# Patient Record
Sex: Female | Born: 1985 | Race: Black or African American | Hispanic: No | Marital: Single | Smoking: Never smoker
Health system: Southern US, Community
[De-identification: ages and names within clinical notes are randomized; demographics above are authoritative.]

## PROBLEM LIST (undated history)

## (undated) DIAGNOSIS — R7989 Other specified abnormal findings of blood chemistry: Secondary | ICD-10-CM

## (undated) DIAGNOSIS — R6250 Unspecified lack of expected normal physiological development in childhood: Secondary | ICD-10-CM

## (undated) DIAGNOSIS — R269 Unspecified abnormalities of gait and mobility: Secondary | ICD-10-CM

## (undated) DIAGNOSIS — Z8673 Personal history of transient ischemic attack (TIA), and cerebral infarction without residual deficits: Secondary | ICD-10-CM

## (undated) DIAGNOSIS — R569 Unspecified convulsions: Secondary | ICD-10-CM

## (undated) HISTORY — DX: Other specified abnormal findings of blood chemistry: R79.89

## (undated) HISTORY — PX: BRAIN SURGERY: SHX531

## (undated) HISTORY — PX: OTHER SURGICAL HISTORY: SHX169

## (undated) HISTORY — DX: Unspecified abnormalities of gait and mobility: R26.9

---

## 2007-03-22 ENCOUNTER — Encounter: Admission: RE | Admit: 2007-03-22 | Discharge: 2007-03-22 | Payer: Self-pay | Admitting: Neurology

## 2010-12-30 ENCOUNTER — Other Ambulatory Visit (HOSPITAL_COMMUNITY)
Admission: RE | Admit: 2010-12-30 | Discharge: 2010-12-30 | Disposition: A | Discharge status: Home / Self Care | Payer: Medicaid Other | Source: Ambulatory Visit | Attending: Obstetrics and Gynecology | Admitting: Obstetrics and Gynecology

## 2010-12-30 DIAGNOSIS — Z01419 Encounter for gynecological examination (general) (routine) without abnormal findings: Secondary | ICD-10-CM | POA: Insufficient documentation

## 2011-04-21 ENCOUNTER — Encounter (HOSPITAL_COMMUNITY): Payer: Self-pay | Admitting: *Deleted

## 2011-04-21 DIAGNOSIS — W57XXXA Bitten or stung by nonvenomous insect and other nonvenomous arthropods, initial encounter: Secondary | ICD-10-CM | POA: Insufficient documentation

## 2011-04-21 DIAGNOSIS — IMO0001 Reserved for inherently not codable concepts without codable children: Secondary | ICD-10-CM | POA: Insufficient documentation

## 2011-04-21 DIAGNOSIS — S40269A Insect bite (nonvenomous) of unspecified shoulder, initial encounter: Secondary | ICD-10-CM | POA: Insufficient documentation

## 2011-04-21 NOTE — ED Notes (Signed)
Removed a tick,

## 2011-04-22 ENCOUNTER — Emergency Department (HOSPITAL_COMMUNITY)
Admission: EM | Admit: 2011-04-22 | Discharge: 2011-04-22 | Disposition: A | Discharge status: Home / Self Care | Payer: Medicaid Other | Attending: Emergency Medicine | Admitting: Emergency Medicine

## 2011-04-22 DIAGNOSIS — W57XXXA Bitten or stung by nonvenomous insect and other nonvenomous arthropods, initial encounter: Secondary | ICD-10-CM

## 2011-04-22 HISTORY — DX: Unspecified convulsions: R56.9

## 2011-04-22 NOTE — Discharge Instructions (Signed)
Put a warm compress on the sore areas 3 times a day; and clean the area well with soap and water at least twice a day. Watch for fever within 2 weeks or rash that develops around the sore area. See a doctor if you have concerns or problems   Wood Tick Bite Ticks are insects that attach themselves to the skin. Most tick bites are harmless, but sometimes ticks carry diseases that can make a person quite ill. The chance of getting ill depends on:  The kind of tick that bites you.   Time of year.   How long the tick is attached.   Geographic location.  Wood ticks are also called dog ticks. They are generally black. They can have white markings. They live in shrubs and grassy areas. They are larger than deer ticks. Wood ticks are about the size of a watermelon seed. They have a hard body. The most common places for ticks to attach themselves are the scalp, neck, armpits, waist, and groin. Wood ticks may stay attached for up to 2 weeks. TICKS MUST BE REMOVED AS SOON AS POSSIBLE TO HELP PREVENT DISEASES CAUSED BY TICK BITES.  TO REMOVE A TICK: 1. If available, put on latex gloves before trying to remove a tick.  2. Grasp the tick as close to the skin as possible, with curved forceps, fine tweezers or a special tick removal tool.  3. Pull gently with steady pressure until the tick lets go. Do not twist the tick or jerk it suddenly. This may break off the tick's head or mouth parts.  4. Do not crush the tick's body. This could force disease-carrying fluids from the tick into your body.  5. After the tick is removed, wash the bite area and your hands with soap and water or other disinfectant.  6. Apply a small amount of antiseptic cream or ointment to the bite site.  7. Wash and disinfect any instruments that were used.  8. Save the tick in a jar or plastic bag for later identification. Preserve the tick with a bit of alcohol or put it in the freezer.  9. Do not apply a hot match, petroleum jelly, or  fingernail polish to the tick. This does not work and may increase the chances of disease from the tick bite.  YOU MAY NEED TO SEE YOUR CAREGIVER FOR A TETANUS SHOT NOW IF:  You have no idea when you had the last one.   You have never had a tetanus shot before.  If you need a tetanus shot, and you decide not to get one, there is a rare chance of getting tetanus. Sickness from tetanus can be serious. If you get a tetanus shot, your arm may swell, get red and warm to the touch at the shot site. This is common and not a problem. PREVENTION  Wear protective clothing. Long sleeves and pants are best.   Wear white clothes to see ticks more easily   Tuck your pant legs into your socks.   If walking on trail, stay in the middle of the trail to avoid brushing against bushes.   Put insect repellent on all exposed skin and along boot tops, pant legs and sleeve cuffs   Check clothing, hair and skin repeatedly and before coming inside.   Brush off any ticks that are not attached.  SEEK MEDICAL CARE IF:   You cannot remove a tick or part of the tick that is left in the skin.  Unexplained fever.   Redness and swelling in the area of the tick bite.   Tender, swollen lymph glands.   Diarrhea.   Weight loss.   Cough.   Fatigue.   Muscle, joint or bone pain.   Belly pain.   Headache.   Rash.  SEEK IMMEDIATE MEDICAL CARE IF:   You develop an oral temperature above 102 F (38.9 C).   You are having trouble walking or moving your legs.   Numbness in the legs.   Shortness of breath.   Confusion.   Repeated vomiting.  Document Released: 12/28/1999 Document Revised: 12/19/2010 Document Reviewed: 12/06/2007 [facility] Patient Information 2012 [place], [place].

## 2011-04-22 NOTE — ED Provider Notes (Signed)
History     CSN: [id number]  Arrival date & time 04/21/11  2210   None     Chief Complaint  Patient presents with  . Tick Removal    (Consider location/radiation/quality/duration/timing/severity/associated sxs/prior treatment) HPI Comments: Vanessa Zuniga is a 26 y.o. Female who presents with her mother, who pulled a tick from her left axilla with tweezers. They're concerned that there might be a retained. Part of the tick in her axilla. The tick bite was within the last 24 hours. There is no associated fever, rash, nausea, vomiting, weakness, dizziness, or back pain.  The history is provided by the patient and a parent.    Past Medical History  Diagnosis Date  . Seizures     Past Surgical History  Procedure Date  . Brain surgery     History reviewed. No pertinent family history.  History  Substance Use Topics  . Smoking status: Never Smoker   . Smokeless tobacco: Not on file  . Alcohol Use: No    OB History    Grav Para Term Preterm Abortions TAB SAB Ect Mult Living                  Review of Systems  Allergies  Sulfa antibiotics  Home Medications  No current outpatient prescriptions on file.  BP 120/59  Pulse 86  Temp(Src) 98 F (36.7 C) (Oral)  Resp 24  Ht 5\' 3"  (1.6 m)  Wt 149 lb (67.586 kg)  BMI 26.39 kg/m2  SpO2 100%  LMP 04/08/2011  Physical Exam  Constitutional: She is oriented to person, place, and time. She appears well-developed.  HENT:  Head: Normocephalic.  Eyes: Conjunctivae are normal. Pupils are equal, round, and reactive to light.  Neck: Normal range of motion. Neck supple.  Pulmonary/Chest: Effort normal.  Musculoskeletal: Normal range of motion.  Neurological: She is alert and oriented to person, place, and time. No cranial nerve deficit. Coordination normal.  Skin:       Left axilla has a 3 mm excoriated area without associated swelling or rash. Centrally in the excoriated area is a miniscule black material of  nonspecific nature.    ED Course  Procedures (including critical care time)  The patient presented with a wood tick in a baggy that appeared to be fully intact. When viewed through 2 times magnification   Labs Reviewed - No data to display No results found.   1. Tick bite       MDM  Take bite, with embedded insect, removed by family member. No evidence for significant retained foreign body. No indication for treatment with antibiotics at this time.   Plan: Home Medications- none; Home Treatments- warm soaks; Recommended follow up- PCP or here prn        Flint Melter, MD 04/22/11 [phone]

## 2012-11-08 ENCOUNTER — Ambulatory Visit: Payer: Self-pay | Admitting: Nurse Practitioner

## 2012-11-12 ENCOUNTER — Encounter (INDEPENDENT_AMBULATORY_CARE_PROVIDER_SITE_OTHER): Payer: Self-pay

## 2012-11-12 ENCOUNTER — Encounter: Payer: Self-pay | Admitting: Nurse Practitioner

## 2012-11-12 ENCOUNTER — Ambulatory Visit (INDEPENDENT_AMBULATORY_CARE_PROVIDER_SITE_OTHER): Payer: Medicaid Other | Admitting: Nurse Practitioner

## 2012-11-12 VITALS — BP 113/77 | HR 74 | Ht 65.0 in | Wt 153.0 lb

## 2012-11-12 DIAGNOSIS — G40209 Localization-related (focal) (partial) symptomatic epilepsy and epileptic syndromes with complex partial seizures, not intractable, without status epilepticus: Secondary | ICD-10-CM

## 2012-11-12 DIAGNOSIS — Z79899 Other long term (current) drug therapy: Secondary | ICD-10-CM

## 2012-11-12 DIAGNOSIS — Z5181 Encounter for therapeutic drug level monitoring: Secondary | ICD-10-CM | POA: Insufficient documentation

## 2012-11-12 MED ORDER — CARBAMAZEPINE ER 200 MG PO CP12: 200.0000 mg | ORAL_CAPSULE | Freq: Two times a day (BID) | ORAL | Status: DC

## 2012-11-12 MED ORDER — LAMOTRIGINE 100 MG PO TABS: 300.0000 mg | ORAL_TABLET | Freq: Two times a day (BID) | ORAL | Status: DC

## 2012-11-12 NOTE — Progress Notes (Signed)
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 She was last seen by Vanessa Zuniga 11/04/11.  The patient is on Lamictal and Equetro  and she has done quite well with her seizures. No new medical issues have come up since last seen. 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 The patient has not had any seizures in 3 to 4 years per mother.   REVIEW OF SYSTEMS: Full 14 system review of systems performed and notable only for:  Constitutional: N/A  Cardiovascular: N/A  Ear/Nose/Throat: N/A  Skin: N/A  Eyes: N/A  Respiratory: N/A  Gastroitestinal: N/A  Hematology/Lymphatic: N/A  Endocrine: N/A Musculoskeletal:N/A  Allergy/Immunology: N/A  Neurological: N/A Psychiatric: N/A   ALLERGIES: Allergies  Allergen Reactions  . Sulfa Antibiotics     HOME MEDICATIONS: No outpatient prescriptions prior to visit.   No facility-administered medications prior to visit.    PAST MEDICAL HISTORY: Past Medical History  Diagnosis Date  . Seizures     PAST SURGICAL HISTORY: Past Surgical History  Procedure Laterality Date  . Brain surgery      FAMILY HISTORY: History reviewed. No pertinent family history.  SOCIAL HISTORY: History   Social History  . Marital Status: Single    Spouse Name: N/A    Number of Children: 0  . Years of Education: 12   Occupational History  . Not on file.   Social History Main Topics  . Smoking status: Never Smoker   . Smokeless tobacco: Never Used  . Alcohol Use: No  . Drug Use: No  . Sexual Activity: No   Other Topics Concern  . Not on file   Social History Narrative   Patient lives with mother Vanessa Zuniga.   Patient has no children.    Patient as a high school education.   Patient is working.    Patient is single     PHYSICAL EXAM  Filed Vitals:   11/12/12 0939  BP: 113/77  Pulse: 74  Height: 5\' 5"  (1.651 m)  Weight: 153 lb (69.4 kg)   Body mass index is 25.46 kg/(m^2).  Generalized: Well developed, in no acute distress   Neurological examination   Mentation: Alert oriented to time, place, history taking. Follows all commands speech and language fluent  Cranial nerve II-XII: .Pupils were equal round reactive to light extraocular movements were full, visual field were full on confrontational test. Facial sensation and strength were normal. hearing was intact to finger rubbing bilaterally. Uvula tongue midline. head turning and shoulder shrug and were normal and symmetric.Tongue protrusion into cheek strength was normal. Motor: normal bulk and tone, full strength in the BUE, BLE, mild residual right hemiparesis  Coordination: finger-nose-finger, heel-to-shin bilaterally, no dysmetria Reflexes: Brisker on the right as compared to the left Gait and Station: Mild circumduction gait, decreased arm swing bil smooth turning, able to perform tiptoe, and heel walking without difficulty. Gait mildly unsteady  DIAGNOSTIC DATA (LABS, IMAGING, TESTING) -None to review   ASSESSMENT AND PLAN  27 y.o. year old female  has a past medical history of Seizures. here to followup. No seizure activity at 3-4 years  Will check labs today, CBC, CMP, CBZ level Continue Equetro and Lamictal. Will renew F/U yearly and prn Vanessa Zuniga, Vanessa Zuniga, [facility], Vanessa Zuniga  [facility] [address] [place], [place] [address] [phone]

## 2012-11-12 NOTE — Patient Instructions (Signed)
Will check labs today Continue Equetro and Lamictal. Will renew F/U yearly and prn

## 2012-11-12 NOTE — Progress Notes (Signed)
I have read the note, and I agree with the clinical assessment and plan.  WILLIS,CHARLES KEITH   

## 2012-11-13 LAB — COMPREHENSIVE METABOLIC PANEL
AST: 26 IU/L (ref 0–40)
Albumin/Globulin Ratio: 1.6 (ref 1.1–2.5)
Albumin: 4.5 g/dL (ref 3.5–5.5)
Alkaline Phosphatase: 151 IU/L — ABNORMAL HIGH (ref 39–117)
BUN/Creatinine Ratio: 11 (ref 8–20)
BUN: 9 mg/dL (ref 6–20)
CO2: 24 mmol/L (ref 18–29)
Creatinine, Ser: 0.8 mg/dL (ref 0.57–1.00)
GFR calc Af Amer: 116 mL/min/{1.73_m2} (ref >59)
GFR calc non Af Amer: 101 mL/min/{1.73_m2} (ref >59)
Globulin, Total: 2.8 g/dL (ref 1.5–4.5)
Sodium: 140 mmol/L (ref 134–144)
Total Bilirubin: <0.2 mg/dL (ref 0.0–1.2)

## 2012-11-15 ENCOUNTER — Telehealth: Payer: Self-pay | Admitting: Nurse Practitioner

## 2012-11-15 ENCOUNTER — Other Ambulatory Visit: Payer: Self-pay

## 2012-11-15 MED ORDER — CARBAMAZEPINE ER 200 MG PO CP12: 200.0000 mg | ORAL_CAPSULE | Freq: Two times a day (BID) | ORAL | Status: DC

## 2012-11-15 NOTE — Telephone Encounter (Signed)
I returned pharmacy call. They stated that patient is to take one in a.m. and two in p.m. But only got 60 capsules. I will have it resubmitted. Also, I did speak with Vella Redhead, NP who confirmed that patient should be taking one capsule in a.m. and two capsules in p.m.

## 2012-11-15 NOTE — Progress Notes (Signed)
Quick Note:  Shared stable labs with patient thru VM message ______

## 2012-11-19 MED ORDER — CARBAMAZEPINE ER 200 MG PO CP12: ORAL_CAPSULE | ORAL | Status: DC

## 2012-11-19 NOTE — Telephone Encounter (Signed)
I will change how the prescription is written in the computer.

## 2012-11-19 NOTE — Addendum Note (Signed)
Addended by: Stephanie Acre on: 11/19/2012 06:02 PM   Modules accepted: Orders

## 2012-12-15 ENCOUNTER — Telehealth: Payer: Self-pay | Admitting: Nurse Practitioner

## 2012-12-15 NOTE — Telephone Encounter (Signed)
Please advise 

## 2012-12-15 NOTE — Telephone Encounter (Signed)
Pharmacy called want to know if Vanessa Zuniga is signed up for the [facility] program, if not she needs to be signed up so patient will be able to get her medications Equetro 200 mg and Lamictal 100 mg.

## 2012-12-16 ENCOUNTER — Telehealth: Payer: Self-pay | Admitting: Nurse Practitioner

## 2012-12-16 MED ORDER — LAMOTRIGINE 100 MG PO TABS: 300.0000 mg | ORAL_TABLET | Freq: Two times a day (BID) | ORAL | Status: DC

## 2012-12-16 MED ORDER — CARBAMAZEPINE ER 200 MG PO CP12: ORAL_CAPSULE | ORAL | Status: DC

## 2012-12-16 NOTE — Telephone Encounter (Signed)
[facility] calling because script for Conrad Glenarden needs to be rewritten. Please call.

## 2012-12-16 NOTE — Telephone Encounter (Signed)
Carolyn's application is still pending.  Rx's resent under Provider.

## 2012-12-17 MED ORDER — CARBAMAZEPINE ER 200 MG PO CP12: ORAL_CAPSULE | ORAL | Status: DC

## 2013-02-01 ENCOUNTER — Ambulatory Visit (INDEPENDENT_AMBULATORY_CARE_PROVIDER_SITE_OTHER): Payer: Medicaid Other | Admitting: Adult Health

## 2013-02-01 ENCOUNTER — Encounter (INDEPENDENT_AMBULATORY_CARE_PROVIDER_SITE_OTHER): Payer: Self-pay

## 2013-02-01 ENCOUNTER — Encounter: Payer: Self-pay | Admitting: Adult Health

## 2013-02-01 ENCOUNTER — Other Ambulatory Visit (HOSPITAL_COMMUNITY)
Admission: RE | Admit: 2013-02-01 | Discharge: 2013-02-01 | Disposition: A | Discharge status: Home / Self Care | Payer: Medicaid Other | Source: Ambulatory Visit | Attending: Obstetrics and Gynecology | Admitting: Obstetrics and Gynecology

## 2013-02-01 VITALS — BP 120/80 | HR 80 | Ht 64.0 in | Wt 153.0 lb

## 2013-02-01 DIAGNOSIS — Z124 Encounter for screening for malignant neoplasm of cervix: Secondary | ICD-10-CM

## 2013-02-01 DIAGNOSIS — Z01419 Encounter for gynecological examination (general) (routine) without abnormal findings: Secondary | ICD-10-CM | POA: Insufficient documentation

## 2013-02-01 DIAGNOSIS — Z0000 Encounter for general adult medical examination without abnormal findings: Secondary | ICD-10-CM

## 2013-02-01 NOTE — Patient Instructions (Signed)
Physical in 2 years Call prn

## 2013-02-01 NOTE — Progress Notes (Signed)
Patient ID: Vanessa Zuniga, female   DOB: Sep 12, 1985, 28 y.o.   MRN: [id number] History of Present Illness: Vanessa Zuniga is a 28 year old black female in for a pap and physical. Has history of seizures, right side affected, sp brain surgery, doing well.  Current Medications, Allergies, Past Medical History, Past Surgical History, Family History and Social History were reviewed in [employer] record.     Review of Systems: Patient denies any headaches, blurred vision, shortness of breath, chest pain, abdominal pain, problems with bowel movements, urination, or periods, she does not have sex, she uses pads..No joint pain or mood swings.She is still on meds for seizures.    Physical Exam:BP 120/80  Pulse 80  Ht 5\' 4"  (1.626 m)  Wt 153 lb (69.4 kg)  BMI 26.25 kg/m2  LMP 01/04/2013 General:  Well developed, well nourished, no acute distress Skin:  Warm and dry Neck:  Midline trachea, normal thyroid Lungs; Clear to auscultation bilaterally Breast:  No dominant palpable mass, retraction, or nipple discharge Cardiovascular: Regular rate and rhythm Abdomen:  Soft, non tender, no hepatosplenomegaly Pelvic:  External genitalia is normal in appearance.  The vagina is normal in appearance. The cervix is nulliparous, pap performed.  Uterus is felt to be normal size, shape, and contour.  No adnexal masses or tenderness noted. Extremities:  No swelling or varicosities noted,  Psych:  No mood changes, alert and cooperative, seems happy, Mom with her   Impression: Yearly gyn exam History of seizures   Plan: Physical in 2 year Call prn problems

## 2013-11-14 ENCOUNTER — Ambulatory Visit: Payer: Medicaid Other | Admitting: Nurse Practitioner

## 2013-12-19 ENCOUNTER — Other Ambulatory Visit: Payer: Self-pay | Admitting: Neurology

## 2014-01-03 ENCOUNTER — Ambulatory Visit (INDEPENDENT_AMBULATORY_CARE_PROVIDER_SITE_OTHER): Payer: Medicaid Other | Admitting: Nurse Practitioner

## 2014-01-03 ENCOUNTER — Encounter: Payer: Self-pay | Admitting: Nurse Practitioner

## 2014-01-03 VITALS — BP 122/83 | HR 89 | Ht 61.0 in | Wt 155.0 lb

## 2014-01-03 DIAGNOSIS — Z5181 Encounter for therapeutic drug level monitoring: Secondary | ICD-10-CM

## 2014-01-03 DIAGNOSIS — G40209 Localization-related (focal) (partial) symptomatic epilepsy and epileptic syndromes with complex partial seizures, not intractable, without status epilepticus: Secondary | ICD-10-CM

## 2014-01-03 MED ORDER — LAMOTRIGINE 100 MG PO TABS: ORAL_TABLET | ORAL | Status: DC

## 2014-01-03 MED ORDER — CARBAMAZEPINE ER 200 MG PO CP12: ORAL_CAPSULE | ORAL | Status: DC

## 2014-01-03 NOTE — Patient Instructions (Signed)
Check labs today Will refill Lamictal and Equetro for the next year Follow-up yearly and when necessary

## 2014-01-03 NOTE — Progress Notes (Addendum)
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 She was last seen 11/12/12.  The patient is on Lamictal and Equetro and she has done quite well with her seizures. No new medical issues have come up since last seen. 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 The patient had a mild seizure according to the mom back in the summer. Otherwise no seizures in 3 to 4 years per mother.  REVIEW OF SYSTEMS: Full 14 system review of systems performed and notable only for those listed, all others are neg:  Constitutional: N/A  Cardiovascular: N/A  Ear/Nose/Throat: N/A  Skin: N/A  Eyes: N/A  Respiratory: N/A  Gastroitestinal: N/A  Hematology/Lymphatic: N/A  Endocrine: N/A Musculoskeletal:N/A  Allergy/Immunology: N/A  Neurological: seizure Psychiatric: N/A Sleep : NA   ALLERGIES: Allergies  Allergen Reactions  . Sulfa Antibiotics     HOME MEDICATIONS: Outpatient Prescriptions Prior to Visit  Medication Sig Dispense Refill  . EQUETRO 200 MG CP12 12 hr capsule TAKE ONE CAPSULE IN THE MORNING AND TWO CAPSULES IN THE EVENING 90 each 0  . lamoTRIgine (LAMICTAL) 100 MG tablet TAKE 3 TABLETS IN THE MORNING AND 3 TABLETS IN THE EVENING 180 tablet 0  . Multiple Vitamin (MULTIVITAMIN) tablet Take 1 tablet by mouth daily.     No facility-administered medications prior to visit.    PAST MEDICAL HISTORY: Past Medical History  Diagnosis Date  . Seizures     PAST SURGICAL HISTORY: Past Surgical History  Procedure Laterality Date  . Brain surgery      FAMILY HISTORY: Family History  Problem Relation Age of Onset  . Cancer Maternal Aunt     colon   . Asthma Maternal Grandmother   . Diabetes Maternal Grandmother   .  Heart disease Maternal Grandfather   . Cancer Paternal Grandmother     lung    SOCIAL HISTORY: History   Social History  . Marital Status: Single    Spouse Name: N/A    Number of Children: 0  . Years of Education: 12   Occupational History  . Not on file.   Social History Main Topics  . Smoking status: Never Smoker   . Smokeless tobacco: Never Used  . Alcohol Use: No  . Drug Use: No  . Sexual Activity: No   Other Topics Concern  . Not on file   Social History Narrative   Patient lives with mother [name].   Patient has no children.    Patient as a high school education.   Patient is working.    Patient is single   Patient is left handed.   Patient does not drink caffeine.     PHYSICAL EXAM  Filed Vitals:   01/03/14 1526  BP: 122/83  Pulse: 89  Height: 5\' 1"  (1.549 m)  Weight: 155 lb (70.308 kg)   Body mass index is 29.3 kg/(m^2). Generalized: Well developed, in no acute distress   Neurological examination   Mentation: Alert oriented to time, place, history taking. Follows all commands speech and language fluent  Cranial nerve II-XII: .Pupils were equal round reactive to light extraocular movements were full, visual field were full on confrontational test. Facial sensation and strength were normal. hearing was intact to finger rubbing bilaterally. Uvula  tongue midline. head turning and shoulder shrug and were normal and symmetric.Tongue protrusion into cheek strength was normal. Motor: normal bulk and tone, full strength in the BUE, BLE, mild residual right hemiparesis  Coordination: finger-nose-finger, heel-to-shin bilaterally, no dysmetria Reflexes: Brisker on the right as compared to the left Gait and Station: Mild circumduction gait, decreased arm swing bil smooth turning, able to perform tiptoe, and heel walking without difficulty. Gait mildly unsteady DIAGNOSTIC DATA (LABS, IMAGING, TESTING) - I reviewed patient records, labs, notes, testing and  imaging myself where available.      Component Value Date/Time   NA 140 11/12/2012 1025   K 4.1 11/12/2012 1025   CL 96* 11/12/2012 1025   CO2 24 11/12/2012 1025   GLUCOSE 77 11/12/2012 1025   BUN 9 11/12/2012 1025   CREATININE 0.80 11/12/2012 1025   CALCIUM 9.7 11/12/2012 1025   PROT 7.3 11/12/2012 1025   AST 26 11/12/2012 1025   ALT 23 11/12/2012 1025   ALKPHOS 151* 11/12/2012 1025   BILITOT <0.2 11/12/2012 1025   GFRNONAA 101 11/12/2012 1025   GFRAA 116 11/12/2012 1025    ASSESSMENT AND PLAN  28 y.o. year old female  has a past medical history of Seizures. here to follow-up.The patient is a current patient of Dr. Jannifer Franklin  who is out of the office today . This note is sent to the work in doctor.     Check labs today, CBC, CMP, Lamictal and carbamazepine levels Will refill Lamictal and Equetro for the next year Follow-up yearly and when necessary Dennie Bible, [facility], [facility], APRN  [facility] [address] [place], [place] [address] [phone]  Personally examined patient and images, agree with history, physical, neuro exam as stated above. Agree with assessment and plan.   Sarina Ill, MD [facility]

## 2014-01-04 LAB — CBC WITH DIFFERENTIAL/PLATELET
Basophils Absolute: 0 10*3/uL (ref 0.0–0.2)
Basos: 1 %
EOS: 2 %
Eosinophils Absolute: 0.1 10*3/uL (ref 0.0–0.4)
HEMATOCRIT: 36.9 % (ref 34.0–46.6)
HEMOGLOBIN: 11.5 g/dL (ref 11.1–15.9)
IMMATURE GRANULOCYTES: 0 %
Immature Grans (Abs): 0 10*3/uL (ref 0.0–0.1)
LYMPHS ABS: 2.2 10*3/uL (ref 0.7–3.1)
Lymphs: 34 %
MCH: 26.4 pg — ABNORMAL LOW (ref 26.6–33.0)
MCHC: 31.2 g/dL — ABNORMAL LOW (ref 31.5–35.7)
MCV: 85 fL (ref 79–97)
MONOCYTES: 9 %
Monocytes Absolute: 0.6 10*3/uL (ref 0.1–0.9)
NEUTROS ABS: 3.7 10*3/uL (ref 1.4–7.0)
Neutrophils Relative %: 54 %
RBC: 4.35 x10E6/uL (ref 3.77–5.28)
RDW: 14.6 % (ref 12.3–15.4)
WBC: 6.6 10*3/uL (ref 3.4–10.8)

## 2014-01-05 LAB — COMPREHENSIVE METABOLIC PANEL
A/G RATIO: 1.5 (ref 1.1–2.5)
ALT: 22 IU/L (ref 0–32)
AST: 21 IU/L (ref 0–40)
Albumin: 4.4 g/dL (ref 3.5–5.5)
Alkaline Phosphatase: 149 IU/L — ABNORMAL HIGH (ref 39–117)
BUN / CREAT RATIO: 10 (ref 8–20)
BUN: 8 mg/dL (ref 6–20)
CALCIUM: 9.5 mg/dL (ref 8.7–10.2)
CHLORIDE: 97 mmol/L (ref 97–108)
CO2: 27 mmol/L (ref 18–29)
CREATININE: 0.84 mg/dL (ref 0.57–1.00)
GFR calc Af Amer: 109 mL/min/{1.73_m2} (ref >59)
GFR calc non Af Amer: 94 mL/min/{1.73_m2} (ref >59)
GLUCOSE: 90 mg/dL (ref 65–99)
Globulin, Total: 2.9 g/dL (ref 1.5–4.5)
POTASSIUM: 4.2 mmol/L (ref 3.5–5.2)
SODIUM: 137 mmol/L (ref 134–144)
Total Protein: 7.3 g/dL (ref 6.0–8.5)

## 2014-01-05 LAB — LAMOTRIGINE LEVEL: Lamotrigine Lvl: 12.8 ug/mL (ref 2.0–20.0)

## 2014-01-05 LAB — CARBAMAZEPINE LEVEL, TOTAL: CARBAMAZEPINE LVL: 7.6 ug/mL (ref 4.0–12.0)

## 2014-01-09 ENCOUNTER — Encounter: Payer: Self-pay | Admitting: *Deleted

## 2014-03-02 ENCOUNTER — Ambulatory Visit (INDEPENDENT_AMBULATORY_CARE_PROVIDER_SITE_OTHER): Payer: Medicaid Other | Admitting: Otolaryngology

## 2014-03-02 DIAGNOSIS — D3709 Neoplasm of uncertain behavior of other specified sites of the oral cavity: Secondary | ICD-10-CM

## 2014-04-13 ENCOUNTER — Ambulatory Visit (INDEPENDENT_AMBULATORY_CARE_PROVIDER_SITE_OTHER): Payer: Medicaid Other | Admitting: Otolaryngology

## 2014-04-13 DIAGNOSIS — D3701 Neoplasm of uncertain behavior of lip: Secondary | ICD-10-CM | POA: Diagnosis not present

## 2014-04-14 ENCOUNTER — Other Ambulatory Visit: Payer: Self-pay | Admitting: Otolaryngology

## 2014-04-24 ENCOUNTER — Encounter (HOSPITAL_BASED_OUTPATIENT_CLINIC_OR_DEPARTMENT_OTHER): Payer: Self-pay | Admitting: *Deleted

## 2014-05-01 ENCOUNTER — Encounter (HOSPITAL_BASED_OUTPATIENT_CLINIC_OR_DEPARTMENT_OTHER)
Admission: RE | Disposition: A | Discharge status: Home / Self Care | Payer: Self-pay | Source: Ambulatory Visit | Attending: Otolaryngology

## 2014-05-01 ENCOUNTER — Ambulatory Visit (HOSPITAL_BASED_OUTPATIENT_CLINIC_OR_DEPARTMENT_OTHER)
Admission: RE | Admit: 2014-05-01 | Discharge: 2014-05-01 | Disposition: A | Discharge status: Home / Self Care | Payer: Medicaid Other | Source: Ambulatory Visit | Attending: Otolaryngology | Admitting: Otolaryngology

## 2014-05-01 ENCOUNTER — Encounter (HOSPITAL_BASED_OUTPATIENT_CLINIC_OR_DEPARTMENT_OTHER): Payer: Self-pay

## 2014-05-01 DIAGNOSIS — K130 Diseases of lips: Secondary | ICD-10-CM | POA: Diagnosis present

## 2014-05-01 DIAGNOSIS — D3701 Neoplasm of uncertain behavior of lip: Secondary | ICD-10-CM | POA: Diagnosis not present

## 2014-05-01 HISTORY — PX: MINOR EXCISION OF ORAL LESION: SHX6466

## 2014-05-01 HISTORY — DX: Unspecified lack of expected normal physiological development in childhood: R62.50

## 2014-05-01 HISTORY — DX: Personal history of transient ischemic attack (TIA), and cerebral infarction without residual deficits: Z86.73

## 2014-05-01 SURGERY — MINOR EXCISION OF ORAL LESION
Anesthesia: LOCAL | Site: Mouth

## 2014-05-01 MED ORDER — LIDOCAINE-EPINEPHRINE 1 %-1:100000 IJ SOLN
INTRAMUSCULAR | Status: AC
  Filled 2014-05-01: qty 1

## 2014-05-01 MED ORDER — AMOXICILLIN 875 MG PO TABS: 875.0000 mg | ORAL_TABLET | Freq: Two times a day (BID) | ORAL | Status: DC

## 2014-05-01 MED ORDER — LIDOCAINE-EPINEPHRINE 1 %-1:100000 IJ SOLN
INTRAMUSCULAR | Status: DC | PRN
  Administered 2014-05-01: .5 mL

## 2014-05-01 MED ORDER — BACITRACIN 500 UNIT/GM EX OINT
TOPICAL_OINTMENT | CUTANEOUS | Status: DC | PRN
  Administered 2014-05-01: 1 via TOPICAL

## 2014-05-01 MED ORDER — OXYCODONE-ACETAMINOPHEN 5-325 MG PO TABS: 1.0000 | ORAL_TABLET | ORAL | Status: DC | PRN

## 2014-05-01 SURGICAL SUPPLY — 34 items
BLADE SURG 15 STRL LF DISP TIS (BLADE) ×1 IMPLANT
BLADE SURG 15 STRL SS (BLADE) ×2
CANISTER SUCT 1200ML W/VALVE (MISCELLANEOUS) ×2 IMPLANT
COVER MAYO STAND STRL (DRAPES) ×2 IMPLANT
DECANTER SPIKE VIAL GLASS SM (MISCELLANEOUS) IMPLANT
DEPRESSOR TONGUE BLADE STERILE (MISCELLANEOUS) IMPLANT
ELECT COATED BLADE 2.86 ST (ELECTRODE) IMPLANT
ELECT NDL BLADE 2-5/6 (NEEDLE) IMPLANT
ELECT NEEDLE BLADE 2-5/6 (NEEDLE) ×2 IMPLANT
ELECT REM PT RETURN 9FT ADLT (ELECTROSURGICAL) ×2
ELECTRODE REM PT RTRN 9FT ADLT (ELECTROSURGICAL) IMPLANT
GLOVE BIO SURGEON STRL SZ7.5 (GLOVE) ×2 IMPLANT
GLOVE SURG SS PI 7.0 STRL IVOR (GLOVE) ×1 IMPLANT
GOWN STRL REUS W/ TWL LRG LVL3 (GOWN DISPOSABLE) ×1 IMPLANT
GOWN STRL REUS W/TWL LRG LVL3 (GOWN DISPOSABLE) ×2
NDL PRECISIONGLIDE 27X1.5 (NEEDLE) ×1 IMPLANT
NEEDLE PRECISIONGLIDE 27X1.5 (NEEDLE) ×2 IMPLANT
PACK BASIN DAY SURGERY FS (CUSTOM PROCEDURE TRAY) ×2 IMPLANT
PENCIL BUTTON HOLSTER BLD 10FT (ELECTRODE) ×2 IMPLANT
PUNCH BIOPSY DERMAL 2MM (MISCELLANEOUS) IMPLANT
PUNCH BIOPSY DERMAL 3MM (MISCELLANEOUS) IMPLANT
PUNCH BIOPSY DERMAL 4MM (MISCELLANEOUS) IMPLANT
PUNCH BIOPSY DERMAL 5MM STRL (MISCELLANEOUS) IMPLANT
SHEET MEDIUM DRAPE 40X70 STRL (DRAPES) ×2 IMPLANT
SUCTION FRAZIER TIP 10 FR DISP (SUCTIONS) ×1 IMPLANT
SUT CHROMIC 5 0 RB 1 27 (SUTURE) IMPLANT
SUT SILK 3 0 TIES 17X18 (SUTURE)
SUT SILK 3-0 18XBRD TIE BLK (SUTURE) IMPLANT
SUT VIC AB 4-0 RB1 27 (SUTURE) ×2
SUT VIC AB 4-0 RB1 27X BRD (SUTURE) IMPLANT
SYR CONTROL 10ML LL (SYRINGE) ×2 IMPLANT
TOWEL OR 17X24 6PK STRL BLUE (TOWEL DISPOSABLE) ×2 IMPLANT
TUBE CONNECTING 20X1/4 (TUBING) ×2 IMPLANT
YANKAUER SUCT BULB TIP NO VENT (SUCTIONS) ×1 IMPLANT

## 2014-05-01 NOTE — H&P (Signed)
Cc: Lip mass  HPI: The patient is a 29 year old female who returns today with her mother.  The patient was previously noted to have benign appearing lesion on her lower lip.  The decision at that time was to proceed with conservative observation. The patient returns complaining of slight increase in the size of her lip lesion.  She denies any tenderness or drainage from the lesion. She is tolerating oral intake well. No other ENT, GI, or respiratory issue noted since the last visit.   Exam: General: Communicates without difficulty, well nourished, no acute distress. Head:  Normocephalic, no lesions or asymmetry. Eyes: PERRL, EOMI. No scleral icterus, conjunctivae clear.  Neuro: CN II exam reveals vision grossly intact.  No nystagmus at any point of gaze. Ears:  EAC normal without erythema AU.  TM intact without fluid and mobile AU. Nose: Moist, pink mucosa without lesions or mass. Oral:  Oral cavity and oropharynx are intact, symmetric, without erythema or edema.  Mucosa is moist. A 1cm soft tissue lesion is noted on the inside of the lower lip.  No ulceration is noted. Neck: Full range of motion without pain.  There is no significant lymphadenopathy.  No masses palpable.  Thyroid bed within normal limits to palpation.  Parotid glands and submandibular glands equal bilaterally without mass.  Trachea is midline. Neuro:  CN 2-12 grossly intact. Gait normal. Vestibular: No nystagmus at any point of gaze. The cerebellar examination is unremarkable.   Assessment The patient continues to have benign appearing lesion on the inside of her right lower lip.  The size of the lesion has slightly increased.  Currently it is approximately 1 cm in size.    Plan  1.  The physical exam findings are reviewed with the patient and her mother.  2.  The option of surgical excision versus conservative observation is again discussed.  The pros and cons of the treatment options are reviewed.  3.  The patient would like to  procced with the surgical excision.  We will schedule the procedure in accordance with the patient's schedule.

## 2014-05-01 NOTE — Op Note (Signed)
DATE OF PROCEDURE:  05/01/2014                              OPERATIVE REPORT  SURGEON:  Leta Baptist, MD  PREOPERATIVE DIAGNOSES: 1. Right lower lip mass  POSTOPERATIVE DIAGNOSES: 1. Right lower lip mass  PROCEDURE PERFORMED: 1) V-wedge excision of right lower lip mass       ANESTHESIA:  Local anesthesia with 1% lidocaine with 1-100,000 epinephrine  COMPLICATIONS:  None.  ESTIMATED BLOOD LOSS:  Minimal.  INDICATION FOR PROCEDURE:   Vanessa Zuniga is a 29 y.o. female with a history of a gradually enlarging right lower lip mass. The patient would like to have the mass removed. The risks, benefits, alternatives, and details of the procedure were discussed with the patient and her mother. Questions were invited and answered.  Informed consent was obtained.  DESCRIPTION:  The patient was taken to the operating room and placed supine on the operating table. Local anesthesia with 1% lidocaine with 1-100,000 epinephrine was infiltrated around the right lower lip mass. The mass was approximately 1 cm in size. A V-wedge excision was made around the right lower lip mass. The entire mass was sent to the pathology department for permanent histologic identification. The surgical site was copiously irrigated. Hemostasis was achieved with Bovie electrocautery. The incision was closed with interrupted 4-0 Vicryl sutures. The patient tolerated the procedure well.  OPERATIVE FINDINGS:  A 1 cm right lower lip mass was noted.  SPECIMEN:  Right lower lip mass.  FOLLOWUP CARE:  The patient will be discharged home once she is awake and alert. She will be placed on amoxicillin by mouth twice a day for 5 days, and Percocet when necessary for pain.  [name] 05/01/2014

## 2014-05-02 ENCOUNTER — Encounter (HOSPITAL_BASED_OUTPATIENT_CLINIC_OR_DEPARTMENT_OTHER): Payer: Self-pay | Admitting: Otolaryngology

## 2014-05-11 ENCOUNTER — Ambulatory Visit (INDEPENDENT_AMBULATORY_CARE_PROVIDER_SITE_OTHER): Payer: Medicaid Other | Admitting: Otolaryngology

## 2014-08-22 ENCOUNTER — Telehealth: Payer: Self-pay | Admitting: Nurse Practitioner

## 2014-08-22 ENCOUNTER — Telehealth: Payer: Self-pay | Admitting: Neurology

## 2014-08-22 MED ORDER — CARBAMAZEPINE ER 200 MG PO TB12: 200.0000 mg | ORAL_TABLET | Freq: Every morning | ORAL | Status: DC

## 2014-08-22 NOTE — Telephone Encounter (Signed)
yes

## 2014-08-22 NOTE — Telephone Encounter (Signed)
Rx updated and sent.  Pharmacy has confirmed receipt.   I attempted to reach patient on both home and cell, but got no answer on either line.  Unable to leave message.  The pharmacy indicates they will discuss change with the patient.

## 2014-08-22 NOTE — Telephone Encounter (Signed)
Vanessa Zuniga with [facility] called regarding refill for carbamazepine (EQUETRO) 200 MG CP12 12 hr capsule . It is not available by manufacturer and is inquiring if it  can be substituted with Tegretol SR or ER. He can be reached at [phone].

## 2014-08-22 NOTE — Telephone Encounter (Signed)
Pharmacy is not able to get Equetro 12 hour capsules in stock.  It is on long term back order with no release date.  They are questioning if patient can be changed to Tegretol XR tablets instead.  Please advise.  Thank you.

## 2014-08-31 NOTE — Telephone Encounter (Signed)
error 

## 2014-12-12 ENCOUNTER — Telehealth: Payer: Self-pay | Admitting: Nurse Practitioner

## 2014-12-12 MED ORDER — CARBAMAZEPINE ER 200 MG PO TB12: 200.0000 mg | ORAL_TABLET | Freq: Every morning | ORAL | Status: DC

## 2014-12-12 NOTE — Telephone Encounter (Signed)
[facility] [phone] called to request prescription for Brand Name Tegretol XR 200

## 2014-12-12 NOTE — Telephone Encounter (Signed)
It appears 6 refills were sent in August.  I called back to clarify.  Spoke with pharmacist.  She said Medicaid is requesting a new Rx because their formulary changed again.  Rx has been sent.  Receipt confirmed by pharmacy.

## 2015-01-04 ENCOUNTER — Ambulatory Visit: Payer: Medicaid Other | Admitting: Nurse Practitioner

## 2015-01-09 ENCOUNTER — Encounter: Payer: Self-pay | Admitting: Nurse Practitioner

## 2015-01-09 ENCOUNTER — Other Ambulatory Visit: Payer: Self-pay | Admitting: Nurse Practitioner

## 2015-01-09 ENCOUNTER — Ambulatory Visit (INDEPENDENT_AMBULATORY_CARE_PROVIDER_SITE_OTHER): Payer: Medicaid Other | Admitting: Nurse Practitioner

## 2015-01-09 VITALS — BP 115/74 | HR 88 | Ht 61.0 in | Wt 160.4 lb

## 2015-01-09 DIAGNOSIS — G40209 Localization-related (focal) (partial) symptomatic epilepsy and epileptic syndromes with complex partial seizures, not intractable, without status epilepticus: Secondary | ICD-10-CM

## 2015-01-09 DIAGNOSIS — Z5181 Encounter for therapeutic drug level monitoring: Secondary | ICD-10-CM

## 2015-01-09 MED ORDER — LAMOTRIGINE 100 MG PO TABS: ORAL_TABLET | ORAL | Status: DC

## 2015-01-09 MED ORDER — CARBAMAZEPINE ER 200 MG PO TB12: 200.0000 mg | ORAL_TABLET | Freq: Every morning | ORAL | Status: DC

## 2015-01-09 NOTE — Patient Instructions (Signed)
Check labs today, CBC, CMP, Lamictal and carbamazepine levels Will refill Lamictal and Equetro for the next year Follow-up yearly and when necessary

## 2015-01-09 NOTE — Progress Notes (Signed)
[facility]  PATIENT: Vanessa Zuniga DOB: Nov 20, 1985   REASON FOR VISIT: follow up for epilepsy HISTORY FROM:patient    HISTORY OF PRESENT ILLNESS:Vanessa Zuniga 29 year-old left-handed black female with a history of a left brain stroke in the distant past, with resultant seizures returns for followup. She was last seen 01/03/14.  The patient is on Lamictal and Equetro and she has done quite well with her seizures. No new medical issues have come up since last seen. 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 No seizure since last seen, she returns for reevaluation, blood work and refills   REVIEW OF SYSTEMS: Full 14 system review of systems performed and notable only for those listed, all others are neg:  Constitutional: neg  Cardiovascular: neg Ear/Nose/Throat: neg  Skin: neg Eyes: neg Respiratory: neg Gastroitestinal: neg  Hematology/Lymphatic: neg  Endocrine: neg Musculoskeletal:neg Allergy/Immunology: neg Neurological: neg Psychiatric: neg Sleep : neg   ALLERGIES: Allergies  Allergen Reactions  . Sulfa Antibiotics Hives    HOME MEDICATIONS: Outpatient Prescriptions Prior to Visit  Medication Sig Dispense Refill  . carbamazepine (TEGRETOL XR) 200 MG 12 hr tablet Take 1 tablet (200 mg total) by mouth every morning. And 2 tablets (400mg ) every evening 90 tablet 3  . lamoTRIgine (LAMICTAL) 100 MG tablet TAKE 3 TABLETS IN THE MORNING AND 3 TABLETS IN THE EVENING 180 tablet 11  . Multiple Vitamin (MULTIVITAMIN) tablet Take 1 tablet by mouth daily.    Marland Kitchen oxyCODONE-acetaminophen (ROXICET) 5-325 MG per tablet Take 1 tablet by mouth every 4 (four) hours as needed for moderate pain or severe pain. 15 tablet 0  . amoxicillin (AMOXIL) 875 MG tablet Take 1 tablet (875 mg total) by mouth 2 (two) times daily. (Patient not taking: Reported on 01/09/2015) 10 tablet 0   No facility-administered medications prior to visit.    PAST MEDICAL  HISTORY: Past Medical History  Diagnosis Date  . Seizures ([place])     last seizure 06/2014  . History of stroke     age 29  . Developmental delay     mental age of 29, per mother    PAST SURGICAL HISTORY: Past Surgical History  Procedure Laterality Date  . Brain surgery    . Minor excision of oral lesion N/A 05/01/2014    Procedure: EXCISION LIP MASS;  Surgeon: Leta Baptist, MD;  Location: [facility];  Service: ENT;  Laterality: N/A;    FAMILY HISTORY: Family History  Problem Relation Age of Onset  . Cancer Maternal Aunt     colon   . Asthma Maternal Grandmother   . Diabetes Maternal Grandmother   . Heart disease Maternal Grandfather   . Cancer Paternal Grandmother     lung    SOCIAL HISTORY: Social History   Social History  . Marital Status: Single    Spouse Name: N/A  . Number of Children: 0  . Years of Education: 12   Occupational History  . Not on file.   Social History Main Topics  . Smoking status: Never Smoker   . Smokeless tobacco: Never Used  . Alcohol Use: No  . Drug Use: No  . Sexual Activity: No   Other Topics Concern  . Not on file   Social History Narrative   Patient lives with mother [name].   Patient has no children.    Patient as a high school education.   Patient is working.    Patient is single   Patient  is left handed.   Patient does not drink caffeine.     PHYSICAL EXAM  Filed Vitals:   01/09/15 1502  BP: 115/74  Pulse: 88  Height: 5\' 1"  (1.549 m)  Weight: 160 lb 6.4 oz (72.757 kg)   Body mass index is 30.32 kg/(m^2). Generalized: Well developed, in no acute distress   Neurological examination   Mentation: Alert oriented to time, place, history taking. Follows all commands speech and language fluent  Cranial nerve II-XII: .Pupils were equal round reactive to light extraocular movements were full, visual field were full on confrontational test. Facial sensation and strength were normal. hearing was intact to  finger rubbing bilaterally. Uvula tongue midline. head turning and shoulder shrug and were normal and symmetric.Tongue protrusion into cheek strength was normal. Motor: normal bulk and tone, full strength in the BUE, BLE, except mild residual right hemiparesis  Coordination: finger-nose-finger, heel-to-shin bilaterally, no dysmetria Reflexes: Brisker on the right as compared to the left Gait and Station: Mild circumduction gait, decreased arm swing bil smooth turning, able to perform tiptoe, and heel walking without difficulty. Gait mildly unsteady DIAGNOSTIC DATA (LABS, IMAGING, TESTING) -  ASSESSMENT AND PLAN  29 y.o. year old female  has a past medical history of Seizures ([place]); History of stroke; and Developmental delay. here to follow-up for seizure disorder.The patient is a current patient of Dr. Jannifer Franklin who is out of the office today . This note is sent to the work in doctor.     Check labs today, CBC, CMP, Lamictal and carbamazepine levels Will refill Lamictal and Equetro for the next year Follow-up yearly and when necessary Dennie Bible, [facility], [facility], [facility] [address], [place], [place] [address] [phone]

## 2015-01-10 LAB — COMPREHENSIVE METABOLIC PANEL
A/G RATIO: 1.6 (ref 1.1–2.5)
ALT: 36 IU/L — AB (ref 0–32)
AST: 31 IU/L (ref 0–40)
Albumin: 4.4 g/dL (ref 3.5–5.5)
Alkaline Phosphatase: 150 IU/L — ABNORMAL HIGH (ref 39–117)
BUN/Creatinine Ratio: 14 (ref 8–20)
BUN: 11 mg/dL (ref 6–20)
Bilirubin Total: <0.2 mg/dL (ref 0.0–1.2)
CALCIUM: 9.7 mg/dL (ref 8.7–10.2)
CHLORIDE: 100 mmol/L (ref 96–106)
CO2: 24 mmol/L (ref 18–29)
Creatinine, Ser: 0.78 mg/dL (ref 0.57–1.00)
GFR, EST AFRICAN AMERICAN: 118 mL/min/{1.73_m2} (ref >59)
GFR, EST NON AFRICAN AMERICAN: 102 mL/min/{1.73_m2} (ref >59)
GLUCOSE: 86 mg/dL (ref 65–99)
Globulin, Total: 2.7 g/dL (ref 1.5–4.5)
POTASSIUM: 4.1 mmol/L (ref 3.5–5.2)
Sodium: 142 mmol/L (ref 134–144)
TOTAL PROTEIN: 7.1 g/dL (ref 6.0–8.5)

## 2015-01-10 LAB — CBC WITH DIFFERENTIAL/PLATELET
BASOS ABS: 0 10*3/uL (ref 0.0–0.2)
BASOS: 0 %
EOS (ABSOLUTE): 0.1 10*3/uL (ref 0.0–0.4)
Eos: 2 %
HEMOGLOBIN: 11.5 g/dL (ref 11.1–15.9)
Hematocrit: 34.7 % (ref 34.0–46.6)
IMMATURE GRANS (ABS): 0 10*3/uL (ref 0.0–0.1)
IMMATURE GRANULOCYTES: 0 %
LYMPHS: 34 %
Lymphocytes Absolute: 2.1 10*3/uL (ref 0.7–3.1)
MCH: 27.6 pg (ref 26.6–33.0)
MCHC: 33.1 g/dL (ref 31.5–35.7)
MCV: 83 fL (ref 79–97)
MONOCYTES: 11 %
Monocytes Absolute: 0.7 10*3/uL (ref 0.1–0.9)
NEUTROS PCT: 53 %
Neutrophils Absolute: 3.4 10*3/uL (ref 1.4–7.0)
PLATELETS: 326 10*3/uL (ref 150–379)
RBC: 4.17 x10E6/uL (ref 3.77–5.28)
RDW: 12.9 % (ref 12.3–15.4)
WBC: 6.3 10*3/uL (ref 3.4–10.8)

## 2015-01-10 LAB — LAMOTRIGINE LEVEL: LAMOTRIGINE LVL: 11 ug/mL (ref 2.0–20.0)

## 2015-01-10 LAB — CARBAMAZEPINE LEVEL, TOTAL: Carbamazepine (Tegretol), S: 7.5 ug/mL (ref 4.0–12.0)

## 2015-01-10 NOTE — Progress Notes (Signed)
I agree with the assessment and plan as directed by NP .The patient is known to Gillermina Hu, MD

## 2015-01-11 ENCOUNTER — Telehealth: Payer: Self-pay | Admitting: *Deleted

## 2015-01-11 NOTE — Telephone Encounter (Signed)
LMVM [employer] (mother) that Vanessa Zuniga's labs normal.  She is to call back if questions.

## 2015-01-11 NOTE — Telephone Encounter (Signed)
-----   Message from Dennie Bible, NP sent at 01/10/2015  4:59 PM EST ----- Please call normal labs

## 2015-02-05 ENCOUNTER — Other Ambulatory Visit (HOSPITAL_COMMUNITY)
Admission: RE | Admit: 2015-02-05 | Discharge: 2015-02-05 | Disposition: A | Discharge status: Home / Self Care | Payer: Medicaid Other | Source: Ambulatory Visit | Attending: Adult Health | Admitting: Adult Health

## 2015-02-05 ENCOUNTER — Ambulatory Visit (INDEPENDENT_AMBULATORY_CARE_PROVIDER_SITE_OTHER): Payer: Medicaid Other | Admitting: Adult Health

## 2015-02-05 ENCOUNTER — Encounter: Payer: Self-pay | Admitting: Adult Health

## 2015-02-05 VITALS — BP 120/80 | HR 88 | Ht 64.0 in | Wt 157.5 lb

## 2015-02-05 DIAGNOSIS — Z124 Encounter for screening for malignant neoplasm of cervix: Secondary | ICD-10-CM

## 2015-02-05 DIAGNOSIS — Z01419 Encounter for gynecological examination (general) (routine) without abnormal findings: Secondary | ICD-10-CM | POA: Diagnosis not present

## 2015-02-05 DIAGNOSIS — Z1151 Encounter for screening for human papillomavirus (HPV): Secondary | ICD-10-CM | POA: Diagnosis not present

## 2015-02-05 DIAGNOSIS — Z0000 Encounter for general adult medical examination without abnormal findings: Secondary | ICD-10-CM | POA: Diagnosis not present

## 2015-02-05 NOTE — Patient Instructions (Signed)
Physical in 1 year Pap in 3 if normal 

## 2015-02-05 NOTE — Progress Notes (Signed)
Patient ID: Vanessa Zuniga, female   DOB: [birth year], 30 y.o.   MRN: [id number] History of Present Illness: Vanessa Zuniga is a 30 year old black female with history of seizures, in for well woman gyn exam and pap.No complaints.She sees [facility].  PCP is Dr Karie Kirks.  Current Medications, Allergies, Past Medical History, Past Surgical History, Family History and Social History were reviewed in [employer] record.     Review of Systems: Patient denies any headaches, hearing loss, fatigue, blurred vision, shortness of breath, chest pain, abdominal pain, problems with bowel movements, urination, or intercourse(never had sex). No joint pain or mood swings.    Physical Exam:BP 120/80 mmHg  Pulse 88  Ht 5\' 4"  (1.626 m)  Wt 157 lb 8 oz (71.442 kg)  BMI 27.02 kg/m2  LMP 01/16/2015 General:  Well developed, well nourished, no acute distress Skin:  Warm and dry Neck:  Midline trachea, normal thyroid, good ROM, no lymphadenopathy Lungs; Clear to auscultation bilaterally Breast:  No dominant palpable mass, retraction, or nipple discharge Cardiovascular: Regular rate and rhythm Abdomen:  Soft, non tender, no hepatosplenomegaly Pelvic:  External genitalia is normal in appearance, no lesions.  The vagina is normal in appearance. Urethra has no lesions or masses. The cervix is tiny, pap with HPV performed.  Uterus is felt to be normal size, shape, and contour.  No adnexal masses or tenderness noted.Bladder is non tender, no masses felt. Extremities/musculoskeletal:  No swelling or varicosities noted, no clubbing or cyanosis Psych:  No mood changes, alert and cooperative,seems happy   Impression: Well woman gyn exam and pap    Plan: Physical in 1 year, pap in 3 if normal

## 2015-02-08 LAB — CYTOLOGY - PAP

## 2015-07-02 ENCOUNTER — Encounter: Payer: Self-pay | Admitting: Neurology

## 2015-07-02 ENCOUNTER — Ambulatory Visit (INDEPENDENT_AMBULATORY_CARE_PROVIDER_SITE_OTHER): Payer: Medicaid Other | Admitting: Neurology

## 2015-07-02 ENCOUNTER — Telehealth: Payer: Self-pay | Admitting: Neurology

## 2015-07-02 VITALS — BP 118/76 | HR 80 | Ht 64.0 in | Wt 156.5 lb

## 2015-07-02 DIAGNOSIS — M79601 Pain in right arm: Secondary | ICD-10-CM

## 2015-07-02 DIAGNOSIS — G40209 Localization-related (focal) (partial) symptomatic epilepsy and epileptic syndromes with complex partial seizures, not intractable, without status epilepticus: Secondary | ICD-10-CM

## 2015-07-02 NOTE — Progress Notes (Signed)
Reason for visit: Right arm pain  Vanessa Zuniga is an 29 y.o. 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 The patient has seizures associated with this that have been well controlled, she was last seen in December 2016, she has not had any recurring seizures since that time. The patient has developed a new problem with right arm pain that began in early May 2017, and this pain has been ongoing since that time. She may have a day or so without pain, but the pain will always return, the patient indicates that the pain is from the mid upper arm down to the hand. The patient may have some numbness and tingling sensations in the ulnar aspect of the right hand primarily. She denies any shoulder pain or neck discomfort. The pain is not associated with turning the head or looking up or looking down. She denies any particular activity that brings on the pain. The pain is described as a sharp sensation. She is not sure that there is any new weakness, but she does not use the arm as much because of the pain. She denies a change in balance. She is sent to this office for an evaluation. She is taking naproxen if needed for the pain was some benefit.  Past Medical History  Diagnosis Date  . Seizures ([place])     last seizure 06/2014  . History of stroke     age 52  . Developmental delay     mental age of 43, per mother    Past Surgical History  Procedure Laterality Date  . Brain surgery    . Minor excision of oral lesion N/A 05/01/2014    Procedure: EXCISION LIP MASS;  Surgeon: Leta Baptist, MD;  Location: [facility];  Service: ENT;  Laterality: N/A;    Family History  Problem Relation Age of Onset  . Cancer Maternal Aunt     colon   . Asthma Maternal Grandmother   . Diabetes Maternal Grandmother   . Heart disease Maternal Grandfather   .  Cancer Paternal Grandmother     lung    Social history:  reports that she has never smoked. She has never used smokeless tobacco. She reports that she does not drink alcohol or use illicit drugs.    Allergies  Allergen Reactions  . Sulfa Antibiotics Hives    Medications:  Prior to Admission medications   Medication Sig Start Date End Date Taking? Authorizing Provider  carbamazepine (TEGRETOL XR) 200 MG 12 hr tablet Take 1 tablet (200 mg total) by mouth every morning. And 2 tablets (400mg ) every evening 01/09/15  Yes Dennie Bible, NP  lamoTRIgine (LAMICTAL) 100 MG tablet TAKE 3 TABLETS IN THE MORNING AND 3 TABLETS IN THE EVENING 01/09/15  Yes Dennie Bible, NP  Multiple Vitamin (MULTIVITAMIN) tablet Take 1 tablet by mouth daily.   Yes Historical Provider, MD    ROS:  Out of a complete 14 system review of symptoms, the patient complains only of the following symptoms, and all other reviewed systems are negative.  Right arm pain History of seizures  Blood pressure 118/76, pulse 80, height 5\' 4"  (1.626 m), weight 156 lb 8 oz (70.988 kg).  Physical Exam  General: The patient is alert and cooperative at the time of the examination.  Neuromuscular: Range  of movement of the cervical spine is full.  Skin: No significant peripheral edema is noted.   Neurologic Exam  Mental status: The patient is alert and oriented x 3 at the time of the examination. The patient has apparent normal recent and remote memory, with an apparently normal attention span and concentration ability.   Cranial nerves: Facial symmetry is present. Speech is normal, no aphasia or dysarthria is noted. Extraocular movements are full. Visual fields are full.  Motor: The patient has good strength in the left extremities. On the right arm, the patient has 4/5 strength with the triceps, with finger flexion and extension. Wrist flexion and extension is full strength. The patient has full strength with  pronation, 4/5 strength with supination of the forearm. The patient has some weakness with external and internal rotation of the arms. Good deltoid muscle strength is seen bilaterally.  Sensory examination: Soft touch sensation is symmetric on the face, arms, and legs, with exception that there is some decrease soft touch sensation on the ulnar aspect of the right hand.  Coordination: The patient has good finger-nose-finger and heel-to-shin bilaterally.  Gait and station: The patient has a mild circumduction type gait with the right leg. Romberg is negative  Reflexes: Deep tendon reflexes are symmetric, with exception of some increased reflex in the right ankle jerk as compared to the left.   Assessment/Plan:  1. History of seizures, well controlled  2. Right arm discomfort, new onset  The patient has weakness of the right arm and hand at baseline, it is difficult to tell whether or not there is any new weakness. The patient reports sensory alteration primarily in the ulnar aspect of the right hand. She denies any neck or shoulder discomfort. The patient will need to be evaluated for possible ulnar neuropathy, and a cervical radiculopathy will need to be excluded. The patient will be set up for nerve conduction studies of both arms, EMG of the right arm. She will return for this study.  Jill Alexanders MD 07/02/2015 6:49 PM  [facility] [address] [place], [place] [address]  Phone [phone] Fax [phone]

## 2015-07-02 NOTE — Telephone Encounter (Signed)
FYI-Father Miquel Dunn called 8:09:40am to advise Grandmother is bringing daughter to 8:30am appointment with Dr. Jannifer Franklin today. Grandmother had car trouble on the way to appointment, problem resolved and they are on their way, "will be here in 15 minutes at the most".

## 2015-07-25 ENCOUNTER — Ambulatory Visit (INDEPENDENT_AMBULATORY_CARE_PROVIDER_SITE_OTHER): Payer: Medicaid Other | Admitting: Neurology

## 2015-07-25 ENCOUNTER — Ambulatory Visit (INDEPENDENT_AMBULATORY_CARE_PROVIDER_SITE_OTHER): Payer: Self-pay | Admitting: Neurology

## 2015-07-25 ENCOUNTER — Encounter: Payer: Self-pay | Admitting: Neurology

## 2015-07-25 DIAGNOSIS — G40209 Localization-related (focal) (partial) symptomatic epilepsy and epileptic syndromes with complex partial seizures, not intractable, without status epilepticus: Secondary | ICD-10-CM

## 2015-07-25 DIAGNOSIS — M79601 Pain in right arm: Secondary | ICD-10-CM | POA: Diagnosis not present

## 2015-07-25 NOTE — Procedures (Signed)
     HISTORY:  Vanessa Zuniga is a 30 year old patient with a history of seizures who also has complained of some right upper extremity discomfort. The patient has had intermittent episodes of pain down the right arm that have become less frequent over the last several weeks. The patient has had some weakness in the right hand. She is being evaluated for a possible neuropathy or cervical radiculopathy.  NERVE CONDUCTION STUDIES:  Nerve conduction studies were performed on both upper extremities. The distal motor latencies and motor amplitudes for the median and ulnar nerves were within normal limits. The F wave latencies and nerve conduction velocities for these nerves were also normal. The sensory latencies for the median and ulnar nerves were normal.   EMG STUDIES:  EMG study was performed on the right upper extremity:  The first dorsal interosseous muscle reveals 2 to 4 K units with full recruitment. No fibrillations or positive waves were noted. The abductor pollicis brevis muscle reveals 2 to 4 K units with full recruitment. No fibrillations or positive waves were noted. The extensor indicis proprius muscle reveals 1 to 3 K units with decreased recruitment. 2+ fibrillations and positive waves were noted. The extensor digitorum communis muscle reveals 1 to 3 K units with full recruitment. No fibrillations or positive waves were noted. The brachioradialis muscle reveals 1 to 3 K units with full recruitment. No fibrillations or positive waves were seen. The pronator teres muscle reveals 2 to 3 K units with full recruitment. No fibrillations or positive waves were noted. The biceps muscle reveals 1 to 2 K units with full recruitment. No fibrillations or positive waves were noted. The triceps muscle reveals 2 to 4 K units with full recruitment. No fibrillations or positive waves were noted. The anterior deltoid muscle reveals 2 to 3 K units with full recruitment. No fibrillations or positive  waves were noted. The cervical paraspinal muscles were tested at 2 levels. No abnormalities of insertional activity were seen at either level tested. There was good relaxation.   IMPRESSION:  Nerve conduction studies done on the upper extremities bilaterally were normal. No evidence of a neuropathy is seen. EMG evaluation of the right upper extremity shows isolated denervation of the extensor indicis proprius muscle without evidence of an overlying cervical radiculopathy. The clinical significance of this is unclear, this may represent a healing radial neuropathy or a posterior interosseous neuropathy.  Jill Alexanders MD 07/25/2015 4:57 PM  [facility] [address] [place], [place] [address]  Phone [phone] Fax [phone]

## 2015-07-25 NOTE — Progress Notes (Signed)
The patient comes in today for EMG and nerve conduction study evaluation. The right arm pain is become somewhat less frequent. The patient has had some weakness in the right hand, but this is improving some.  Nerve conduction studies on both arms were normal, EMG showed isolated denervation of the extensor indicis proprius muscle on the right. There is no evidence of a cervical radiculopathy.  The clinical significance of these findings is not clear, the patient could have a healing right radial neuropathy or posterior interosseous neuropathy.  We will follow patient  conservatively regarding the pain. If the pain worsens, they are to contact our office. Otherwise, she will follow-up for the next scheduled appointment.

## 2015-07-25 NOTE — Progress Notes (Signed)
Please refer to EMG and nerve conduction study procedure note. 

## 2016-01-01 ENCOUNTER — Ambulatory Visit (INDEPENDENT_AMBULATORY_CARE_PROVIDER_SITE_OTHER): Payer: Medicaid Other | Admitting: Nurse Practitioner

## 2016-01-01 ENCOUNTER — Encounter: Payer: Self-pay | Admitting: Nurse Practitioner

## 2016-01-01 VITALS — BP 113/71 | HR 82 | Ht 64.0 in | Wt 168.2 lb

## 2016-01-01 DIAGNOSIS — Z8673 Personal history of transient ischemic attack (TIA), and cerebral infarction without residual deficits: Secondary | ICD-10-CM | POA: Diagnosis not present

## 2016-01-01 DIAGNOSIS — Z5181 Encounter for therapeutic drug level monitoring: Secondary | ICD-10-CM

## 2016-01-01 DIAGNOSIS — G40209 Localization-related (focal) (partial) symptomatic epilepsy and epileptic syndromes with complex partial seizures, not intractable, without status epilepticus: Secondary | ICD-10-CM

## 2016-01-01 MED ORDER — CARBAMAZEPINE ER 200 MG PO TB12: 200.0000 mg | ORAL_TABLET | Freq: Every morning | ORAL | 11 refills | Status: DC

## 2016-01-01 MED ORDER — LAMOTRIGINE 100 MG PO TABS: ORAL_TABLET | ORAL | 11 refills | Status: DC

## 2016-01-01 NOTE — Patient Instructions (Signed)
Will check labs today Continue Tegretol 200 mg every morning and 2 tabs at night will refill Continue lamotrigine 100 mg 3 tabs twice a day will refill Call for any seizure activity Follow-up yearly and when necessary

## 2016-01-01 NOTE — Progress Notes (Signed)
I have read the note, and I agree with the clinical assessment and plan.  Pierson Vantol KEITH   

## 2016-01-01 NOTE — Progress Notes (Signed)
[facility]  PATIENT: Vanessa Zuniga DOB: [birth year]   REASON FOR VISIT: Follow-up for seizure disorder due to left brain stroke in the past HISTORY FROM: Patient    [place] 12/19/2017CM Vanessa Zuniga, 30 year old female returns for follow-up with history of seizure disorder, history of left brain stroke and right hemiparesis that is chronic in nature. She is currently on Lamictal and Tegretol tolerating the medication without side effects. She was seen by Dr. Jannifer Zuniga in June for some right arm pain however she says she is been pain-free for several months. No recent falls she needs refills blood work today 07/02/15 KWMs. 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 The patient has seizures associated with this that have been well controlled, she was last seen in December 2016, she has not had any recurring seizures since that time. The patient has developed a new problem with right arm pain that began in early May 2017, and this pain has been ongoing since that time. She may have a day or so without pain, but the pain will always return, the patient indicates that the pain is from the mid upper arm down to the hand. The patient may have some numbness and tingling sensations in the ulnar aspect of the right hand primarily. She denies any shoulder pain or neck discomfort. The pain is not associated with turning the head or looking up or looking down. She denies any particular activity that brings on the pain. The pain is described as a sharp sensation. She is not sure that there is any new weakness, but she does not use the arm as much because of the pain. She denies a change in balance. She is sent to this office for an evaluation. She is taking naproxen if needed for the pain was some benefit.   REVIEW OF SYSTEMS: Full 14 system review of systems  performed and notable only for those listed, all others are neg:  Constitutional: neg  Cardiovascular: neg Ear/Nose/Throat: neg  Skin: neg Eyes: neg Respiratory: neg Gastroitestinal: neg  Hematology/Lymphatic: neg  Endocrine: neg Musculoskeletal:neg Allergy/Immunology: neg Neurological: neg Psychiatric: neg Sleep : neg   ALLERGIES: Allergies  Allergen Reactions  . Sulfa Antibiotics Hives    HOME MEDICATIONS: Outpatient Medications Prior to Visit  Medication Sig Dispense Refill  . carbamazepine (TEGRETOL XR) 200 MG 12 hr tablet Take 1 tablet (200 mg total) by mouth every morning. And 2 tablets (400mg ) every evening 90 tablet 11  . lamoTRIgine (LAMICTAL) 100 MG tablet TAKE 3 TABLETS IN THE MORNING AND 3 TABLETS IN THE EVENING 180 tablet 11  . Multiple Vitamin (MULTIVITAMIN) tablet Take 1 tablet by mouth daily.     No facility-administered medications prior to visit.     PAST MEDICAL HISTORY: Past Medical History:  Diagnosis Date  . Developmental delay    mental age of 76, per mother  . History of stroke    age 33  . Seizures ([place])    last seizure 06/2014    PAST SURGICAL HISTORY: Past Surgical History:  Procedure Laterality Date  . BRAIN SURGERY    . MINOR EXCISION OF ORAL LESION N/A 05/01/2014   Procedure: EXCISION LIP MASS;  Surgeon: Leta Baptist, MD;  Location: [facility];  Service: ENT;  Laterality: N/A;    FAMILY HISTORY: Family History  Problem Relation Age of Onset  .  Cancer Maternal Aunt     colon   . Asthma Maternal Grandmother   . Diabetes Maternal Grandmother   . Heart disease Maternal Grandfather   . Cancer Paternal Grandmother     lung    SOCIAL HISTORY: Social History   Social History  . Marital status: Single    Spouse name: N/A  . Number of children: 0  . Years of education: 12   Occupational History  . Not on file.   Social History Main Topics  . Smoking status: Never Smoker  . Smokeless tobacco: Never Used  . Alcohol  use No  . Drug use: No  . Sexual activity: No   Other Topics Concern  . Not on file   Social History Narrative   Patient lives with mother Vanessa Zuniga.   Patient has no children.    Patient as a high school education.   Patient is working.    Patient is single   Patient is left handed.   Patient does not drink caffeine.     PHYSICAL EXAM  Vitals:   01/01/16 1522  BP: 113/71  Pulse: 82  Weight: 168 lb 3.2 oz (76.3 kg)  Height: 5\' 4"  (1.626 m)   Body mass index is 28.87 kg/m. Generalized: Well developed, in no acute distress   Neurological examination   Mentation: Alert oriented to time, place, history taking. Follows all commands speech and language fluent  Cranial nerve II-XII: .Pupils were equal round reactive to light extraocular movements were full, visual field were full on confrontational test. Facial sensation and strength were normal. hearing was intact to finger rubbing bilaterally. Uvula tongue midline. head turning and shoulder shrug and were normal and symmetric.Tongue protrusion into cheek strength was normal. Motor: normal bulk and tone, full strength in the BUE, BLE, except mild residual right hemiparesis  Coordination: finger-nose-finger, heel-to-shin bilaterally, no dysmetria Reflexes: Brisker on the right as compared to the left Gait and Station: Mild circumduction gait, decreased arm swing bil smooth turning, able to perform tiptoe, and heel walking without difficulty. Gait mildly unsteady  DIAGNOSTIC DATA (LABS, IMAGING, TESTING) - I reviewed patient records, labs, notes, testing and imaging myself where available.  Lab Results  Component Value Date   WBC 6.3 01/09/2015   HGB 11.5 01/03/2014   HCT 34.7 01/09/2015   MCV 83 01/09/2015   PLT 326 01/09/2015      Component Value Date/Time   NA 142 01/09/2015 1554   K 4.1 01/09/2015 1554   CL 100 01/09/2015 1554   CO2 24 01/09/2015 1554   GLUCOSE 86 01/09/2015 1554   BUN 11 01/09/2015 1554    CREATININE 0.78 01/09/2015 1554   CALCIUM 9.7 01/09/2015 1554   PROT 7.1 01/09/2015 1554   ALBUMIN 4.4 01/09/2015 1554   AST 31 01/09/2015 1554   ALT 36 (H) 01/09/2015 1554   ALKPHOS 150 (H) 01/09/2015 1554   BILITOT <0.2 01/09/2015 1554   GFRNONAA 102 01/09/2015 1554   GFRAA 118 01/09/2015 1554    ASSESSMENT AND PLAN  30 y.o. year old female  has a past medical history of Developmental delay; History of stroke; and Seizures ([place]). here To follow-up for seizure disorder   Will check labs today Continue Tegretol 200 mg every morning and 2 tabs at night will refill Continue lamotrigine 100 mg 3 tabs twice a day will refill Call for any seizure activity Follow-up yearly and when necessary Dennie Bible, [facility], [facility], APRN  [facility] [address], [address]  [place], [place] [address] [phone]

## 2016-01-09 ENCOUNTER — Ambulatory Visit: Payer: Medicaid Other | Admitting: Nurse Practitioner

## 2016-01-10 ENCOUNTER — Other Ambulatory Visit (INDEPENDENT_AMBULATORY_CARE_PROVIDER_SITE_OTHER): Payer: Self-pay

## 2016-01-10 DIAGNOSIS — Z0289 Encounter for other administrative examinations: Secondary | ICD-10-CM

## 2016-01-11 ENCOUNTER — Telehealth: Payer: Self-pay | Admitting: *Deleted

## 2016-01-11 LAB — CBC WITH DIFFERENTIAL/PLATELET
BASOS: 1 %
Basophils Absolute: 0 10*3/uL (ref 0.0–0.2)
EOS (ABSOLUTE): 0.1 10*3/uL (ref 0.0–0.4)
EOS: 1 %
HEMATOCRIT: 34 % (ref 34.0–46.6)
Hemoglobin: 11.4 g/dL (ref 11.1–15.9)
IMMATURE GRANS (ABS): 0 10*3/uL (ref 0.0–0.1)
IMMATURE GRANULOCYTES: 0 %
Lymphocytes Absolute: 2.2 10*3/uL (ref 0.7–3.1)
Lymphs: 36 %
MCH: 27.7 pg (ref 26.6–33.0)
MCHC: 33.5 g/dL (ref 31.5–35.7)
MCV: 83 fL (ref 79–97)
MONOS ABS: 0.5 10*3/uL (ref 0.1–0.9)
Monocytes: 8 %
NEUTROS ABS: 3.4 10*3/uL (ref 1.4–7.0)
NEUTROS PCT: 54 %
Platelets: 362 10*3/uL (ref 150–379)
RBC: 4.12 x10E6/uL (ref 3.77–5.28)
RDW: 14.2 % (ref 12.3–15.4)
WBC: 6.1 10*3/uL (ref 3.4–10.8)

## 2016-01-11 LAB — COMPREHENSIVE METABOLIC PANEL
A/G RATIO: 1.6 (ref 1.2–2.2)
ALBUMIN: 4.5 g/dL (ref 3.5–5.5)
ALT: 25 IU/L (ref 0–32)
AST: 26 IU/L (ref 0–40)
Alkaline Phosphatase: 166 IU/L — ABNORMAL HIGH (ref 39–117)
BUN / CREAT RATIO: 11 (ref 9–23)
BUN: 10 mg/dL (ref 6–20)
Bilirubin Total: <0.2 mg/dL (ref 0.0–1.2)
CALCIUM: 9.6 mg/dL (ref 8.7–10.2)
CO2: 26 mmol/L (ref 18–29)
Chloride: 98 mmol/L (ref 96–106)
Creatinine, Ser: 0.9 mg/dL (ref 0.57–1.00)
GFR, EST AFRICAN AMERICAN: 99 mL/min/{1.73_m2} (ref >59)
GFR, EST NON AFRICAN AMERICAN: 85 mL/min/{1.73_m2} (ref >59)
GLOBULIN, TOTAL: 2.8 g/dL (ref 1.5–4.5)
Glucose: 90 mg/dL (ref 65–99)
POTASSIUM: 4.3 mmol/L (ref 3.5–5.2)
SODIUM: 140 mmol/L (ref 134–144)
TOTAL PROTEIN: 7.3 g/dL (ref 6.0–8.5)

## 2016-01-11 LAB — CARBAMAZEPINE LEVEL, TOTAL: Carbamazepine (Tegretol), S: 7.7 ug/mL (ref 4.0–12.0)

## 2016-01-11 NOTE — Telephone Encounter (Signed)
LMVM for mother (ok per DPR) that pts labs looked good.  She is to call back if questions.

## 2016-01-11 NOTE — Telephone Encounter (Signed)
-----   Message from Dennie Bible, NP sent at 01/11/2016  8:05 AM EST ----- Labs look good. Please call patient

## 2016-02-07 ENCOUNTER — Encounter: Payer: Self-pay | Admitting: Adult Health

## 2016-02-07 ENCOUNTER — Ambulatory Visit (INDEPENDENT_AMBULATORY_CARE_PROVIDER_SITE_OTHER): Payer: Medicaid Other | Admitting: Adult Health

## 2016-02-07 VITALS — BP 140/82 | HR 87 | Ht 63.5 in | Wt 167.0 lb

## 2016-02-07 DIAGNOSIS — Z01419 Encounter for gynecological examination (general) (routine) without abnormal findings: Secondary | ICD-10-CM | POA: Diagnosis not present

## 2016-02-07 DIAGNOSIS — Z0000 Encounter for general adult medical examination without abnormal findings: Secondary | ICD-10-CM | POA: Diagnosis not present

## 2016-02-07 DIAGNOSIS — Z8673 Personal history of transient ischemic attack (TIA), and cerebral infarction without residual deficits: Secondary | ICD-10-CM

## 2016-02-07 NOTE — Progress Notes (Signed)
Patient ID: Vanessa Zuniga, female   DOB: December 12, 1985, 31 y.o.   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 PCP is Dr Karie Kirks.    Current Medications, Allergies, Past Medical History, Past Surgical History, Family History and Social History were reviewed in [employer] record.     Review of Systems: Patient denies any headaches, hearing loss, fatigue, blurred vision, shortness of breath, chest pain, abdominal pain, problems with bowel movements, urination, or intercourse(not sexually active). No joint pain or mood swings. She says periods are good.   Physical Exam:BP 140/82 (BP Location: Left Arm, Patient Position: Sitting, Cuff Size: Normal)   Pulse 87   Ht 5' 3.5" (1.613 m)   Wt 167 lb (75.8 kg)   LMP 01/23/2016 (Approximate)   BMI 29.12 kg/m  General:  Well developed, well nourished, no acute distress Skin:  Warm and dry Neck:  Midline trachea, normal thyroid, good ROM, no lymphadenopathy Lungs; Clear to auscultation bilaterally Breast:  No dominant palpable mass, retraction, or nipple discharge Cardiovascular: Regular rate and rhythm Abdomen:  Soft, non tender, no hepatosplenomegaly Pelvic:  External genitalia is normal in appearance, no lesions.  The vagina is normal in appearance. Urethra has no lesions or masses. The cervix is nulliparous.  Uterus is felt to be normal size, shape, and contour.  No adnexal masses or tenderness noted.Bladder is non tender, no masses felt. Extremities/musculoskeletal:  No swelling or varicosities noted, no clubbing or cyanosis, has right hemipareisis and right hand contraction Psych:  No mood changes, alert and cooperative,seems happy PHQ 2 score 1.  Impression: 1. Well woman exam  with routine gynecological exam   2. History of stroke       Plan: Physical in 1 year Pap in 2020 Labs with PCP

## 2016-11-17 ENCOUNTER — Other Ambulatory Visit (HOSPITAL_COMMUNITY): Payer: Self-pay | Admitting: Family Medicine

## 2016-11-17 DIAGNOSIS — R7989 Other specified abnormal findings of blood chemistry: Secondary | ICD-10-CM

## 2016-11-17 DIAGNOSIS — R945 Abnormal results of liver function studies: Principal | ICD-10-CM

## 2016-11-17 DIAGNOSIS — D649 Anemia, unspecified: Secondary | ICD-10-CM

## 2016-11-20 ENCOUNTER — Ambulatory Visit (HOSPITAL_COMMUNITY)
Admission: RE | Admit: 2016-11-20 | Discharge: 2016-11-20 | Disposition: A | Discharge status: Home / Self Care | Payer: Medicaid Other | Source: Ambulatory Visit | Attending: Family Medicine | Admitting: Family Medicine

## 2016-11-20 DIAGNOSIS — R945 Abnormal results of liver function studies: Secondary | ICD-10-CM | POA: Diagnosis not present

## 2016-11-20 DIAGNOSIS — D649 Anemia, unspecified: Secondary | ICD-10-CM

## 2016-11-20 DIAGNOSIS — R7989 Other specified abnormal findings of blood chemistry: Secondary | ICD-10-CM

## 2017-01-01 ENCOUNTER — Ambulatory Visit: Payer: Medicaid Other | Admitting: Nurse Practitioner

## 2017-01-02 ENCOUNTER — Other Ambulatory Visit: Payer: Self-pay | Admitting: Nurse Practitioner

## 2017-03-20 NOTE — Progress Notes (Signed)
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  She reports that she had a seizure about 1 month ago.  She denies missing any doses of her medication.  She is currently on Lamictal and Tegretol without side effects.  She denies any falls.  Her meds were just renewed in December for 1 year.  She needs labs today.  She returns for reevaluation    UPDATE 12/19/2017CM Vanessa Zuniga, 32 year old female returns for follow-up with history of seizure disorder, history of left brain stroke and right hemiparesis that is chronic in nature. She is currently on Lamictal and Tegretol tolerating the medication without side effects. She was seen by Dr. Jannifer Franklin in June for some right arm pain however she says she is been pain-free for several months. No recent falls she needs refills blood work today 07/02/15 KWMs. Zuniga is a 32 year old left-handed black female with a history of a left brain stroke in the past, and a right hemiparesis that is chronic in nature. The patient has seizures associated with this that have been well controlled, she was last seen in December 2016, she has not had any recurring seizures since that time. The patient has developed a new problem with right arm pain that began in early May 2017, and this pain has been ongoing since that time. She may have a day or so without pain, but the pain will always return, the patient indicates that the pain is from the mid upper arm down to the hand. The patient may have some numbness and tingling sensations in the ulnar aspect of the right hand primarily. She denies any shoulder pain or neck  discomfort. The pain is not associated with turning the head or looking up or looking down. She denies any particular activity that brings on the pain. The pain is described as a sharp sensation. She is not sure that there is any new weakness, but she does not use the arm as much because of the pain. She denies a change in balance. She is sent to this office for an evaluation. She is taking naproxen if needed for the pain was some benefit.   REVIEW OF SYSTEMS: Full 14 system review of systems performed and notable only for those listed, all others are neg:  Constitutional: neg  Cardiovascular: neg Ear/Nose/Throat: neg  Skin: neg Eyes: neg Respiratory: neg Gastroitestinal: neg  Hematology/Lymphatic: neg  Endocrine: neg Musculoskeletal:neg Allergy/Immunology: neg Neurological: Seizure disorder Psychiatric: neg Sleep : neg   ALLERGIES: Allergies  Allergen Reactions  . Sulfa Antibiotics Hives    HOME MEDICATIONS: Outpatient Medications Prior to Visit  Medication Sig Dispense Refill  . IRON PO Take 325 mg by mouth daily.    Marland Kitchen lamoTRIgine (LAMICTAL) 100 MG tablet TAKE 3 IN THE MORNING AND 3 IN THE EVENING 180 tablet 11  . Multiple Vitamin (MULTIVITAMIN) tablet Take 1 tablet by mouth daily.    . TEGRETOL-XR 200 MG 12 hr tablet TAKE ONE TABLET EVERY MORNING AND TWO TABLETS EVERY EVENING 90 tablet 11   No facility-administered medications prior to visit.     PAST MEDICAL HISTORY: Past Medical History:  Diagnosis Date  . Developmental delay  mental age of 77, per mother  . History of stroke    age 70  . Seizures ([place])    last seizure 06/2014    PAST SURGICAL HISTORY: Past Surgical History:  Procedure Laterality Date  . BRAIN SURGERY    . MINOR EXCISION OF ORAL LESION N/A 05/01/2014   Procedure: EXCISION LIP MASS;  Surgeon: Leta Baptist, MD;  Location: [place];  Service: ENT;  Laterality: N/A;    FAMILY HISTORY: Family History  Problem Relation Age of Onset    . Cancer Maternal Aunt        colon   . Asthma Maternal Grandmother   . Diabetes Maternal Grandmother   . Heart disease Maternal Grandfather   . Cancer Paternal Grandmother        lung    SOCIAL HISTORY: Social History   Socioeconomic History  . Marital status: Single    Spouse name: Not on file  . Number of children: 0  . Years of education: 59  . Highest education level: Not on file  Social Needs  . Financial resource strain: Not on file  . Food insecurity - worry: Not on file  . Food insecurity - inability: Not on file  . Transportation needs - medical: Not on file  . Transportation needs - non-medical: Not on file  Occupational History  . Not on file  Tobacco Use  . Smoking status: Never Smoker  . Smokeless tobacco: Never Used  Substance and Sexual Activity  . Alcohol use: No  . Drug use: No  . Sexual activity: No    Birth control/protection: None  Other Topics Concern  . Not on file  Social History Narrative   Patient lives with mother [name].   Patient has no children.    Patient as a high school education.   Patient is working.    Patient is single   Patient is left handed.   Patient does not drink caffeine.     PHYSICAL EXAM  Vitals:   03/23/17 1358  BP: 123/79  Pulse: 81  Weight: 165 lb (74.8 kg)  Height: 5\' 4"  (1.626 m)   Body mass index is 28.32 kg/m. Generalized: Well developed, in no acute distress   Neurological examination   Mentation: Alert oriented to time, place, history taking. Follows all commands speech and language fluent  Cranial nerve II-XII: .Pupils were equal round reactive to light extraocular movements were full, visual field were full on confrontational test. Facial sensation and strength were normal. hearing was intact to finger rubbing bilaterally. Uvula tongue midline. head turning and shoulder shrug and were normal and symmetric.Tongue protrusion into cheek strength was normal. Motor: normal bulk and tone, full  strength in the BUE, BLE, except mild residual right hemiparesis  Coordination: finger-nose-finger, heel-to-shin bilaterally, no dysmetria Reflexes: Brisker on the right as compared to the left Gait and Station: Mild circumduction gait, decreased arm swing bil smooth turning, able to perform tiptoe, and heel walking without difficulty. Tandem gait mildly unsteady, no assistive device  DIAGNOSTIC DATA (LABS, IMAGING, TESTING) - I reviewed patient records, labs, notes, testing and imaging myself where available.  Lab Results  Component Value Date   WBC 6.1 01/10/2016   HGB 11.4 01/10/2016   HCT 34.0 01/10/2016   MCV 83 01/10/2016   PLT 362 01/10/2016      Component Value Date/Time   NA 140 01/10/2016 1110   K 4.3 01/10/2016 1110   CL 98 01/10/2016 1110   CO2 26  01/10/2016 1110   GLUCOSE 90 01/10/2016 1110   BUN 10 01/10/2016 1110   CREATININE 0.90 01/10/2016 1110   CALCIUM 9.6 01/10/2016 1110   PROT 7.3 01/10/2016 1110   ALBUMIN 4.5 01/10/2016 1110   AST 26 01/10/2016 1110   ALT 25 01/10/2016 1110   ALKPHOS 166 (H) 01/10/2016 1110   BILITOT <0.2 01/10/2016 1110   GFRNONAA 85 01/10/2016 1110   GFRAA 99 01/10/2016 1110    ASSESSMENT AND PLAN  32 y.o. year old female  has a past medical history of Developmental delay, History of stroke, and Seizures ([place]). here To follow-up for seizure disorder.  Patient had a seizure approximately 1 month ago.  She does not drive   Will check labs today, CBC, CMP to monitor adverse effects of Tegretol and Lamictal CBZ level to monitor for therapeutic level Continue Tegretol 200 mg every morning and 2 tabs at night Continue lamotrigine 100 mg 3 tabs twice a day  Follow-up yearly and when necessary Dennie Bible, [facility], [facility], [facility] [address], [place], [place] [address] [phone]

## 2017-03-23 ENCOUNTER — Ambulatory Visit: Payer: Medicaid Other | Admitting: Nurse Practitioner

## 2017-03-23 ENCOUNTER — Encounter: Payer: Self-pay | Admitting: Nurse Practitioner

## 2017-03-23 VITALS — BP 123/79 | HR 81 | Ht 64.0 in | Wt 165.0 lb

## 2017-03-23 DIAGNOSIS — G40209 Localization-related (focal) (partial) symptomatic epilepsy and epileptic syndromes with complex partial seizures, not intractable, without status epilepticus: Secondary | ICD-10-CM | POA: Diagnosis not present

## 2017-03-23 DIAGNOSIS — Z8673 Personal history of transient ischemic attack (TIA), and cerebral infarction without residual deficits: Secondary | ICD-10-CM | POA: Diagnosis not present

## 2017-03-23 DIAGNOSIS — Z5181 Encounter for therapeutic drug level monitoring: Secondary | ICD-10-CM | POA: Diagnosis not present

## 2017-03-23 NOTE — Progress Notes (Signed)
I have read the note, and I agree with the clinical assessment and plan.  Taegen Lennox K Hollin Crewe   

## 2017-03-23 NOTE — Patient Instructions (Signed)
Will check labs today, CBC, CMP and CBZ level Continue Tegretol 200 mg every morning and 2 tabs at night Continue lamotrigine 100 mg 3 tabs twice a day  Follow-up yearly and when necessary

## 2017-03-24 ENCOUNTER — Telehealth: Payer: Self-pay | Admitting: *Deleted

## 2017-03-24 LAB — CBC WITH DIFFERENTIAL/PLATELET
BASOS: 0 %
Basophils Absolute: 0 10*3/uL (ref 0.0–0.2)
EOS (ABSOLUTE): 0.1 10*3/uL (ref 0.0–0.4)
EOS: 2 %
HEMATOCRIT: 37.7 % (ref 34.0–46.6)
HEMOGLOBIN: 12.6 g/dL (ref 11.1–15.9)
IMMATURE GRANS (ABS): 0 10*3/uL (ref 0.0–0.1)
Immature Granulocytes: 0 %
LYMPHS: 31 %
Lymphocytes Absolute: 2.2 10*3/uL (ref 0.7–3.1)
MCH: 27.8 pg (ref 26.6–33.0)
MCHC: 33.4 g/dL (ref 31.5–35.7)
MCV: 83 fL (ref 79–97)
MONOCYTES: 7 %
MONOS ABS: 0.5 10*3/uL (ref 0.1–0.9)
NEUTROS PCT: 60 %
Neutrophils Absolute: 4.4 10*3/uL (ref 1.4–7.0)
Platelets: 314 10*3/uL (ref 150–379)
RBC: 4.53 x10E6/uL (ref 3.77–5.28)
RDW: 13.1 % (ref 12.3–15.4)
WBC: 7.2 10*3/uL (ref 3.4–10.8)

## 2017-03-24 LAB — COMPREHENSIVE METABOLIC PANEL
A/G RATIO: 1.6 (ref 1.2–2.2)
ALT: 29 IU/L (ref 0–32)
AST: 32 IU/L (ref 0–40)
Albumin: 4.5 g/dL (ref 3.5–5.5)
Alkaline Phosphatase: 211 IU/L — ABNORMAL HIGH (ref 39–117)
BUN / CREAT RATIO: 7 — AB (ref 9–23)
BUN: 7 mg/dL (ref 6–20)
Bilirubin Total: <0.2 mg/dL (ref 0.0–1.2)
CALCIUM: 9.7 mg/dL (ref 8.7–10.2)
CO2: 16 mmol/L — ABNORMAL LOW (ref 20–29)
Chloride: 107 mmol/L — ABNORMAL HIGH (ref 96–106)
Creatinine, Ser: 0.95 mg/dL (ref 0.57–1.00)
GFR calc Af Amer: 92 mL/min/{1.73_m2} (ref >59)
GFR, EST NON AFRICAN AMERICAN: 80 mL/min/{1.73_m2} (ref >59)
Globulin, Total: 2.9 g/dL (ref 1.5–4.5)
Glucose: 85 mg/dL (ref 65–99)
POTASSIUM: 4.4 mmol/L (ref 3.5–5.2)
SODIUM: 145 mmol/L — AB (ref 134–144)
Total Protein: 7.4 g/dL (ref 6.0–8.5)

## 2017-03-24 LAB — CARBAMAZEPINE LEVEL, TOTAL

## 2017-03-24 NOTE — Telephone Encounter (Signed)
LVM informing patient's mother, on DPR that her labs are okay. Advised they were unable to get her Tegretol level. Left number for any questions.

## 2017-05-08 ENCOUNTER — Other Ambulatory Visit: Payer: Self-pay

## 2017-05-12 DIAGNOSIS — L608 Other nail disorders: Secondary | ICD-10-CM | POA: Insufficient documentation

## 2017-12-22 ENCOUNTER — Other Ambulatory Visit (HOSPITAL_COMMUNITY)
Admission: RE | Admit: 2017-12-22 | Discharge: 2017-12-22 | Disposition: A | Discharge status: Home / Self Care | Payer: Medicaid Other | Source: Ambulatory Visit | Attending: Adult Health | Admitting: Adult Health

## 2017-12-22 ENCOUNTER — Ambulatory Visit: Payer: Medicaid Other | Admitting: Adult Health

## 2017-12-22 ENCOUNTER — Encounter: Payer: Self-pay | Admitting: Adult Health

## 2017-12-22 ENCOUNTER — Other Ambulatory Visit: Payer: Self-pay

## 2017-12-22 VITALS — BP 124/81 | HR 86 | Resp 16 | Ht 63.5 in | Wt 167.0 lb

## 2017-12-22 DIAGNOSIS — Z124 Encounter for screening for malignant neoplasm of cervix: Secondary | ICD-10-CM | POA: Diagnosis present

## 2017-12-22 DIAGNOSIS — Z0000 Encounter for general adult medical examination without abnormal findings: Secondary | ICD-10-CM | POA: Diagnosis not present

## 2017-12-22 DIAGNOSIS — Z01419 Encounter for gynecological examination (general) (routine) without abnormal findings: Secondary | ICD-10-CM

## 2017-12-22 NOTE — Progress Notes (Signed)
Patient ID: Vanessa Zuniga, female   DOB: [birth year], 32 y.o.   MRN: [id number] History of Present Illness:  Vanessa Zuniga is a 32 year old black female, single, G0P0 in for well woman gyn exam and pap.She has history of seizures and had left brain stroke with chronic right hemiparesis  And sees Dr Jannifer Franklin at [facility]. PCP is Dr Karie Kirks.  Current Medications, Allergies, Past Medical History, Past Surgical History, Family History and Social History were reviewed in [employer] record.     Review of Systems: Patient denies any headaches, hearing loss, fatigue, blurred vision, shortness of breath, chest pain, abdominal pain, problems with bowel movements, urination, or intercourse.(never had sex) No joint pain or mood swings. Periods are good.    Physical Exam:BP 124/81 (BP Location: Right Arm, Patient Position: Sitting, Cuff Size: Normal)   Pulse 86   Resp 16   Ht 5' 3.5" (1.613 m)   Wt 167 lb (75.8 kg)   LMP 12/01/2017 (Approximate)   BMI 29.12 kg/m  General:  Well developed, well nourished, no acute distress Skin:  Warm and dry Neck:  Midline trachea, normal thyroid, good ROM, no lymphadenopathy Lungs; Clear to auscultation bilaterally Breast:  No dominant palpable mass, retraction, or nipple discharge Cardiovascular: Regular rate and rhythm Abdomen:  Soft, non tender, no hepatosplenomegaly Pelvic:  External genitalia is normal in appearance, no lesions.  The vagina is normal in appearance,pediatric speculum used. Urethra has no lesions or masses. The cervix is nulliparous, pap with HPV performed.  Uterus is felt to be normal size, shape, and contour.  No adnexal masses or tenderness noted.Bladder is non tender, no masses felt. Extremities/musculoskeletal:  No swelling or varicosities noted, no clubbing or cyanosis.Has right hemiparesis and she has contraction of right hand  Psych:  No mood changes, alert and cooperative,seems happy PHQ 2 score 0. Fall risk is  low. Examination chaperoned by Shela Nevin RN.  Impression:  1. Encounter for routine gynecological examination with Papanicolaou smear of cervix   2. Routine cervical smear      Plan: Physical in 1 year Pap in 3 if normal Labs with PCP and Dr Jannifer Franklin

## 2017-12-25 LAB — CYTOLOGY - PAP
DIAGNOSIS: NEGATIVE
HPV (WINDOPATH): NOT DETECTED

## 2018-01-01 ENCOUNTER — Other Ambulatory Visit: Payer: Self-pay | Admitting: Nurse Practitioner

## 2018-03-25 ENCOUNTER — Ambulatory Visit: Payer: Medicaid Other | Admitting: Nurse Practitioner

## 2018-04-06 ENCOUNTER — Telehealth: Payer: Self-pay

## 2018-04-06 NOTE — Telephone Encounter (Signed)
I contacted the pt and spoke with her mom, yolanda ( ok per dpr) about appt scheduled for 04/07/18 with Dr. Jannifer Franklin. I advised, due to covid-19 concerns our office is limiting the number of patient's coming into the clinic for the next two weeks. I also stated our office is providing telephone visits during this time for our f/u's. I also stated the telephone visit will be billed through insurance and due to hippa concerns is not as secure as a face to face encounter. However, once a verbal consent is provided we can schedule this appointment for you.   Pt's mother provided verbal ok for telephone visit. She states the best numbers are listed on file. She states the pt has not had any seizure like activity since last o/v and medications have not change.  I also reviewed current pharm and allergies and updated appropriately.  Pt's mom advised for pt to be available between 3 and 3:30 pm for telephone call from Dr. Jannifer Franklin.

## 2018-04-07 ENCOUNTER — Other Ambulatory Visit: Payer: Self-pay

## 2018-04-07 ENCOUNTER — Encounter: Payer: Self-pay | Admitting: Neurology

## 2018-04-07 ENCOUNTER — Telehealth (INDEPENDENT_AMBULATORY_CARE_PROVIDER_SITE_OTHER): Payer: Medicaid Other | Admitting: Neurology

## 2018-04-07 DIAGNOSIS — G40209 Localization-related (focal) (partial) symptomatic epilepsy and epileptic syndromes with complex partial seizures, not intractable, without status epilepticus: Secondary | ICD-10-CM

## 2018-04-07 DIAGNOSIS — R269 Unspecified abnormalities of gait and mobility: Secondary | ICD-10-CM

## 2018-04-07 DIAGNOSIS — Z8673 Personal history of transient ischemic attack (TIA), and cerebral infarction without residual deficits: Secondary | ICD-10-CM | POA: Diagnosis not present

## 2018-04-07 HISTORY — DX: Unspecified abnormalities of gait and mobility: R26.9

## 2018-04-07 NOTE — Progress Notes (Signed)
Virtual Visit via Telephone Note  I connected with [name] on 04/07/18 at  3:30 PM EDT by telephone and verified that I am speaking with the correct person using two identifiers.   I discussed the limitations, risks, security and privacy concerns of performing an evaluation and management service by telephone and the availability of in person appointments. I also discussed with the patient that there may be a patient responsible charge related to this service. The patient expressed understanding and agreed to proceed.   History of Present Illness: Vanessa Zuniga is a 33 year old left-handed black female with a history of cerebrovascular disease, the patient sustained a stroke affecting the left brain with a right hemiparesis and a mild gait disorder.  The patient has seizures secondary to this.  She is on carbamazepine and Lamictal, she tolerates medications quite well.  She was seen about a year ago and has not had any seizures over that timeframe.  She did have a seizure in February 2019, she has not missed any doses of medication she was not sick around that time.  She does not operate a motor vehicle.  She has not had any falls.  There have been no other new medical problems that of, up since last seen.   Observations/Objective: N/A  Assessment and Plan: 1.  History of stroke, right hemiparesis  2.  Gait disorder  3.  Seizures, well controlled  The patient has not had any seizures since last seen.  She has had routine blood work done through her primary care physician just within the last several weeks.  The patient will follow-up here in about 6 months, she has gotten recent prescriptions in December 2019.  We will check blood work when she is seen on the next evaluation.  Follow Up Instructions: Follow-up in 6 months.   I discussed the assessment and treatment plan with the patient. The patient was provided an opportunity to ask questions and all were answered. The patient  agreed with the plan and demonstrated an understanding of the instructions.   The patient was advised to call back or seek an in-person evaluation if the symptoms worsen or if the condition fails to improve as anticipated.  I provided 15 minutes of non-face-to-face time during this encounter.   Kathrynn Ducking, MD

## 2018-06-25 ENCOUNTER — Other Ambulatory Visit: Payer: Self-pay

## 2018-06-25 ENCOUNTER — Other Ambulatory Visit: Payer: Medicaid Other

## 2018-06-25 DIAGNOSIS — Z20822 Contact with and (suspected) exposure to covid-19: Secondary | ICD-10-CM

## 2018-07-01 LAB — NOVEL CORONAVIRUS, NAA: SARS-CoV-2, NAA: NOT DETECTED

## 2018-10-10 NOTE — Progress Notes (Signed)
PATIENT: Vanessa Zuniga DOB: 1985/09/17  REASON FOR VISIT: follow up HISTORY FROM: patient  HISTORY OF PRESENT ILLNESS: Today 10/11/18  Ms. Vanessa Zuniga is a 33 year old female with history of cerebrovascular disease, and stroke affecting the left brain with a right hemiparesis and mild gait disorder.  She has seizures secondary to this.  She remains on carbamazepine and Lamictal.  Her last seizure occurred in February 2019.  She does not drive a car.  She lives with her mother.  She is able to manage her medications and perform all of her own ADLs.  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  She has not had recent seizure.  She indicates her overall health has been well. She has not had any falls.  She presents today for follow-up accompanied by her mother. She has routine follow-up with her primary care doctor. She denies any new problems or concerns.  She reports tolerability and compliance with her medications.  HISTORY 04/07/2018 Dr. Jannifer Franklin: Vanessa Zuniga is a 33 year old left-handed black female with a history of cerebrovascular disease, the patient sustained a stroke affecting the left brain with a right hemiparesis and a mild gait disorder.  The patient has seizures secondary to this.  She is on carbamazepine and Lamictal, she tolerates medications quite well.  She was seen about a year ago and has not had any seizures over that timeframe.  She did have a seizure in February 2019, she has not missed any doses of medication she was not sick around that time.  She does not operate a motor vehicle.  She has not had any falls.  There have been no other new medical problems that of, up since last seen.  REVIEW OF SYSTEMS: Out of a complete 14 system review of symptoms, the patient complains only of the following symptoms, and all other reviewed systems are negative.  Seizures  ALLERGIES: Allergies  Allergen Reactions  . Sulfa Antibiotics Hives    HOME  MEDICATIONS: Outpatient Medications Prior to Visit  Medication Sig Dispense Refill  . IRON PO Take 325 mg by mouth daily.    . Multiple Vitamin (MULTIVITAMIN) tablet Take 1 tablet by mouth daily.    Marland Kitchen lamoTRIgine (LAMICTAL) 100 MG tablet TAKE 3 TABLETS BY MOUTH IN THE MORNING AND 3 TABLETS IN THE EVENING. 180 tablet 11  . TEGRETOL-XR 200 MG 12 hr tablet TAKE ONE TABLET BY MOUTH EVERY MORNING AND TWO TABLETS EVERY EVENING. 90 tablet 11   No facility-administered medications prior to visit.     PAST MEDICAL HISTORY: Past Medical History:  Diagnosis Date  . Developmental delay    mental age of 10, per mother  . Gait abnormality 04/07/2018  . History of stroke    age 1  . Seizures ([place])    last seizure 06/2014    PAST SURGICAL HISTORY: Past Surgical History:  Procedure Laterality Date  . BRAIN SURGERY    . MINOR EXCISION OF ORAL LESION N/A 05/01/2014   Procedure: EXCISION LIP MASS;  Surgeon: Leta Baptist, MD;  Location: [place];  Service: ENT;  Laterality: N/A;    FAMILY HISTORY: Family History  Problem Relation Age of Onset  . Cancer Maternal Aunt        colon   . Asthma Maternal Grandmother   . Diabetes Maternal Grandmother   . Heart disease Maternal Grandfather   . Cancer Paternal Grandmother        lung  SOCIAL HISTORY: Social History   Socioeconomic History  . Marital status: Single    Spouse name: Not on file  . Number of children: 0  . Years of education: 7  . Highest education level: Not on file  Occupational History  . Not on file  Social Needs  . Financial resource strain: Not on file  . Food insecurity    Worry: Not on file    Inability: Not on file  . Transportation needs    Medical: Not on file    Non-medical: Not on file  Tobacco Use  . Smoking status: Never Smoker  . Smokeless tobacco: Never Used  Substance and Sexual Activity  . Alcohol use: No  . Drug use: No  . Sexual activity: Never    Birth control/protection: None   Lifestyle  . Physical activity    Days per week: Not on file    Minutes per session: Not on file  . Stress: Not on file  Relationships  . Social Herbalist on phone: Not on file    Gets together: Not on file    Attends religious service: Not on file    Active member of club or organization: Not on file    Attends meetings of clubs or organizations: Not on file    Relationship status: Not on file  . Intimate partner violence    Fear of current or ex partner: Not on file    Emotionally abused: Not on file    Physically abused: Not on file    Forced sexual activity: Not on file  Other Topics Concern  . Not on file  Social History Narrative   Patient lives with mother [name].   Patient has no children.    Patient as a high school education.   Patient is working.    Patient is single   Patient is left handed.   Patient does not drink caffeine.    PHYSICAL EXAM  Vitals:   10/11/18 1453  BP: 102/62  Pulse: 60  Temp: 98.4 F (36.9 C)  TempSrc: Oral  Weight: 170 lb (77.1 kg)  Height: 5' 3.5" (1.613 m)   Body mass index is 29.64 kg/m.  Generalized: Well developed, in no acute distress   Neurological examination  Mentation: Alert oriented to time, place, history taking. Follows all commands speech and language fluent Cranial nerve II-XII: Pupils were equal round reactive to light. Extraocular movements were full, visual field were full on confrontational test. Facial sensation and strength were normal.  Head turning and shoulder shrug  were normal and symmetric. Motor: The motor testing reveals 5 over 5 strength of all 4 extremities. Good symmetric motor tone is noted throughout.  Sensory: Sensory testing is intact to soft touch on all 4 extremities. No evidence of extinction is noted.  Coordination: Cerebellar testing reveals good finger-nose-finger and heel-to-shin bilaterally.  Gait and station: Mild circumduction type gait, tandem gait is normal. Romberg is  negative. No drift is seen.  Reflexes: Deep tendon reflexes are symmetric and normal bilaterally.   DIAGNOSTIC DATA (LABS, IMAGING, TESTING) - I reviewed patient records, labs, notes, testing and imaging myself where available.  Lab Results  Component Value Date   WBC 7.2 03/23/2017   HGB 12.6 03/23/2017   HCT 37.7 03/23/2017   MCV 83 03/23/2017   PLT 314 03/23/2017      Component Value Date/Time   NA 145 (H) 03/23/2017 1432   K 4.4 03/23/2017 1432   CL  107 (H) 03/23/2017 1432   CO2 16 (L) 03/23/2017 1432   GLUCOSE 85 03/23/2017 1432   BUN 7 03/23/2017 1432   CREATININE 0.95 03/23/2017 1432   CALCIUM 9.7 03/23/2017 1432   PROT 7.4 03/23/2017 1432   ALBUMIN 4.5 03/23/2017 1432   AST 32 03/23/2017 1432   ALT 29 03/23/2017 1432   ALKPHOS 211 (H) 03/23/2017 1432   BILITOT <0.2 03/23/2017 1432   GFRNONAA 80 03/23/2017 1432   GFRAA 92 03/23/2017 1432   No results found for: CHOL, HDL, LDLCALC, LDLDIRECT, TRIG, CHOLHDL No results found for: HGBA1C No results found for: VITAMINB12 No results found for: TSH  ASSESSMENT AND PLAN 33 y.o. year old female  has a past medical history of Developmental delay, Gait abnormality (04/07/2018), History of stroke, and Seizures ([place]). here with:  1.  History of seizures, well controlled 2.  History of stroke, right hemiparesis 3.  Gait disorder   Overall, she has continued to do well.  She has not had recurrent seizures since February 2019.  She will continue taking Lamictal and Tegretol.  I will check routine lab work today.  I will send in a 1 year refill of her medications.  She will follow-up in this office in 1 year or sooner if needed.  I did advise her symptoms worsen or she develops any new symptoms she should let us know.  I spent 25 minutes with the patient. 50% of this time was spent discussing her plan of care.  Butler Denmark, AGNP-C, DNP 10/11/2018, 3:11 PM [facility] [address] [place], [place]  [address] [phone]

## 2018-10-11 ENCOUNTER — Other Ambulatory Visit: Payer: Self-pay

## 2018-10-11 ENCOUNTER — Ambulatory Visit: Payer: Medicaid Other | Admitting: Neurology

## 2018-10-11 ENCOUNTER — Encounter: Payer: Self-pay | Admitting: Neurology

## 2018-10-11 VITALS — BP 102/62 | HR 60 | Temp 98.4°F | Ht 63.5 in | Wt 170.0 lb

## 2018-10-11 DIAGNOSIS — G40209 Localization-related (focal) (partial) symptomatic epilepsy and epileptic syndromes with complex partial seizures, not intractable, without status epilepticus: Secondary | ICD-10-CM

## 2018-10-11 DIAGNOSIS — Z8673 Personal history of transient ischemic attack (TIA), and cerebral infarction without residual deficits: Secondary | ICD-10-CM

## 2018-10-11 DIAGNOSIS — R269 Unspecified abnormalities of gait and mobility: Secondary | ICD-10-CM

## 2018-10-11 MED ORDER — LAMOTRIGINE 100 MG PO TABS: ORAL_TABLET | ORAL | 11 refills | Status: DC

## 2018-10-11 MED ORDER — TEGRETOL-XR 200 MG PO TB12: ORAL_TABLET | ORAL | 11 refills | Status: DC

## 2018-10-11 NOTE — Patient Instructions (Signed)
1. Continue current medications 2. I will check lab work today  3. Return in 1 year

## 2018-10-12 NOTE — Progress Notes (Signed)
I have read the note, and I agree with the clinical assessment and plan.  Charles K Willis   

## 2018-10-13 LAB — CBC WITH DIFFERENTIAL/PLATELET
Basophils Absolute: 0.1 10*3/uL (ref 0.0–0.2)
Basos: 1 %
EOS (ABSOLUTE): 0.1 10*3/uL (ref 0.0–0.4)
Eos: 2 %
Hematocrit: 36.3 % (ref 34.0–46.6)
Hemoglobin: 12.1 g/dL (ref 11.1–15.9)
Immature Grans (Abs): 0 10*3/uL (ref 0.0–0.1)
Immature Granulocytes: 0 %
Lymphocytes Absolute: 2.2 10*3/uL (ref 0.7–3.1)
Lymphs: 36 %
MCH: 28.2 pg (ref 26.6–33.0)
MCHC: 33.3 g/dL (ref 31.5–35.7)
MCV: 85 fL (ref 79–97)
Monocytes Absolute: 0.6 10*3/uL (ref 0.1–0.9)
Monocytes: 9 %
Neutrophils Absolute: 3.2 10*3/uL (ref 1.4–7.0)
Neutrophils: 52 %
Platelets: 319 10*3/uL (ref 150–450)
RBC: 4.29 x10E6/uL (ref 3.77–5.28)
RDW: 12.6 % (ref 11.7–15.4)
WBC: 6.2 10*3/uL (ref 3.4–10.8)

## 2018-10-13 LAB — COMPREHENSIVE METABOLIC PANEL
ALT: 46 IU/L — ABNORMAL HIGH (ref 0–32)
AST: 33 IU/L (ref 0–40)
Albumin/Globulin Ratio: 1.8 (ref 1.2–2.2)
Albumin: 4.6 g/dL (ref 3.8–4.8)
Alkaline Phosphatase: 283 IU/L — ABNORMAL HIGH (ref 39–117)
BUN/Creatinine Ratio: 11 (ref 9–23)
BUN: 11 mg/dL (ref 6–20)
Bilirubin Total: <0.2 mg/dL (ref 0.0–1.2)
CO2: 24 mmol/L (ref 20–29)
Calcium: 9.4 mg/dL (ref 8.7–10.2)
Chloride: 103 mmol/L (ref 96–106)
Creatinine, Ser: 1.04 mg/dL — ABNORMAL HIGH (ref 0.57–1.00)
GFR calc Af Amer: 81 mL/min/{1.73_m2} (ref >59)
GFR calc non Af Amer: 70 mL/min/{1.73_m2} (ref >59)
Globulin, Total: 2.6 g/dL (ref 1.5–4.5)
Glucose: 88 mg/dL (ref 65–99)
Potassium: 4 mmol/L (ref 3.5–5.2)
Sodium: 142 mmol/L (ref 134–144)
Total Protein: 7.2 g/dL (ref 6.0–8.5)

## 2018-10-13 LAB — LAMOTRIGINE LEVEL: Lamotrigine Lvl: 11.7 ug/mL (ref 2.0–20.0)

## 2018-10-13 LAB — CARBAMAZEPINE LEVEL, TOTAL: Carbamazepine (Tegretol), S: 8.7 ug/mL (ref 4.0–12.0)

## 2018-10-14 ENCOUNTER — Telehealth: Payer: Self-pay | Admitting: *Deleted

## 2018-10-14 NOTE — Telephone Encounter (Signed)
I called pt LMVM for her to return call relating to lab results.

## 2018-10-14 NOTE — Telephone Encounter (Signed)
-----   Message from Suzzanne Cloud, NP sent at 10/13/2018  4:51 PM EDT ----- Please call the patient. Labs show a continued increase in alkaline phosphatase overtime, the ALT has increased. This is likely related to carbamazepine. I would like to recheck this in 3 months. I will send myself an Epic reminder and let the patient know. Otherwise, labs look good.

## 2018-10-18 ENCOUNTER — Telehealth: Payer: Self-pay | Admitting: *Deleted

## 2018-10-18 NOTE — Telephone Encounter (Signed)
Called pt to relay lab results.  She did not pick up.  Will send my chart email.

## 2018-12-16 ENCOUNTER — Other Ambulatory Visit: Payer: Self-pay

## 2018-12-16 DIAGNOSIS — Z20822 Contact with and (suspected) exposure to covid-19: Secondary | ICD-10-CM

## 2018-12-20 LAB — NOVEL CORONAVIRUS, NAA: SARS-CoV-2, NAA: NOT DETECTED

## 2019-01-03 ENCOUNTER — Other Ambulatory Visit: Payer: Self-pay

## 2019-01-03 ENCOUNTER — Other Ambulatory Visit (INDEPENDENT_AMBULATORY_CARE_PROVIDER_SITE_OTHER): Payer: Self-pay

## 2019-01-03 DIAGNOSIS — Z5181 Encounter for therapeutic drug level monitoring: Secondary | ICD-10-CM

## 2019-01-03 DIAGNOSIS — Z0289 Encounter for other administrative examinations: Secondary | ICD-10-CM

## 2019-01-04 ENCOUNTER — Telehealth: Payer: Self-pay

## 2019-01-04 LAB — COMPREHENSIVE METABOLIC PANEL
ALT: 47 IU/L — ABNORMAL HIGH (ref 0–32)
AST: 36 IU/L (ref 0–40)
Albumin/Globulin Ratio: 1.7 (ref 1.2–2.2)
Albumin: 4.2 g/dL (ref 3.8–4.8)
Alkaline Phosphatase: 273 IU/L — ABNORMAL HIGH (ref 39–117)
BUN/Creatinine Ratio: 9 (ref 9–23)
BUN: 9 mg/dL (ref 6–20)
Bilirubin Total: <0.2 mg/dL (ref 0.0–1.2)
CO2: 24 mmol/L (ref 20–29)
Calcium: 9.5 mg/dL (ref 8.7–10.2)
Chloride: 101 mmol/L (ref 96–106)
Creatinine, Ser: 0.96 mg/dL (ref 0.57–1.00)
GFR calc Af Amer: 89 mL/min/{1.73_m2} (ref >59)
GFR calc non Af Amer: 77 mL/min/{1.73_m2} (ref >59)
Globulin, Total: 2.5 g/dL (ref 1.5–4.5)
Glucose: 91 mg/dL (ref 65–99)
Potassium: 4.4 mmol/L (ref 3.5–5.2)
Sodium: 139 mmol/L (ref 134–144)
Total Protein: 6.7 g/dL (ref 6.0–8.5)

## 2019-01-04 NOTE — Telephone Encounter (Signed)
Called pt to go over pts lab results per NP Butler Denmark. No answer left Voicemail for her to return call.  No DPR. Please relay results.Marland Kitchen    "Please let the patient know the alkaline phosphatase continues to be elevated but is somewhat improved from prior. This is likely coming from carbmazepine use long term. I would like to check again in 3 months, I will give myself a reminder, I will also check ggt to see if coming from liver. I will give myself a reminder in Epic."-NP SS

## 2019-01-21 ENCOUNTER — Ambulatory Visit: Payer: Medicaid Other | Attending: Internal Medicine

## 2019-01-21 ENCOUNTER — Other Ambulatory Visit: Payer: Self-pay

## 2019-01-21 DIAGNOSIS — Z20822 Contact with and (suspected) exposure to covid-19: Secondary | ICD-10-CM

## 2019-01-22 LAB — NOVEL CORONAVIRUS, NAA: SARS-CoV-2, NAA: DETECTED — AB

## 2019-01-23 ENCOUNTER — Telehealth: Payer: Self-pay | Admitting: Adult Health

## 2019-01-23 NOTE — Telephone Encounter (Signed)
Patient called about Positive Covid test.  2 patient identifiers confirmed.  Date Tested: 01/21/2019  Date of Symptom onset: 01/20/2019 (patient has special needs, unsure of this date)   Symptoms:  Mild      Isolation Recommendations:  Patient understands the needs to stay in isolation for a total of 10 days from onset of symptom or 14 days total from date of testing if no symptom. Reviewed Masking.    Supportive Care Recommendations: Encouraged plenty of fluid intake, Tylenol per package directions, and to remain as active as possible.    Patient knows the health department may be in touch.    I answered all of patient's questions and all concerns addressed.  ER precautions reviewed.    Time Spent: 8 minutes  Wilber Bihari, NP

## 2019-04-04 ENCOUNTER — Telehealth: Payer: Self-pay | Admitting: Neurology

## 2019-04-04 NOTE — Telephone Encounter (Signed)
Voicemail left at 2:26p:  [name] said she was returning a call to [place]

## 2019-04-04 NOTE — Telephone Encounter (Signed)
LMVM for mother at her #, then other number listed memory was full.

## 2019-04-04 NOTE — Telephone Encounter (Signed)
Can we recheck CMP, ggt, can be done at pcp if prefer for elevated alkaline phosphatase.

## 2019-04-05 ENCOUNTER — Other Ambulatory Visit: Payer: Self-pay | Admitting: *Deleted

## 2019-04-05 DIAGNOSIS — Z5181 Encounter for therapeutic drug level monitoring: Secondary | ICD-10-CM

## 2019-04-05 DIAGNOSIS — R799 Abnormal finding of blood chemistry, unspecified: Secondary | ICD-10-CM

## 2019-04-05 DIAGNOSIS — G40209 Localization-related (focal) (partial) symptomatic epilepsy and epileptic syndromes with complex partial seizures, not intractable, without status epilepticus: Secondary | ICD-10-CM

## 2019-04-05 NOTE — Telephone Encounter (Signed)
I spoke to her mother on [place]. She is going to bring her back to our office on 04/06/19 for the repeat CMP. Order placed in [place].

## 2019-04-05 NOTE — Telephone Encounter (Signed)
Attempted call again. Left second message asking for a return call.

## 2019-04-05 NOTE — Telephone Encounter (Signed)
Attempted to reach mother back on both numbers in chart. I was able to leave a message, on [phone], requesting a call back.

## 2019-04-06 ENCOUNTER — Other Ambulatory Visit: Payer: Self-pay

## 2019-04-06 ENCOUNTER — Other Ambulatory Visit (INDEPENDENT_AMBULATORY_CARE_PROVIDER_SITE_OTHER): Payer: Self-pay

## 2019-04-06 DIAGNOSIS — Z5181 Encounter for therapeutic drug level monitoring: Secondary | ICD-10-CM

## 2019-04-06 DIAGNOSIS — G40209 Localization-related (focal) (partial) symptomatic epilepsy and epileptic syndromes with complex partial seizures, not intractable, without status epilepticus: Secondary | ICD-10-CM

## 2019-04-06 DIAGNOSIS — R799 Abnormal finding of blood chemistry, unspecified: Secondary | ICD-10-CM

## 2019-04-06 DIAGNOSIS — Z0289 Encounter for other administrative examinations: Secondary | ICD-10-CM

## 2019-04-07 ENCOUNTER — Telehealth: Payer: Self-pay

## 2019-04-07 LAB — COMPREHENSIVE METABOLIC PANEL
ALT: 31 IU/L (ref 0–32)
AST: 29 IU/L (ref 0–40)
Albumin/Globulin Ratio: 1.6 (ref 1.2–2.2)
Albumin: 4.5 g/dL (ref 3.8–4.8)
Alkaline Phosphatase: 270 IU/L — ABNORMAL HIGH (ref 39–117)
BUN/Creatinine Ratio: 13 (ref 9–23)
BUN: 11 mg/dL (ref 6–20)
Bilirubin Total: <0.2 mg/dL (ref 0.0–1.2)
CO2: 23 mmol/L (ref 20–29)
Calcium: 9.7 mg/dL (ref 8.7–10.2)
Chloride: 99 mmol/L (ref 96–106)
Creatinine, Ser: 0.87 mg/dL (ref 0.57–1.00)
GFR calc Af Amer: 101 mL/min/{1.73_m2} (ref >59)
GFR calc non Af Amer: 87 mL/min/{1.73_m2} (ref >59)
Globulin, Total: 2.8 g/dL (ref 1.5–4.5)
Glucose: 96 mg/dL (ref 65–99)
Potassium: 4.5 mmol/L (ref 3.5–5.2)
Sodium: 139 mmol/L (ref 134–144)
Total Protein: 7.3 g/dL (ref 6.0–8.5)

## 2019-04-07 NOTE — Telephone Encounter (Signed)
Called to go over pts recent labs per NP Sarah Slacks request. No answer Left a VM for her to call us back. Will message through MyChart.  "Let the patient know, alk phos is still elevated 270, I am adding on ggt, that let's us know if actually coming from the liver. I will be in touch next week when results." NP SS  

## 2019-04-08 NOTE — Telephone Encounter (Signed)
Pt's mother returned call. Please call back on Monday.

## 2019-04-11 ENCOUNTER — Other Ambulatory Visit: Payer: Self-pay | Admitting: *Deleted

## 2019-04-11 NOTE — Telephone Encounter (Signed)
Pt mother returned miss call  In regards to results

## 2019-04-11 NOTE — Telephone Encounter (Signed)
I was able to speak to the patient's mother (on [place]). She is aware that Judson Roch added an additional lab test to the blood work collected last week. She will have our office call her back, once the results are back.

## 2019-04-11 NOTE — Telephone Encounter (Signed)
I called and left the patient's mother a message [phone]) that Judson Roch ordered additional labs on the blood work collected last week. Our office will call her back with next step, once the add on test is resulted.

## 2019-04-13 ENCOUNTER — Telehealth: Payer: Self-pay | Admitting: *Deleted

## 2019-04-13 NOTE — Telephone Encounter (Signed)
Pt's mother called back. Please call back when available. [phone]

## 2019-04-13 NOTE — Telephone Encounter (Signed)
I have spoken with the patient's mother and she verbalized understanding of the information below.

## 2019-04-13 NOTE — Telephone Encounter (Signed)
Ref Range & Units 7 d ago   GGT 0 - 60 IU/L 500High    Message from Sarah Slack, NP:  GGT is elevated indicating the alk pho elevation is likely coming from the liver. This is not urgent, at next visit , let's discuss switching off the carbamazepine to possibly Vimpat.   Left message requesting patient's mother to call us back (Yolanda McLaurin on DPR). 

## 2019-04-14 LAB — SPECIMEN STATUS REPORT

## 2019-04-19 LAB — GAMMA GT: GGT: 500 IU/L — ABNORMAL HIGH (ref 0–60)

## 2019-04-19 LAB — SPECIMEN STATUS REPORT

## 2019-10-11 ENCOUNTER — Other Ambulatory Visit: Payer: Self-pay

## 2019-10-11 ENCOUNTER — Ambulatory Visit: Payer: Medicaid Other | Admitting: Neurology

## 2019-10-11 ENCOUNTER — Encounter: Payer: Self-pay | Admitting: Neurology

## 2019-10-11 VITALS — BP 126/82 | HR 76 | Ht 63.5 in | Wt 170.6 lb

## 2019-10-11 DIAGNOSIS — G40209 Localization-related (focal) (partial) symptomatic epilepsy and epileptic syndromes with complex partial seizures, not intractable, without status epilepticus: Secondary | ICD-10-CM

## 2019-10-11 DIAGNOSIS — R269 Unspecified abnormalities of gait and mobility: Secondary | ICD-10-CM

## 2019-10-11 DIAGNOSIS — Z8673 Personal history of transient ischemic attack (TIA), and cerebral infarction without residual deficits: Secondary | ICD-10-CM | POA: Diagnosis not present

## 2019-10-11 MED ORDER — LAMOTRIGINE 100 MG PO TABS: ORAL_TABLET | ORAL | 11 refills | Status: DC

## 2019-10-11 NOTE — Progress Notes (Signed)
I have read the note, and I agree with the clinical assessment and plan.  Avneet Ashmore K Yonas Bunda   

## 2019-10-11 NOTE — Patient Instructions (Signed)
Check blood work today Consider switching off the carbamazepine depending on blood work  Will call once blood results See you back in 1 year

## 2019-10-11 NOTE — Progress Notes (Signed)
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  She has seizure secondary to this.  She is on carbamazepine and Lamictal.  Last seizure was in February 2019, her speech became incoherent, lasted about 5 minutes, no tonic-clonic seizure activity in many years.  She does not drive a car, lives with her mother.  She has chronic elevated alkaline phosphatase, likely from long-term carbamazepine use. Alkaline phosphatase was 270.  She keeps up with her medicines, overall health has been good. They have started family exercise 4 days a week.  Presents today for evaluation accompanied by her mother.  HISTORY  10/11/2018 [name] is a 34 year old female with history of cerebrovascular disease, and stroke affecting the left brain with a right hemiparesis and mild gait disorder.  She has seizures secondary to this.  She remains on carbamazepine and Lamictal.  Her last seizure occurred in February 2019.  She does not drive a car.  She lives with her mother. She is able to manage her medications and perform all of her own ADLs.  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  She has not had recent seizure.  She indicates her overall health has been well. She has not had any falls.  She presents today for follow-up accompanied by her mother. She has routine follow-up with her primary care doctor. She denies any new problems or concerns.  She reports tolerability and compliance with her medications.  REVIEW OF SYSTEMS: Out of a complete 14 system review of symptoms, the patient complains only of the following symptoms, and all other reviewed systems are negative.  N/A  ALLERGIES: Allergies  Allergen Reactions  . Sulfa Antibiotics  Hives    HOME MEDICATIONS: Outpatient Medications Prior to Visit  Medication Sig Dispense Refill  . IRON PO Take 325 mg by mouth daily.    . Multiple Vitamin (MULTIVITAMIN) tablet Take 1 tablet by mouth daily.    . TEGRETOL-XR 200 MG 12 hr tablet TAKE ONE TABLET BY MOUTH EVERY MORNING AND TWO TABLETS EVERY EVENING. 90 tablet 11  . lamoTRIgine (LAMICTAL) 100 MG tablet TAKE 3 TABLETS BY MOUTH IN THE MORNING AND 3 TABLETS IN THE EVENING. 180 tablet 11   No facility-administered medications prior to visit.    PAST MEDICAL HISTORY: Past Medical History:  Diagnosis Date  . Developmental delay    mental age of 42, per mother  . Gait abnormality 04/07/2018  . History of stroke    age 8  . Seizures ([place])    last seizure 06/2014    PAST SURGICAL HISTORY: Past Surgical History:  Procedure Laterality Date  . BRAIN SURGERY    . MINOR EXCISION OF ORAL LESION N/A 05/01/2014   Procedure: EXCISION LIP MASS;  Surgeon: Leta Baptist, MD;  Location: [facility];  Service: ENT;  Laterality: N/A;    FAMILY HISTORY: Family History  Problem Relation Age of Onset  . Cancer Maternal Aunt        colon   . Asthma Maternal Grandmother   . Diabetes Maternal Grandmother   . Heart disease Maternal Grandfather   . Cancer Paternal Grandmother        lung    SOCIAL HISTORY: Social History   Socioeconomic History  .  Marital status: Single    Spouse name: Not on file  . Number of children: 0  . Years of education: 51  . Highest education level: Not on file  Occupational History  . Not on file  Tobacco Use  . Smoking status: Never Smoker  . Smokeless tobacco: Never Used  Vaping Use  . Vaping Use: Never used  Substance and Sexual Activity  . Alcohol use: No  . Drug use: No  . Sexual activity: Never    Birth control/protection: None  Other Topics Concern  . Not on file  Social History Narrative   Patient lives with mother Vanessa Zuniga.   Patient has no children.    Patient as a high  school education.   Patient is working.    Patient is single   Patient is left handed.   Patient does not drink caffeine.   Social Determinants of Health   Financial Resource Strain:   . Difficulty of Paying Living Expenses: Not on file  Food Insecurity:   . Worried About Charity fundraiser in the Last Year: Not on file  . Ran Out of Food in the Last Year: Not on file  Transportation Needs:   . Lack of Transportation (Medical): Not on file  . Lack of Transportation (Non-Medical): Not on file  Physical Activity:   . Days of Exercise per Week: Not on file  . Minutes of Exercise per Session: Not on file  Stress:   . Feeling of Stress : Not on file  Social Connections:   . Frequency of Communication with Friends and Family: Not on file  . Frequency of Social Gatherings with Friends and Family: Not on file  . Attends Religious Services: Not on file  . Active Member of Clubs or Organizations: Not on file  . Attends Archivist Meetings: Not on file  . Marital Status: Not on file  Intimate Partner Violence:   . Fear of Current or Ex-Partner: Not on file  . Emotionally Abused: Not on file  . Physically Abused: Not on file  . Sexually Abused: Not on file   PHYSICAL EXAM  Vitals:   10/11/19 1438  BP: 126/82  Pulse: 76  Weight: 170 lb 9.6 oz (77.4 kg)  Height: 5' 3.5" (1.613 m)   Body mass index is 29.75 kg/m.  Generalized: Well developed, in no acute distress  Neurological examination  Mentation: Alert oriented to time, place, most history is provided by her mother. Follows all commands speech and language fluent Cranial nerve II-XII: Pupils were equal round reactive to light. Extraocular movements were full, visual field were full on confrontational test. Facial sensation and strength were normal. Head turning and shoulder shrug  were normal and symmetric. Motor: Right hemiparesis, but strength is fairly well-maintained to all extremities, right hand has tendency to  stay in flexion Sensory: Sensory testing is intact to soft touch on all 4 extremities. No evidence of extinction is noted.  Coordination: Cerebellar testing reveals good finger-nose-finger and heel-to-shin bilaterally.  Gait and station: Mild circumduction type gait on the right Reflexes: Deep tendon reflexes are symmetric and normal bilaterally.   DIAGNOSTIC DATA (LABS, IMAGING, TESTING) - I reviewed patient records, labs, notes, testing and imaging myself where available.  Lab Results  Component Value Date   WBC 6.2 10/11/2018   HGB 12.1 10/11/2018   HCT 36.3 10/11/2018   MCV 85 10/11/2018   PLT 319 10/11/2018      Component Value Date/Time   NA  139 04/06/2019 1532   K 4.5 04/06/2019 1532   CL 99 04/06/2019 1532   CO2 23 04/06/2019 1532   GLUCOSE 96 04/06/2019 1532   BUN 11 04/06/2019 1532   CREATININE 0.87 04/06/2019 1532   CALCIUM 9.7 04/06/2019 1532   PROT 7.3 04/06/2019 1532   ALBUMIN 4.5 04/06/2019 1532   AST 29 04/06/2019 1532   ALT 31 04/06/2019 1532   ALKPHOS 270 (H) 04/06/2019 1532   BILITOT <0.2 04/06/2019 1532   GFRNONAA 87 04/06/2019 1532   GFRAA 101 04/06/2019 1532   No results found for: CHOL, HDL, LDLCALC, LDLDIRECT, TRIG, CHOLHDL No results found for: HGBA1C No results found for: VITAMINB12 No results found for: TSH   ASSESSMENT AND PLAN 34 y.o. year old female  has a past medical history of Developmental delay, Gait abnormality (04/07/2018), History of stroke, and Seizures ([place]). here with:  1.  History of seizures, well controlled 2.  History of stroke, right hemiparesis 3.  Gait disorder  -No recurrent seizure since February 2019 -Chronic elevated alkaline phosphatase -Check routine blood work, if continues to be elevated, consider switch from carbamazepine to possibly Vimpat -Continue Lamictal 300 mg twice a day -Call for recurrent seizure, follow-up in 1 year or sooner if needed  I spent 20 minutes of face-to-face and non-face-to-face time  with patient.  This included previsit chart review, lab review, study review, order entry, electronic health record documentation, patient education.  Butler Denmark, AGNP-C, DNP 10/11/2019, 3:06 PM [facility] [address], [place], [place] [address] [phone]

## 2019-10-12 ENCOUNTER — Telehealth: Payer: Self-pay | Admitting: Neurology

## 2019-10-12 ENCOUNTER — Encounter: Payer: Self-pay | Admitting: Neurology

## 2019-10-12 MED ORDER — VIMPAT 100 MG PO TABS: ORAL_TABLET | ORAL | 0 refills | Status: DC

## 2019-10-12 MED ORDER — LACOSAMIDE 50 MG PO TABS: ORAL_TABLET | ORAL | 0 refills | Status: DC

## 2019-10-12 MED ORDER — TEGRETOL-XR 200 MG PO TB12: ORAL_TABLET | ORAL | 0 refills | Status: DC

## 2019-10-12 NOTE — Telephone Encounter (Signed)
Spoke with pharmacist. She stated that they have received the RX for the Vimpat 50mg . She had a question about whether or not the patient would be needing the Tegretol as well. Advised her of the tapering in the MyChart message, she verbalized understanding.   Nothing further needed at time of call.

## 2019-10-12 NOTE — Telephone Encounter (Signed)
Spoke with patient's mother [name]. She verbalized that she received the medication instructions. Patient has been scheduled for 04/09/20 at 345pm with Judson Roch.   Nothing further needed at time of call.

## 2019-10-12 NOTE — Telephone Encounter (Signed)
Recent lab work shows continued elevation of alkaline phosphatase, now 320.  Likely related to long-term carbamazepine use.  She will continue on Lamictal 300 mg twice a day.  We will plan to wean her off carbamazepine and switch to Vimpat working up to 150 mg twice daily. I called her mom, and sent my chart message with the following instructions:   1. Start Vimpat 50 mg twice a day x 1 week with current dosing of Tegretol XR 200 mg am/400 mg pm  2. Increase Vimpat 100 mg twice a day x 1 week with current dosing of Tegretol XR 200 mg am/400 mg pm 3. Increase Vimpat 150 mg twice a day, decrease dose of Tegretol XR 200 mg twice daily x 1 week  4. Continue Vimpat 150 mg twice daily, decrease dose of Tegretol XR 200 mg daily x 1 week 5. Then stop the Tegretol XR completely remain on Vimpat 150 mg twice daily, Lamictal 300 mg twice daily   Vanessa Zuniga, will you please reschedule her a 6 month f/u vs 1 year, this can be done over my chart.   If she does well on Vimpat, in future can send the 150 mg tablet in.

## 2019-10-12 NOTE — Addendum Note (Signed)
Addended by: Suzzanne Cloud on: 10/12/2019 03:53 PM   Modules accepted: Orders

## 2019-10-12 NOTE — Telephone Encounter (Signed)
No problem, will switch from Vimpat 100 mg tablet to 50 mg tablet so cutting in half is not an issue.

## 2019-10-12 NOTE — Telephone Encounter (Signed)
[facility] called Lacosamide (VIMPAT) 100 MG TABS prescription said half a tablet, tablet can not be split in half. Would like a call back from the nurse.

## 2019-10-13 LAB — COMPREHENSIVE METABOLIC PANEL
ALT: 39 IU/L — ABNORMAL HIGH (ref 0–32)
AST: 27 IU/L (ref 0–40)
Albumin/Globulin Ratio: 1.7 (ref 1.2–2.2)
Albumin: 4.7 g/dL (ref 3.8–4.8)
Alkaline Phosphatase: 320 IU/L — ABNORMAL HIGH (ref 44–121)
BUN/Creatinine Ratio: 13 (ref 9–23)
BUN: 10 mg/dL (ref 6–20)
Bilirubin Total: <0.2 mg/dL (ref 0.0–1.2)
CO2: 26 mmol/L (ref 20–29)
Calcium: 9.9 mg/dL (ref 8.7–10.2)
Chloride: 98 mmol/L (ref 96–106)
Creatinine, Ser: 0.77 mg/dL (ref 0.57–1.00)
GFR calc Af Amer: 116 mL/min/{1.73_m2} (ref >59)
GFR calc non Af Amer: 100 mL/min/{1.73_m2} (ref >59)
Globulin, Total: 2.8 g/dL (ref 1.5–4.5)
Glucose: 91 mg/dL (ref 65–99)
Potassium: 4.3 mmol/L (ref 3.5–5.2)
Sodium: 138 mmol/L (ref 134–144)
Total Protein: 7.5 g/dL (ref 6.0–8.5)

## 2019-10-13 LAB — CBC WITH DIFFERENTIAL/PLATELET
Basophils Absolute: 0.1 10*3/uL (ref 0.0–0.2)
Basos: 1 %
EOS (ABSOLUTE): 0.1 10*3/uL (ref 0.0–0.4)
Eos: 1 %
Hematocrit: 35.9 % (ref 34.0–46.6)
Hemoglobin: 12.2 g/dL (ref 11.1–15.9)
Immature Grans (Abs): 0 10*3/uL (ref 0.0–0.1)
Immature Granulocytes: 0 %
Lymphocytes Absolute: 2.3 10*3/uL (ref 0.7–3.1)
Lymphs: 32 %
MCH: 28.3 pg (ref 26.6–33.0)
MCHC: 34 g/dL (ref 31.5–35.7)
MCV: 83 fL (ref 79–97)
Monocytes Absolute: 0.5 10*3/uL (ref 0.1–0.9)
Monocytes: 7 %
Neutrophils Absolute: 4.2 10*3/uL (ref 1.4–7.0)
Neutrophils: 59 %
Platelets: 348 10*3/uL (ref 150–450)
RBC: 4.31 x10E6/uL (ref 3.77–5.28)
RDW: 12.5 % (ref 11.7–15.4)
WBC: 7.1 10*3/uL (ref 3.4–10.8)

## 2019-10-13 LAB — LAMOTRIGINE LEVEL: Lamotrigine Lvl: 14.2 ug/mL (ref 2.0–20.0)

## 2019-10-13 LAB — CARBAMAZEPINE LEVEL, TOTAL: Carbamazepine (Tegretol), S: 8.2 ug/mL (ref 4.0–12.0)

## 2019-11-04 ENCOUNTER — Other Ambulatory Visit: Payer: Self-pay | Admitting: Neurology

## 2019-12-25 ENCOUNTER — Other Ambulatory Visit: Payer: Self-pay

## 2019-12-25 DIAGNOSIS — R42 Dizziness and giddiness: Secondary | ICD-10-CM | POA: Diagnosis not present

## 2019-12-25 DIAGNOSIS — R4182 Altered mental status, unspecified: Secondary | ICD-10-CM | POA: Diagnosis not present

## 2019-12-25 DIAGNOSIS — T426X5A Adverse effect of other antiepileptic and sedative-hypnotic drugs, initial encounter: Secondary | ICD-10-CM | POA: Diagnosis not present

## 2019-12-25 DIAGNOSIS — G40209 Localization-related (focal) (partial) symptomatic epilepsy and epileptic syndromes with complex partial seizures, not intractable, without status epilepticus: Secondary | ICD-10-CM | POA: Diagnosis not present

## 2019-12-25 DIAGNOSIS — R531 Weakness: Secondary | ICD-10-CM | POA: Diagnosis not present

## 2019-12-25 DIAGNOSIS — R2689 Other abnormalities of gait and mobility: Secondary | ICD-10-CM | POA: Diagnosis present

## 2019-12-26 ENCOUNTER — Emergency Department (HOSPITAL_COMMUNITY)
Admission: EM | Admit: 2019-12-26 | Discharge: 2019-12-26 | Disposition: A | Discharge status: Home / Self Care | Payer: Medicaid Other | Attending: Emergency Medicine | Admitting: Emergency Medicine

## 2019-12-26 ENCOUNTER — Emergency Department (HOSPITAL_COMMUNITY): Payer: Medicaid Other

## 2019-12-26 ENCOUNTER — Encounter (HOSPITAL_COMMUNITY): Payer: Self-pay | Admitting: Emergency Medicine

## 2019-12-26 ENCOUNTER — Other Ambulatory Visit: Payer: Self-pay

## 2019-12-26 ENCOUNTER — Telehealth: Payer: Self-pay | Admitting: Neurology

## 2019-12-26 DIAGNOSIS — G40209 Localization-related (focal) (partial) symptomatic epilepsy and epileptic syndromes with complex partial seizures, not intractable, without status epilepticus: Secondary | ICD-10-CM

## 2019-12-26 LAB — COMPREHENSIVE METABOLIC PANEL
ALT: 48 U/L — ABNORMAL HIGH (ref 0–44)
AST: 35 U/L (ref 15–41)
Albumin: 4.2 g/dL (ref 3.5–5.0)
Alkaline Phosphatase: 253 U/L — ABNORMAL HIGH (ref 38–126)
Anion gap: 8 (ref 5–15)
BUN: 10 mg/dL (ref 6–20)
CO2: 28 mmol/L (ref 22–32)
Calcium: 9.4 mg/dL (ref 8.9–10.3)
Chloride: 100 mmol/L (ref 98–111)
Creatinine, Ser: 0.86 mg/dL (ref 0.44–1.00)
GFR, Estimated: >60 mL/min (ref >60)
Glucose, Bld: 104 mg/dL — ABNORMAL HIGH (ref 70–99)
Potassium: 3.6 mmol/L (ref 3.5–5.1)
Sodium: 136 mmol/L (ref 135–145)
Total Bilirubin: 0.2 mg/dL — ABNORMAL LOW (ref 0.3–1.2)
Total Protein: 8 g/dL (ref 6.5–8.1)

## 2019-12-26 LAB — CBC WITH DIFFERENTIAL/PLATELET
Abs Immature Granulocytes: 0.02 10*3/uL (ref 0.00–0.07)
Basophils Absolute: 0.1 10*3/uL (ref 0.0–0.1)
Basophils Relative: 1 %
Eosinophils Absolute: 0.1 10*3/uL (ref 0.0–0.5)
Eosinophils Relative: 1 %
HCT: 37.2 % (ref 36.0–46.0)
Hemoglobin: 12 g/dL (ref 12.0–15.0)
Immature Granulocytes: 0 %
Lymphocytes Relative: 24 %
Lymphs Abs: 1.8 10*3/uL (ref 0.7–4.0)
MCH: 28.5 pg (ref 26.0–34.0)
MCHC: 32.3 g/dL (ref 30.0–36.0)
MCV: 88.4 fL (ref 80.0–100.0)
Monocytes Absolute: 0.5 10*3/uL (ref 0.1–1.0)
Monocytes Relative: 7 %
Neutro Abs: 5.2 10*3/uL (ref 1.7–7.7)
Neutrophils Relative %: 67 %
Platelets: ADEQUATE 10*3/uL (ref 150–400)
RBC: 4.21 MIL/uL (ref 3.87–5.11)
RDW: 12.8 % (ref 11.5–15.5)
WBC: 7.7 10*3/uL (ref 4.0–10.5)
nRBC: 0 % (ref 0.0–0.2)

## 2019-12-26 LAB — URINALYSIS, ROUTINE W REFLEX MICROSCOPIC
Bacteria, UA: NONE SEEN
Bilirubin Urine: NEGATIVE
Glucose, UA: NEGATIVE mg/dL
Ketones, ur: NEGATIVE mg/dL
Leukocytes,Ua: NEGATIVE
Nitrite: NEGATIVE
Protein, ur: NEGATIVE mg/dL
Specific Gravity, Urine: 1.013 (ref 1.005–1.030)
pH: 6 (ref 5.0–8.0)

## 2019-12-26 LAB — ETHANOL: Alcohol, Ethyl (B): <10 mg/dL (ref <10)

## 2019-12-26 LAB — RAPID URINE DRUG SCREEN, HOSP PERFORMED
Amphetamines: NOT DETECTED
Barbiturates: NOT DETECTED
Benzodiazepines: NOT DETECTED
Cocaine: NOT DETECTED
Opiates: NOT DETECTED
Tetrahydrocannabinol: NOT DETECTED

## 2019-12-26 LAB — PROTIME-INR
INR: 1 (ref 0.8–1.2)
Prothrombin Time: 12.5 seconds (ref 11.4–15.2)

## 2019-12-26 LAB — APTT: aPTT: 29 seconds (ref 24–36)

## 2019-12-26 MED ORDER — LORAZEPAM 0.5 MG PO TABS: 0.2500 mg | ORAL_TABLET | Freq: Every day | ORAL | 0 refills | Status: DC | PRN

## 2019-12-26 MED ORDER — LORAZEPAM 0.5 MG PO TABS
0.2500 mg | ORAL_TABLET | Freq: Once | ORAL | Status: AC
  Administered 2019-12-26: 03:00:00 0.25 mg via ORAL
  Filled 2019-12-26: qty 1

## 2019-12-26 NOTE — ED Notes (Signed)
Patient transported to CT 

## 2019-12-26 NOTE — Consult Note (Signed)
[facility] TeleNeurology Consult Services  Stat Consult  Date of Service:   12/26/2019 01:47:46  Diagnosis:     .  R41.82 - AMS (Altered Mental Status)  Impression: Altered mental status - likely partial seizure, possible breakthrough, also tapering of medications, consider ativan 0.25mg  prn for patient during bridge of antiepileptic taper, fu outpt Neuro  Our recommendations are outlined below.  Disposition: Neurology will sign off. Reconsult if Needed.  Free Text:   Additional Recommendations::    Metrics: [facility] Notification Time: 12/26/2019 01:45:51 Stamp Time: 12/26/2019 01:47:46 Callback Response Time: 12/26/2019 01:48:51   ----------------------------------------------------------------------------------------------------  Chief Complaint: weakness in legs  History of Present Illness: Patient is a 34 year old Female.  34 yo F h/o stroke with right hemiparesis, seizure disorder, on vimpat and lamictal presents with episode of off balance, incoherent speech, bilateral lower extremity weakness, witnessed by patient's mother, improved now. Vimpat dose has been decreasing, she has been decreasing dose due to dizziness. Pt fell yesterday due to legs giving out.      Examination: BP(130/95), Pulse(85), Blood Glucose(unknown)  Neuro Exam: General: Alert,Awake  Speech: Fluent:  Face: Symmetric:  Extraocular Movements: Intact:  Motor Exam: No Drift:  Coordination: Intact:   Patient / Family was informed the Neurology Consult would occur via TeleHealth consult by way of interactive audio and video telecommunications and consented to receiving care in this manner.  Patient is being evaluated for possible acute neurologic impairment and high probability of imminent or life - threatening deterioration.I spent total of 30 minutes providing care to this patient, including time for face to face visit via telemedicine, review of medical records,  imaging studies and discussion of findings with providers, the patient and / or family.   Dr Launa Grill   [facility] [phone]  Case [id number]

## 2019-12-26 NOTE — Discharge Instructions (Addendum)
Please call your neurologist this morning to let her know about your ED visit tonight.  Use the Ativan if needed if you feel like she is going to have another episode.  Recheck as needed.

## 2019-12-26 NOTE — ED Provider Notes (Signed)
[facility] EMERGENCY DEPARTMENT Provider Note   CSN: [id number] Arrival date & time: 12/25/19  2334   Time seen 1:14 AM  History Chief Complaint  Patient presents with  . Gait Problem    Vanessa Zuniga is a 34 y.o. female.  HPI   Mother states patient was sleeping in a chair in her room and mother tried to wake her up about 10 PM and had difficulty getting her to wake up.  When she finally did wake up her speech was very hard to understand and that lasted about 30 minutes.  She tried to have her get up to go to the bathroom and then come to the ED and patient was having trouble walking.  She states both of her legs felt numb.  Mother states she had to help her walk but she was able to move her legs.  She also complained of having a lot of dizziness.  She denies any headache.  Mother denies seeing any type of seizure activity.  Mother states she has had 2 grand mal seizures in the past but that was over 15 years ago.  She also has other seizures that are more of a staring nature.  Mother states she had been fine all day.  They recently had to take her off of her carbamazepine due to elevation of her liver test which she now is off of after a taper and was started on Vimpat.  She continues on Lamictal that she had been on before.  She has been having a lot of dizziness since they started the Vimpat and they have been trying to adjust her dose.  Mother states currently her speech is back to normal however she still seems wobbly with her gait.  Mother states child had a stroke when she was 27 years old.  She states there was no reason found for it.  She denies any family history of strokes.  PCP Lemmie Evens, MD   Past Medical History:  Diagnosis Date  . Developmental delay    mental age of 28, per mother  . Gait abnormality 04/07/2018  . History of stroke    age 59  . Seizures ([place])    last seizure 06/2014    Patient Active Problem List   Diagnosis Date Noted  . Gait abnormality  04/07/2018  . Well woman exam with routine gynecological exam 02/07/2016  . History of stroke 01/01/2016  . Partial epilepsy with impairment of consciousness ([place]) 11/12/2012  . Encounter for therapeutic drug monitoring 11/12/2012    Past Surgical History:  Procedure Laterality Date  . BRAIN SURGERY    . MINOR EXCISION OF ORAL LESION N/A 05/01/2014   Procedure: EXCISION LIP MASS;  Surgeon: Leta Baptist, MD;  Location: [place];  Service: ENT;  Laterality: N/A;     OB History    Gravida  0   Para  0   Term  0   Preterm  0   AB  0   Living  0     SAB  0   IAB  0   Ectopic  0   Multiple  0   Live Births              Family History  Problem Relation Age of Onset  . Cancer Maternal Aunt        colon   . Asthma Maternal Grandmother   . Diabetes Maternal Grandmother   . Heart disease Maternal Grandfather   .  Cancer Paternal Grandmother        lung    Social History   Tobacco Use  . Smoking status: Never Smoker  . Smokeless tobacco: Never Used  Vaping Use  . Vaping Use: Never used  Substance Use Topics  . Alcohol use: No  . Drug use: No    Home Medications Prior to Admission medications   Medication Sig Start Date End Date Taking? Authorizing Provider  IRON PO Take 325 mg by mouth daily.    [provider]  lacosamide (VIMPAT) 50 MG TABS tablet TAKE 3 TABLETS TWICE DAILY 11/07/19   Suzzanne Cloud, NP  lamoTRIgine (LAMICTAL) 100 MG tablet TAKE 3 TABLETS BY MOUTH IN THE MORNING AND 3 TABLETS IN THE EVENING. 10/11/19   Suzzanne Cloud, NP  LORazepam (ATIVAN) 0.5 MG tablet Take 0.5 tablets (0.25 mg total) by mouth daily as needed for seizure. 12/26/19   Rolland Porter, MD  Multiple Vitamin (MULTIVITAMIN) tablet Take 1 tablet by mouth daily.    [provider]  TEGRETOL-XR 200 MG 12 hr tablet TAKE ONE TABLET BY MOUTH EVERY MORNING AND TWO TABLETS EVERY EVENING. 10/12/19   Suzzanne Cloud, NP    Allergies    Sulfa  antibiotics  Review of Systems   Review of Systems  All other systems reviewed and are negative.   Physical Exam Updated Vital Signs BP 128/88 (BP Location: Right Arm)   Pulse 84   Temp 98.1 F (36.7 C) (Oral)   Resp 18   Ht 5\' 3"  (1.6 m)   Wt 73.5 kg   SpO2 100%   BMI 28.70 kg/m   Physical Exam Vitals and nursing note reviewed.  Constitutional:      General: She is not in acute distress.    Appearance: Normal appearance. She is normal weight. She is not ill-appearing or toxic-appearing.  HENT:     Head: Normocephalic and atraumatic.     Right Ear: External ear normal.     Left Ear: External ear normal.  Eyes:     Extraocular Movements: Extraocular movements intact.     Conjunctiva/sclera: Conjunctivae normal.     Pupils: Pupils are equal, round, and reactive to light.  Cardiovascular:     Rate and Rhythm: Normal rate and regular rhythm.     Pulses: Normal pulses.     Heart sounds: Normal heart sounds.  Pulmonary:     Effort: Pulmonary effort is normal. No respiratory distress.     Breath sounds: Normal breath sounds.  Musculoskeletal:        General: Normal range of motion.     Cervical back: Normal range of motion.  Skin:    General: Skin is warm and dry.     Capillary Refill: Capillary refill takes less than 2 seconds.  Neurological:     General: No focal deficit present.     Mental Status: She is alert. Mental status is at baseline.     Cranial Nerves: No cranial nerve deficit.     Comments: Patient knows her day and month of birth but does not know her year of birth which mother states is normal.  Patient keeps her right hand with her hands and I grip which mother states is typical.  She has good grips bilaterally.  She has no obvious motor weakness of her lower extremities.  Psychiatric:        Mood and Affect: Mood normal.        Behavior: Behavior normal.  ED Results / Procedures / Treatments   Labs (all labs ordered are listed, but only abnormal  results are displayed) Results for orders placed or performed during the hospital encounter of 12/26/19  Comprehensive metabolic panel  Result Value Ref Range   Sodium 136 135 - 145 mmol/L   Potassium 3.6 3.5 - 5.1 mmol/L   Chloride 100 98 - 111 mmol/L   CO2 28 22 - 32 mmol/L   Glucose, Bld 104 (H) 70 - 99 mg/dL   BUN 10 6 - 20 mg/dL   Creatinine, Ser 0.86 0.44 - 1.00 mg/dL   Calcium 9.4 8.9 - 10.3 mg/dL   Total Protein 8.0 6.5 - 8.1 g/dL   Albumin 4.2 3.5 - 5.0 g/dL   AST 35 15 - 41 U/L   ALT 48 (H) 0 - 44 U/L   Alkaline Phosphatase 253 (H) 38 - 126 U/L   Total Bilirubin 0.2 (L) 0.3 - 1.2 mg/dL   GFR, Estimated >60 >60 mL/min   Anion gap 8 5 - 15  CBC with Differential  Result Value Ref Range   WBC 7.7 4.0 - 10.5 K/uL   RBC 4.21 3.87 - 5.11 MIL/uL   Hemoglobin 12.0 12.0 - 15.0 g/dL   HCT 37.2 36.0 - 46.0 %   MCV 88.4 80.0 - 100.0 fL   MCH 28.5 26.0 - 34.0 pg   MCHC 32.3 30.0 - 36.0 g/dL   RDW 12.8 11.5 - 15.5 %   Platelets  150 - 400 K/uL    PLATELET CLUMPS NOTED ON SMEAR, COUNT APPEARS ADEQUATE   nRBC 0.0 0.0 - 0.2 %   Neutrophils Relative % 67 %   Neutro Abs 5.2 1.7 - 7.7 K/uL   Lymphocytes Relative 24 %   Lymphs Abs 1.8 0.7 - 4.0 K/uL   Monocytes Relative 7 %   Monocytes Absolute 0.5 0.1 - 1.0 K/uL   Eosinophils Relative 1 %   Eosinophils Absolute 0.1 0.0 - 0.5 K/uL   Basophils Relative 1 %   Basophils Absolute 0.1 0.0 - 0.1 K/uL   Immature Granulocytes 0 %   Abs Immature Granulocytes 0.02 0.00 - 0.07 K/uL  Urinalysis, Routine w reflex microscopic Urine, Clean Catch  Result Value Ref Range   Color, Urine YELLOW YELLOW   APPearance CLEAR CLEAR   Specific Gravity, Urine 1.013 1.005 - 1.030   pH 6.0 5.0 - 8.0   Glucose, UA NEGATIVE NEGATIVE mg/dL   Hgb urine dipstick MODERATE (A) NEGATIVE   Bilirubin Urine NEGATIVE NEGATIVE   Ketones, ur NEGATIVE NEGATIVE mg/dL   Protein, ur NEGATIVE NEGATIVE mg/dL   Nitrite NEGATIVE NEGATIVE   Leukocytes,Ua NEGATIVE NEGATIVE    RBC / HPF 21-50 0 - 5 RBC/hpf   WBC, UA 0-5 0 - 5 WBC/hpf   Bacteria, UA NONE SEEN NONE SEEN   Squamous Epithelial / LPF 0-5 0 - 5   Mucus PRESENT   Ethanol  Result Value Ref Range   Alcohol, Ethyl (B) <10 <10 mg/dL  Urine rapid drug screen (hosp performed)  Result Value Ref Range   Opiates NONE DETECTED NONE DETECTED   Cocaine NONE DETECTED NONE DETECTED   Benzodiazepines NONE DETECTED NONE DETECTED   Amphetamines NONE DETECTED NONE DETECTED   Tetrahydrocannabinol NONE DETECTED NONE DETECTED   Barbiturates NONE DETECTED NONE DETECTED  Protime-INR  Result Value Ref Range   Prothrombin Time 12.5 11.4 - 15.2 seconds   INR 1.0 0.8 - 1.2  APTT  Result Value Ref Range  aPTT 29 24 - 36 seconds   Laboratory interpretation all normal except persistent elevation of alkaline phosphate, hematuria however mother states she is at the end of her menses    EKG None  Radiology CT HEAD WO CONTRAST  Result Date: 12/26/2019 CLINICAL DATA:  Initial evaluation for acute dizziness. Bilateral weakness. EXAM: CT HEAD WITHOUT CONTRAST TECHNIQUE: Contiguous axial images were obtained from the base of the skull through the vertex without intravenous contrast. COMPARISON:  Previous brain MRI from 03/22/2007. FINDINGS: Brain: Mild atrophy for patient age. Remote infarct involving the left basal ganglia is seen. No acute intracranial hemorrhage. No acute large vessel territory infarct. No mass lesion, midline shift or mass effect. No hydrocephalus or extra-axial fluid collection. Vascular: No hyperdense vessel. Skull: Scalp soft tissues demonstrate no acute finding. Previous right temporal craniectomy. Sinuses/Orbits: Visualized globes and orbital soft tissues demonstrate no acute finding. Visualized paranasal sinuses are clear. Mastoid air cells and middle ear cavities are well pneumatized and free of fluid. Other: None. IMPRESSION: 1. No acute intracranial abnormality. 2. 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 3. Previous right temporal craniectomy. Electronically Signed   By: Jeannine Boga M.D.   On: 12/26/2019 02:48    Procedures Procedures (including critical care time)  Medications Ordered in ED Medications  LORazepam (ATIVAN) tablet 0.25 mg (0.25 mg Oral Given 12/26/19 1191)    ED Course  I have reviewed the triage vital signs and the nursing notes.  Pertinent labs & imaging results that were available during my care of the patient were reviewed by me and considered in my medical decision making (see chart for details).    MDM Rules/Calculators/A&P                          Patient symptoms are concerning for either a unwitnessed seizure, TIA, drug level problem.  CT of head was done.  Teleneurology consult was ordered.  1:50 AM Judson Roch, nurse manager from [employer] called to verify reason for consult.  2:37 AM teleneurologist called to discuss her impression.  She feels the patient probably had a complex seizure.  She states her mother told her that patient is now back to baseline and the patient agrees.  She recommends giving her Ativan 0.25 mg orally now and a prescription for a few for any breakthrough episodes until she can contact her neurologist and get her medication adjusted.  She states she can be discharged once all of her tests have resulted.  She does want to make sure there is no underlying electrolyte problem or elevated white count to suggest infection.  Patient's tests have resulted and are normal, she was discharged home with her mother with a prescription for Ativan 0.25 mg if they feel like she is going to have another seizure until her neurologist can adjust her medications.  Final Clinical Impression(s) / ED Diagnoses Final diagnoses:  Partial symptomatic epilepsy with complex partial seizures, not intractable, without status epilepticus ([place])    Rx / DC Orders ED Discharge Orders         Ordered    LORazepam  (ATIVAN) 0.5 MG tablet  Daily PRN        12/26/19 0449         Plan discharge  Rolland Porter, MD, Barbette Or, MD 12/26/19 [phone]

## 2019-12-26 NOTE — ED Notes (Signed)
PT and mother educated with DC instructions and verbalized complete understanding, denies questions at this time. PT IV removed fully intact, dressing applied. Pt self ambulated to exit with steady gait and mother at side.

## 2019-12-26 NOTE — ED Triage Notes (Signed)
Per pts mother pt has had several medication changes iver the past few months. Tonight pt was difficult to wake up, then her speech was altered, and she complained of difficulty walking. Mother states she is concerned that she had a seizure.

## 2019-12-27 ENCOUNTER — Other Ambulatory Visit: Payer: Self-pay | Admitting: Neurology

## 2019-12-27 LAB — LAMOTRIGINE LEVEL: Lamotrigine Lvl: 30.3 ug/mL — ABNORMAL HIGH (ref 2.0–20.0)

## 2019-12-27 MED ORDER — LEVETIRACETAM 500 MG PO TABS: ORAL_TABLET | ORAL | 5 refills | Status: DC

## 2019-12-27 NOTE — Telephone Encounter (Signed)
December 27, 2019  Vanessa Zuniga to Suzzanne Cloud, NP      7:59 AM Got it, thank you. I'll keep you posted.   Suzzanne Cloud, NP to Vanessa Zuniga      7:47 AM I will send in [place] go ahead and start 500 mg twice daily x 1 week; then take 750 mg twice daily x 1 week; after that, decrease Vimpat to 100 mg twice daily x 1 week, then 50 mg twice daily x 1 week then stop. Continue Lamictal at current dose. Let me know if you have any other questions! Vanessa Zuniga   Last read by Vanessa Zuniga at 7:55 AM on 12/27/2019.      7:15 AM You routed this conversation to Suzzanne Cloud, NP    Vanessa Zuniga to Suzzanne Cloud, NP      6:17 AM No she has not. So willing to try something new because Vimpat is truly not working. Thank you so much for getting back with me quickly.   Suzzanne Cloud, NP to Vanessa Zuniga      6:10 AM Has Vanessa Zuniga ever been on Keppra, I think this would be a good replacement for the Vimpat? If not, I'Zuniga like to switch, let me know so I can tell you how to start Keppra and wean down Vimpat.  Vanessa Zuniga   Last read by Vanessa Zuniga at 7:55 AM on 12/27/2019. December 26, 2019      3:18 PM You routed this conversation to Suzzanne Cloud, NP    Vanessa Zuniga to Suzzanne Cloud, NP      3:02 PM Thank you so much. Hildred Laser, NP to Vanessa Zuniga      2:58 PM Vanessa Zuniga, Let me discuss this with Dr. Jannifer Franklin and get back to you about medication adjustment. Sounds like the Vimpat is not a good match for her. I will give you an answer soon. Vanessa Zuniga   Last read by Vanessa Zuniga at 7:55 AM on 12/27/2019.  Me to Suzzanne Cloud, NP      10:19 AM I dont have anything soon for you. SY    Karrie Meres, [name] to [place], Charlynne Cousins, NP      8:35 AM Good morning, just wanted to let you know that Vanessa Zuniga ended up in the ER last night. They  did a cat scan and had blood work done. They also gave her a Ativan  (Lorazepam) at the hospital and a sent in a prescription to our pharmacy. We haven't been right since the Vimpat. She keeps experiencing dizziness. Last night it was a seizure, couldn't speak clear words nor stand or walk on her own.  I didn't know which way was the best to contact you.   This is note from 10-12-19 pt email, connected to this re: her seizures and medication management.

## 2019-12-27 NOTE — Progress Notes (Signed)
I sent in [place] to start, will plan to wean off Vimpat.

## 2019-12-27 NOTE — Telephone Encounter (Signed)
Recently in the ER for what was felt to be a seizure, her mom tried to wake her up at 10 PM, had difficulty, her speech was difficult to understand, lasted about 30 minutes, she was having trouble walking to the bathroom, complained of dizziness. Lamictal and Vimpat levels are in process.   Currently taking Vimpat 100/150 mg BID, on 150 mg BID had a lot of dizziness but still dizzy since coming off carbamazepine and switching to Vimpat.  Continue Lamictal. Let's switch to Keppra (I don't see that she has been on this before). Start 500 mg twice daily for 1 week, then take 750 mg twice daily for 1 week. At that point start to wean down Vimpat to 100 mg BID for 1 week, then 50 mg twice daily for 1 week then stop.   Plan:  Keppra 750 mg BID, Lamictal 300 mg BID.

## 2019-12-30 LAB — LACOSAMIDE: Lacosamide: 7.5 ug/mL (ref 5.0–10.0)

## 2020-04-09 ENCOUNTER — Ambulatory Visit: Payer: Medicaid Other | Admitting: Neurology

## 2020-04-09 ENCOUNTER — Encounter: Payer: Self-pay | Admitting: Neurology

## 2020-04-09 VITALS — BP 114/77 | HR 80 | Ht 65.0 in | Wt 164.0 lb

## 2020-04-09 DIAGNOSIS — G40209 Localization-related (focal) (partial) symptomatic epilepsy and epileptic syndromes with complex partial seizures, not intractable, without status epilepticus: Secondary | ICD-10-CM

## 2020-04-09 DIAGNOSIS — Z8673 Personal history of transient ischemic attack (TIA), and cerebral infarction without residual deficits: Secondary | ICD-10-CM

## 2020-04-09 MED ORDER — LAMOTRIGINE 100 MG PO TABS: ORAL_TABLET | ORAL | 4 refills | Status: DC

## 2020-04-09 MED ORDER — LEVETIRACETAM 500 MG PO TABS: ORAL_TABLET | ORAL | 4 refills | Status: DC

## 2020-04-09 NOTE — Patient Instructions (Addendum)
Continue current medications Check labs today  Call for seizure activity  See you back in 1 year

## 2020-04-09 NOTE — Progress Notes (Signed)
I have read the note, and I agree with the clinical assessment and plan.  Kensi Karr K Corah Willeford   

## 2020-04-09 NOTE — Progress Notes (Signed)
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  She has secondary seizures.  She has been taken off carbamazepine due to elevated alkaline phosphatase.  She could not tolerate Vimpat secondary to side effects.  She had a seizure in December on Vimpat/Lamictal, her mom tried to wake her, had difficulty, her speech was difficult to understand, had trouble walking to the bathroom, was dizzy.  In the hospital, Vimpat level was 7.5, Lamictal level was 30.3.  CT head showed no acute intercranial abnormality.  We then discontinued Vimpat, switched to Keppra 750 mg twice daily.  She has since done well, no adverse effects or seizures.  Here today for evaluation accompanied by her mother.  The patient is able to manage her own medications.  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  She has seizure secondary to this.  She is on carbamazepine and Lamictal.  Last seizure was in February 2019, her speech became incoherent, lasted about 5 minutes, no tonic-clonic seizure activity in many years.  She does not drive a car, lives with her mother.  She has chronic elevated alkaline phosphatase, likely from long-term carbamazepine use. Alkaline phosphatase was 270.  She keeps up with her medicines, overall health has been good. They have started family exercise 4 days a week.  Presents today for evaluation accompanied by her mother.  HISTORY  10/11/2018 [name] is a 35 year old female with history of cerebrovascular disease, and stroke affecting the left brain with a right hemiparesis and mild gait disorder.  She has seizures  secondary to this.  She remains on carbamazepine and Lamictal.  Her last seizure occurred in February 2019.  She does not drive a car.  She lives with her mother. She is able to manage her medications and perform all of her own ADLs.  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  She has not had recent seizure.  She indicates her overall health has been well. She has not had any falls.  She presents today for follow-up accompanied by her mother. She has routine follow-up with her primary care doctor. She denies any new problems or concerns.  She reports tolerability and compliance with her medications.  REVIEW OF SYSTEMS: Out of a complete 14 system review of symptoms, the patient complains only of the following symptoms, and all other reviewed systems are negative.  N/A  ALLERGIES: Allergies  Allergen Reactions  . Sulfa Antibiotics Hives    HOME MEDICATIONS: Outpatient Medications Prior to Visit  Medication Sig Dispense Refill  . LORazepam (ATIVAN) 0.5 MG tablet Take 0.5 tablets (0.25 mg total) by mouth daily as needed for seizure. 5 tablet 0  . Multiple Vitamin (MULTIVITAMIN) tablet Take 1 tablet by mouth daily.    Marland Kitchen lamoTRIgine (LAMICTAL) 100 MG tablet TAKE 3 TABLETS BY MOUTH IN THE MORNING AND 3 TABLETS IN THE EVENING. 180 tablet 11  . levETIRAcetam (KEPPRA) 500 MG tablet Take 1 tablet twice daily x 1 week, then take 1.5 tablets twice daily 120 tablet 5  . IRON PO Take 325 mg by mouth daily.    Marland Kitchen lacosamide (VIMPAT) 50 MG TABS tablet  TAKE 3 TABLETS TWICE DAILY 180 tablet 5  . TEGRETOL-XR 200 MG 12 hr tablet TAKE ONE TABLET BY MOUTH EVERY MORNING AND TWO TABLETS EVERY EVENING. 90 tablet 0   No facility-administered medications prior to visit.    PAST MEDICAL HISTORY: Past Medical History:  Diagnosis Date  . Developmental delay    mental age of 37, per mother  . Gait abnormality 04/07/2018  . History of stroke    age 84  . Seizures ([place])    last seizure  06/2014    PAST SURGICAL HISTORY: Past Surgical History:  Procedure Laterality Date  . BRAIN SURGERY    . MINOR EXCISION OF ORAL LESION N/A 05/01/2014   Procedure: EXCISION LIP MASS;  Surgeon: Leta Baptist, MD;  Location: [facility];  Service: ENT;  Laterality: N/A;    FAMILY HISTORY: Family History  Problem Relation Age of Onset  . Cancer Maternal Aunt        colon   . Asthma Maternal Grandmother   . Diabetes Maternal Grandmother   . Heart disease Maternal Grandfather   . Cancer Paternal Grandmother        lung    SOCIAL HISTORY: Social History   Socioeconomic History  . Marital status: Single    Spouse name: Not on file  . Number of children: 0  . Years of education: 38  . Highest education level: Not on file  Occupational History  . Not on file  Tobacco Use  . Smoking status: Never Smoker  . Smokeless tobacco: Never Used  Vaping Use  . Vaping Use: Never used  Substance and Sexual Activity  . Alcohol use: No  . Drug use: No  . Sexual activity: Never    Birth control/protection: None  Other Topics Concern  . Not on file  Social History Narrative   Patient lives with mother [name].   Patient has no children.    Patient as a high school education.   Patient is working.    Patient is single   Patient is left handed.   Patient does not drink caffeine.   Social Determinants of Health   Financial Resource Strain: Not on file  Food Insecurity: Not on file  Transportation Needs: Not on file  Physical Activity: Not on file  Stress: Not on file  Social Connections: Not on file  Intimate Partner Violence: Not on file   PHYSICAL EXAM  Vitals:   04/09/20 1538  BP: 114/77  Pulse: 80  Weight: 164 lb (74.4 kg)  Height: 5\' 5"  (1.651 m)   Body mass index is 27.29 kg/m.  Generalized: Well developed, in no acute distress  Neurological examination  Mentation: Alert oriented to time, place, most history is provided by her mother. Follows all commands  speech and language fluent Cranial nerve II-XII: Pupils were equal round reactive to light. Extraocular movements were full, visual field were full on confrontational test. Facial sensation and strength were normal. Head turning and shoulder shrug  were normal and symmetric. Motor: Right hemiparesis, but strength is fairly well-maintained to all extremities, right hand has tendency to stay in flexion Sensory: Sensory testing is intact to soft touch on all 4 extremities. No evidence of extinction is noted.  Coordination: Cerebellar testing reveals good finger-nose-finger and heel-to-shin bilaterally.  Gait and station: Mild circumduction type gait on the right Reflexes: Deep tendon reflexes are symmetric and normal bilaterally.   DIAGNOSTIC DATA (LABS, IMAGING, TESTING) - I reviewed patient records, labs, notes, testing and  imaging myself where available.  Lab Results  Component Value Date   WBC 7.7 12/26/2019   HGB 12.0 12/26/2019   HCT 37.2 12/26/2019   MCV 88.4 12/26/2019   PLT  12/26/2019    PLATELET CLUMPS NOTED ON SMEAR, COUNT APPEARS ADEQUATE      Component Value Date/Time   NA 136 12/26/2019 0200   NA 138 10/11/2019 1516   K 3.6 12/26/2019 0200   CL 100 12/26/2019 0200   CO2 28 12/26/2019 0200   GLUCOSE 104 (H) 12/26/2019 0200   BUN 10 12/26/2019 0200   BUN 10 10/11/2019 1516   CREATININE 0.86 12/26/2019 0200   CALCIUM 9.4 12/26/2019 0200   PROT 8.0 12/26/2019 0200   PROT 7.5 10/11/2019 1516   ALBUMIN 4.2 12/26/2019 0200   ALBUMIN 4.7 10/11/2019 1516   AST 35 12/26/2019 0200   ALT 48 (H) 12/26/2019 0200   ALKPHOS 253 (H) 12/26/2019 0200   BILITOT 0.2 (L) 12/26/2019 0200   BILITOT <0.2 10/11/2019 1516   GFRNONAA >60 12/26/2019 0200   GFRAA 116 10/11/2019 1516   No results found for: CHOL, HDL, LDLCALC, LDLDIRECT, TRIG, CHOLHDL No results found for: HGBA1C No results found for: VITAMINB12 No results found for: TSH   ASSESSMENT AND PLAN 35 y.o. year old female   has a past medical history of Developmental delay, Gait abnormality (04/07/2018), History of stroke, and Seizures ([place]). here with:  1.  History of seizures, well controlled 2.  History of stroke, right hemiparesis 3.  Gait disorder  -Seizure in December 2021, while on Vimpat, and Lamictal -Side effects on Vimpat, discontinued in December, switched to [place] -Thus far, no recurrent seizure or adverse effect -Continue Keppra 750 mg twice daily -Continue Lamictal 300 mg twice daily -Check CMP, Lamictal level (was 30 in the ER), compare alkaline phosphatase levels now that carbamazepine has been discontinued -Call for seizure activity, otherwise follow-up 1 year or sooner if needed  I spent 20 minutes of face-to-face and non-face-to-face time with patient.  This included previsit chart review, lab review, study review, order entry, electronic health record documentation, patient education.  Butler Denmark, AGNP-C, DNP 04/09/2020, 3:58 PM [facility] [address] [place], [place] [address] [phone]

## 2020-04-10 ENCOUNTER — Telehealth: Payer: Self-pay | Admitting: Neurology

## 2020-04-10 LAB — COMPREHENSIVE METABOLIC PANEL
ALT: 55 IU/L — ABNORMAL HIGH (ref 0–32)
AST: 43 IU/L — ABNORMAL HIGH (ref 0–40)
Albumin/Globulin Ratio: 1.7 (ref 1.2–2.2)
Albumin: 4.5 g/dL (ref 3.8–4.8)
Alkaline Phosphatase: 305 IU/L — ABNORMAL HIGH (ref 44–121)
BUN/Creatinine Ratio: 10 (ref 9–23)
BUN: 9 mg/dL (ref 6–20)
Bilirubin Total: <0.2 mg/dL (ref 0.0–1.2)
CO2: 24 mmol/L (ref 20–29)
Calcium: 10.1 mg/dL (ref 8.7–10.2)
Chloride: 100 mmol/L (ref 96–106)
Creatinine, Ser: 0.88 mg/dL (ref 0.57–1.00)
Globulin, Total: 2.6 g/dL (ref 1.5–4.5)
Glucose: 93 mg/dL (ref 65–99)
Potassium: 3.9 mmol/L (ref 3.5–5.2)
Sodium: 139 mmol/L (ref 134–144)
Total Protein: 7.1 g/dL (ref 6.0–8.5)
eGFR: 88 mL/min/{1.73_m2} (ref >59)

## 2020-04-10 LAB — LAMOTRIGINE LEVEL: Lamotrigine Lvl: 21.4 ug/mL (ref 2.0–20.0)

## 2020-04-10 NOTE — Telephone Encounter (Signed)
Pt's mother, Jone Baseman (on [place]) returned phone call. Would like a call back.

## 2020-04-10 NOTE — Telephone Encounter (Signed)
Talked with Dr. Jannifer Franklin about this, hold off on recheck of trough level but keep an eye on symptoms of Lamictal toxicity, none were seen yesterday. Please send labs to PCP, concerned for continued elevation alk phos, despite carbamazepine discontinuation, now elevation of liver enzymes AST 43, ALT 55, alk phos 305. This needs evaluation by PCP, have her mother schedule an appointment with PCP. Also, change follow-up to 6 months instead of 1 year here, see if Dr. Jannifer Franklin has any openings/1 of NPs. Please make sure she is taking her medications correctly, ex: not taking too much Lamictal. Very few meds on list, I see no birth control. Any signs of Lamictal toxicity needs to let us know.   Lamictal level 21.4 (normal 2-20).

## 2020-04-10 NOTE — Telephone Encounter (Signed)
I called and spoke to mother.  Pt doing well since no sz since on keppra and off vimpat.  Took her lamotrigine 0730-8 and then had blook drawn after seen here about 1600 (8 hours).  Do you still want to do trough??

## 2020-04-10 NOTE — Telephone Encounter (Signed)
Called and LMVM for mother or pt to return call about lab results.

## 2020-04-10 NOTE — Telephone Encounter (Signed)
Please call the patient, Lamictal blood level came back elevated 21.4, was also elevated back in December when in the ER was 30.3.  Was is related to timing of the dose, it does not appear the Lamictal dose has been changed, has been stable for years at 300 mg twice daily, please verify this with her mother.  The patient manages her medications.  Can she recheck a trough level first thing in the morning tomorrow?

## 2020-04-11 NOTE — Telephone Encounter (Signed)
Fax confirmation received Dr. Karie Kirks [phone] lab results. Phone message.

## 2020-04-11 NOTE — Telephone Encounter (Signed)
Spoke to mother of pt.  She is with [name] when she is taking her medications.  I relayed plan for pt.  Pt had seen pcp Dr. Karie Kirks one month ago.  No labs drawn per mother.  I will fax to him she is f/u with pcp office to see if needs to do anything for Leiah. She will watch for any lamictal toxicity, slurred speech, off balance, rash.  No trough needed at this time.  Mother verbalized understanding.

## 2020-05-17 ENCOUNTER — Ambulatory Visit (INDEPENDENT_AMBULATORY_CARE_PROVIDER_SITE_OTHER): Payer: Medicaid Other | Admitting: Adult Health

## 2020-05-17 ENCOUNTER — Other Ambulatory Visit (HOSPITAL_COMMUNITY)
Admission: RE | Admit: 2020-05-17 | Discharge: 2020-05-17 | Disposition: A | Discharge status: Home / Self Care | Payer: Medicaid Other | Source: Ambulatory Visit | Attending: Adult Health | Admitting: Adult Health

## 2020-05-17 ENCOUNTER — Other Ambulatory Visit: Payer: Self-pay

## 2020-05-17 ENCOUNTER — Encounter: Payer: Self-pay | Admitting: Adult Health

## 2020-05-17 VITALS — BP 115/76 | HR 91 | Ht 63.5 in | Wt 166.0 lb

## 2020-05-17 DIAGNOSIS — Z8673 Personal history of transient ischemic attack (TIA), and cerebral infarction without residual deficits: Secondary | ICD-10-CM

## 2020-05-17 DIAGNOSIS — Z0000 Encounter for general adult medical examination without abnormal findings: Secondary | ICD-10-CM

## 2020-05-17 DIAGNOSIS — Z01419 Encounter for gynecological examination (general) (routine) without abnormal findings: Secondary | ICD-10-CM | POA: Diagnosis not present

## 2020-05-17 DIAGNOSIS — Z87898 Personal history of other specified conditions: Secondary | ICD-10-CM | POA: Diagnosis not present

## 2020-05-17 NOTE — Progress Notes (Signed)
Patient ID: Vanessa Zuniga, female   DOB: 02-22-1985, 35 y.o.   MRN: [id number] History of Present Illness: Vanessa Zuniga is a 35 year old black female,single, G0P0, in for a well woman gyn exam and pap, she has a history of seizures and had a left brain stroke with chronic right hemiparesis. Her mom is with her.  She sees Dr Jannifer Franklin  at [facility]. PCP is Dr Karie Kirks.   Current Medications, Allergies, Past Medical History, Past Surgical History, Family History and Social History were reviewed in [employer] record.     Review of Systems: Patient denies any headaches, hearing loss, fatigue, blurred vision, shortness of breath, chest pain, abdominal pain, problems with bowel movements, urination, or intercourse.(never had sex) No joint pain or mood swings. Periods are good she says.   Physical Exam:BP 115/76 (BP Location: Left Arm, Patient Position: Sitting, Cuff Size: Normal)   Pulse 91   Ht 5' 3.5" (1.613 m)   Wt 166 lb (75.3 kg)   LMP 04/27/2020   BMI 28.94 kg/m  General:  Well developed, well nourished, no acute distress Skin:  Warm and dry Neck:  Midline trachea, normal thyroid, good ROM, no lymphadenopathy Lungs; Clear to auscultation bilaterally Breast:  No dominant palpable mass, retraction, or nipple discharge Cardiovascular: Regular rate and rhythm Abdomen:  Soft, non tender, no hepatosplenomegaly Pelvic:  External genitalia is normal in appearance, no lesions.  The vagina is normal in appearance, pediatric speculum used. Urethra has no lesions or masses. The cervix is nulliparous and poorly visualized, pap with HR HPV genotyping performed.  Uterus is felt to be normal size, shape, and contour.  No adnexal masses or tenderness noted.Bladder is non tender, no masses felt. Extremities/musculoskeletal:  No swelling or varicosities noted, no clubbing or cyanosis, has right hemiparesis and contraction of right hand  Psych:  No mood changes, alert and cooperative,seems happy AA  is 0 Fall risk is moderate PHQ 9 score is 0 GAD 7 score is 0  Upstream - 05/17/20 1507      Pregnancy Intention Screening   Does the patient want to become pregnant in the next year? No    Does the patient's partner want to become pregnant in the next year? No    Would the patient like to discuss contraceptive options today? No      Contraception Wrap Up   Current Method Abstinence    End Method Abstinence    Contraception Counseling Provided No         Examination chaperoned by Levy Pupa LPN   Impression and Plan:  1. Routine general medical examination at a health care facility Pap sent  2. Encounter for gynecological examination with Papanicolaou smear of cervix Pap sent Physical with PCP Labs with PCP or neurologist Pap in 3 years if normal Mammogram at 35  3. History of stroke Sees Dr Jannifer Franklin  4. History of seizures Sees Dr Jannifer Franklin

## 2020-05-23 LAB — CYTOLOGY - PAP
Adequacy: ABSENT
Comment: NEGATIVE
Diagnosis: NEGATIVE
High risk HPV: NEGATIVE

## 2020-06-05 ENCOUNTER — Telehealth: Payer: Self-pay | Admitting: Neurology

## 2020-06-05 NOTE — Telephone Encounter (Signed)
I called and spoke to Vanessa Zuniga with Dr. Vickey Sages office.  They do have read only in epic.  She was able to see the notes on 29 March where Sarah NP spoke with Dr. Jannifer Franklin.  She will address with Dr. Karie Kirks.  Will call us back as needed.

## 2020-06-05 NOTE — Telephone Encounter (Signed)
Dr. Vickey Sages office Judson Roch) called, Dr. Karie Kirks wandering if patient's elevated labs are due to medications. Would like a call from the nurse.  Contact info: [phone]

## 2020-09-27 ENCOUNTER — Ambulatory Visit: Payer: Self-pay | Admitting: Neurology

## 2020-10-01 ENCOUNTER — Encounter: Payer: Self-pay | Admitting: Neurology

## 2020-10-01 ENCOUNTER — Ambulatory Visit: Payer: Medicaid Other | Admitting: Neurology

## 2020-10-01 VITALS — BP 124/82 | HR 74 | Ht 64.0 in | Wt 171.0 lb

## 2020-10-01 DIAGNOSIS — Z5181 Encounter for therapeutic drug level monitoring: Secondary | ICD-10-CM

## 2020-10-01 DIAGNOSIS — G40209 Localization-related (focal) (partial) symptomatic epilepsy and epileptic syndromes with complex partial seizures, not intractable, without status epilepticus: Secondary | ICD-10-CM | POA: Diagnosis not present

## 2020-10-01 MED ORDER — LEVETIRACETAM 750 MG PO TABS: 750.0000 mg | ORAL_TABLET | Freq: Two times a day (BID) | ORAL | 3 refills | Status: DC

## 2020-10-01 NOTE — Progress Notes (Signed)
Reason for visit: Seizures  Vanessa Zuniga is an 35 y.o. female  History of present illness:  Vanessa Zuniga is a 35 year old left-handed black female with a history of cerebrovascular disease with a prior stroke affecting the left brain with a right hemiparesis.  The patient has seizures associated with this.  She had been on carbamazepine in the past but has had elevation in the alkaline phosphatase enzyme, she has come off of carbamazepine but it appears that her enzyme levels are still elevated.  She last had a seizure on 26 December 2019 as she was coming down off of the Vimpat that she did not tolerate.  She currently is on a combination of Lamictal and Keppra with good tolerance.  She has not had any further seizure spells.  She lives with her mother, and she does not maintain gainful employment.  She does not operate a motor vehicle.  She comes here for further evaluation.  Past Medical History:  Diagnosis Date   Developmental delay    mental age of 60, per mother   Gait abnormality 04/07/2018   History of stroke    age 77   Seizures ([place])    last seizure 06/2014    Past Surgical History:  Procedure Laterality Date   BRAIN SURGERY     MINOR EXCISION OF ORAL LESION N/A 05/01/2014   Procedure: EXCISION LIP MASS;  Surgeon: Leta Baptist, MD;  Location: [facility];  Service: ENT;  Laterality: N/A;    Family History  Problem Relation Age of Onset   Cancer Maternal Aunt        colon    Asthma Maternal Grandmother    Diabetes Maternal Grandmother    Heart disease Maternal Grandfather    Cancer Paternal Grandmother        lung    Social history:  reports that she has never smoked. She has never used smokeless tobacco. She reports that she does not drink alcohol and does not use drugs.    Allergies  Allergen Reactions   Sulfa Antibiotics Hives    Medications:  Prior to Admission medications   Medication Sig Start Date End Date Taking? Authorizing Provider   Ferrous Sulfate (IRON PO) Take by mouth.    [provider]  lamoTRIgine (LAMICTAL) 100 MG tablet TAKE 3 TABLETS BY MOUTH IN THE MORNING AND 3 TABLETS IN THE EVENING. 04/09/20   Suzzanne Cloud, NP  levETIRAcetam (KEPPRA) 500 MG tablet Take 1.5 tablets twice daily 04/09/20   Suzzanne Cloud, NP  LORazepam (ATIVAN) 0.5 MG tablet Take 0.5 tablets (0.25 mg total) by mouth daily as needed for seizure. 12/26/19   Rolland Porter, MD  Multiple Vitamin (MULTIVITAMIN) tablet Take 1 tablet by mouth daily.    [provider]    ROS:  Out of a complete 14 system review of symptoms, the patient complains only of the following symptoms, and all other reviewed systems are negative.  Seizure Right-sided weakness  Blood pressure 124/82, pulse 74, height '5\' 4"'$  (1.626 m), weight 171 lb (77.6 kg).  Physical Exam  General: The patient is alert and cooperative at the time of the examination.  Skin: No significant peripheral edema is noted.   Neurologic Exam  Mental status: The patient is alert and oriented x 3 at the time of the examination. The patient has apparent normal recent and remote memory, with an apparently normal attention span and concentration ability.   Cranial nerves: Facial symmetry is present.  Speech is normal, no aphasia or dysarthria is noted. Extraocular movements are full. Visual fields are full.  Motor: The patient has good strength in all 4 extremities.  There is some dystonic posturing of the right arm with slight flexion, dystonic posturing of the right hand with flexion at the wrist.  Sensory examination: Soft touch sensation is symmetric on the face, arms, and legs.  Coordination: The patient has good finger-nose-finger and heel-to-shin bilaterally.  The patient does not straighten the elbow out completely with finger-nose-finger with the right arm.  Gait and station: The patient has a normal gait. Tandem gait is slightly unsteady. Romberg is negative. No drift is  seen.  Reflexes: Deep tendon reflexes are notable for slight increase in the right biceps and right knee jerk reflex as compared to the left.   CT head 12/26/19:  IMPRESSION: 1. No acute intracranial abnormality. 2. Mild cerebral atrophy for age with remote left basal ganglia infarct. 3. Previous right temporal craniectomy.   * CT scan images were reviewed online. I agree with the written report.    Assessment/Plan:  1.  Cerebrovascular disease, right hemiparesis  2.  Seizures  The patient will continue her lamotrigine and Keppra.  The Keppra will be switched over to a 750 mg tablet taking 1 twice daily.  A prescription was sent in.  She will have blood work done today.  She will follow-up here in 1 year, sooner if needed.  In the future, she can be seen through Dr. April Manson.  Jill Alexanders MD 10/01/2020 7:30 AM  [facility] [address] [place], [place] [address]  Phone [phone] Fax [phone]

## 2020-10-04 ENCOUNTER — Telehealth: Payer: Self-pay | Admitting: Neurology

## 2020-10-04 DIAGNOSIS — R7989 Other specified abnormal findings of blood chemistry: Secondary | ICD-10-CM

## 2020-10-04 LAB — COMPREHENSIVE METABOLIC PANEL
ALT: 46 IU/L — ABNORMAL HIGH (ref 0–32)
AST: 42 IU/L — ABNORMAL HIGH (ref 0–40)
Albumin/Globulin Ratio: 1.7 (ref 1.2–2.2)
Albumin: 4.5 g/dL (ref 3.8–4.8)
Alkaline Phosphatase: 361 IU/L — ABNORMAL HIGH (ref 44–121)
BUN/Creatinine Ratio: 10 (ref 9–23)
BUN: 9 mg/dL (ref 6–20)
Bilirubin Total: 0.2 mg/dL (ref 0.0–1.2)
CO2: 25 mmol/L (ref 20–29)
Calcium: 9.8 mg/dL (ref 8.7–10.2)
Chloride: 101 mmol/L (ref 96–106)
Creatinine, Ser: 0.87 mg/dL (ref 0.57–1.00)
Globulin, Total: 2.6 g/dL (ref 1.5–4.5)
Glucose: 87 mg/dL (ref 65–99)
Potassium: 5.1 mmol/L (ref 3.5–5.2)
Sodium: 138 mmol/L (ref 134–144)
Total Protein: 7.1 g/dL (ref 6.0–8.5)
eGFR: 88 mL/min/{1.73_m2} (ref >59)

## 2020-10-04 LAB — CBC WITH DIFFERENTIAL/PLATELET
Basophils Absolute: 0.1 10*3/uL (ref 0.0–0.2)
Basos: 1 %
EOS (ABSOLUTE): 0.1 10*3/uL (ref 0.0–0.4)
Eos: 2 %
Hematocrit: 35.8 % (ref 34.0–46.6)
Hemoglobin: 11.7 g/dL (ref 11.1–15.9)
Immature Grans (Abs): 0 10*3/uL (ref 0.0–0.1)
Immature Granulocytes: 0 %
Lymphocytes Absolute: 2.1 10*3/uL (ref 0.7–3.1)
Lymphs: 32 %
MCH: 27.3 pg (ref 26.6–33.0)
MCHC: 32.7 g/dL (ref 31.5–35.7)
MCV: 84 fL (ref 79–97)
Monocytes Absolute: 0.5 10*3/uL (ref 0.1–0.9)
Monocytes: 8 %
Neutrophils Absolute: 3.6 10*3/uL (ref 1.4–7.0)
Neutrophils: 57 %
Platelets: 376 10*3/uL (ref 150–450)
RBC: 4.28 x10E6/uL (ref 3.77–5.28)
RDW: 12 % (ref 11.7–15.4)
WBC: 6.4 10*3/uL (ref 3.4–10.8)

## 2020-10-04 LAB — LAMOTRIGINE LEVEL: Lamotrigine Lvl: 29.4 ug/mL (ref 2.0–20.0)

## 2020-10-04 LAB — GAMMA GT: GGT: 515 IU/L — ABNORMAL HIGH (ref 0–60)

## 2020-10-04 LAB — LEVETIRACETAM LEVEL: Levetiracetam Lvl: 21.1 ug/mL (ref 10.0–40.0)

## 2020-10-04 MED ORDER — LAMOTRIGINE 100 MG PO TABS: ORAL_TABLET | ORAL | 4 refills | Status: DC

## 2020-10-04 NOTE — Telephone Encounter (Signed)
I called the patient and talk with the mother.  The canalicular enzymes for the liver remain elevated, gradually getting higher even off of carbamazepine.  Lamotrigine can affect the liver but usually this occurs within a month or 2 after starting the medication and is associated with a fulminant hepatitis elevating the SGOT and SGPT.  In this case, only [place] her enzymes are significantly elevated.  I do not think that the lamotrigine is the cause of this.  I am not clear what the etiology is, I will refer the patient to a gastroenterologist.  The lamotrigine level is toxic, we will reduce the dose to 200 mg in the morning and 300 mg in the evening.

## 2020-10-10 ENCOUNTER — Encounter: Payer: Self-pay | Admitting: Physician Assistant

## 2020-10-11 ENCOUNTER — Ambulatory Visit: Payer: Medicaid Other | Admitting: Neurology

## 2020-10-30 ENCOUNTER — Ambulatory Visit: Payer: Medicaid Other | Admitting: Physician Assistant

## 2020-11-09 ENCOUNTER — Other Ambulatory Visit (INDEPENDENT_AMBULATORY_CARE_PROVIDER_SITE_OTHER): Payer: Medicaid Other

## 2020-11-09 ENCOUNTER — Encounter: Payer: Self-pay | Admitting: Nurse Practitioner

## 2020-11-09 ENCOUNTER — Ambulatory Visit (INDEPENDENT_AMBULATORY_CARE_PROVIDER_SITE_OTHER): Payer: Medicaid Other | Admitting: Nurse Practitioner

## 2020-11-09 VITALS — BP 120/80 | HR 76 | Ht 63.5 in | Wt 171.2 lb

## 2020-11-09 DIAGNOSIS — R945 Abnormal results of liver function studies: Secondary | ICD-10-CM | POA: Diagnosis not present

## 2020-11-09 LAB — IBC PANEL
Iron: 199 ug/dL — ABNORMAL HIGH (ref 42–145)
Saturation Ratios: 52.3 % — ABNORMAL HIGH (ref 20.0–50.0)
TIBC: 380.8 ug/dL (ref 250.0–450.0)
Transferrin: 272 mg/dL (ref 212.0–360.0)

## 2020-11-09 LAB — FERRITIN: Ferritin: 31.6 ng/mL (ref 10.0–291.0)

## 2020-11-09 NOTE — Progress Notes (Signed)
ASSESSMENT AND PLAN    # 35 yo female referred for abnormal liver chemistries. AST and ALT chronically elevated but less never more than 2x ULN. More concerning is the progressive elevation of alkaline phosphatase / elevated GGT despite discontinuation of carbamazepine nearly Vanessa year ago. She has been on Lamictal for quite some time ( > 2 years) and recently started Keppra. Although less common than some of the other anti-epileptic medications, Lamictal has been associated with Dili  --Obtain complete hepatic serologic workup. If unrevealing then will need abdominal imaging ( none since normal [place] in 2018) and +/- liver biopsy.   HISTORY OF PRESENT ILLNESS     Chief Complaint : Abnormal liver test  Vanessa Zuniga is Vanessa 35 y.o. 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  See PMH below for any additional history.   Patient referred by Neurologist for abnormal liver chemistries. She is here with her mother . Liver chemistries abnormalities date back several years.  Alk phos has been elevated since at least 2014 though has risen progressively over the years. GGT elevated in 500's.  Bilirubin consistently normal.  AST and ALT generally elevated less than 2x ULN.    Zuniga has no history of abdominal pain or other GI symptoms  Non-drinker. Takes oral iron ( chronic). Menses are normal. Takes Vanessa  MV and prescribed meds.  No known history of viral hepatitis.  No [place] of liver disease . Over the years Vanessa Zuniga has been on Vanessa variety of anti-seizure medications including Lamictal, carbamazepine, Vimpat.  Carbamazepine discontinued about Vanessa year ago due to rising alkaline phosphatase level.  From what I read she did not tolerate Vimpat so changed to [place] in December 2021.  Despite being off carbamazepine her alkaline phosphatase has continued to rise.  She is currently on Lamictal and Keppra  with good control of seizures.   Data Reviewed: Most recent labs from 10/01/20 GGT 515. Alk phos 361, AST 42, ALT 45  Abdominal US in 2018 for elevated LFTS - unremarkable gallbladder, CBD 2 mm, no liver abnormalities.  Portal vein patent on Doppler with normal direction of blood flow towards the liver.    Past Medical History:  Diagnosis Date   Developmental delay    mental age of 74, per mother   Elevated LFTs    Gait abnormality 04/07/2018   History of stroke    age 41   Seizures ([place])    last seizure 06/2014     Past Surgical History:  Procedure Laterality Date   BRAIN SURGERY     finger biopsy Right    MINOR EXCISION OF ORAL LESION N/Vanessa 05/01/2014   Procedure: EXCISION LIP MASS;  Surgeon: Leta Baptist, MD;  Location: [facility];  Service: ENT;  Laterality: N/Vanessa;   Family History  Problem Relation Age of Onset   Colon polyps Mother    Asthma Maternal Grandmother    Diabetes Maternal Grandmother    Heart disease Maternal Grandfather    Lung cancer Paternal Grandmother    Colon cancer Maternal Aunt    Social History   Tobacco Use   Smoking status: Never   Smokeless tobacco: Never  Vaping Use   Vaping Use: Never used  Substance Use Topics   Alcohol use: No   Drug use: No   Current Outpatient Medications  Medication Sig Dispense Refill   Ferrous  Sulfate (IRON PO) Take by mouth.     lamoTRIgine (LAMICTAL) 100 MG tablet TAKE 2 TABLETS BY MOUTH IN THE MORNING AND 3 TABLETS IN THE EVENING. 540 tablet 4   levETIRAcetam (KEPPRA) 750 MG tablet Take 1 tablet (750 mg total) by mouth 2 (two) times daily. 180 tablet 3   LORazepam (ATIVAN) 0.5 MG tablet Take 0.5 tablets (0.25 mg total) by mouth daily as needed for seizure. 5 tablet 0   Multiple Vitamin (MULTIVITAMIN) tablet Take 1 tablet by mouth daily.     No current facility-administered medications for this visit.   Allergies  Allergen Reactions   Sulfa Antibiotics Hives     Review of Systems: All  systems reviewed and negative except where noted in HPI.    PHYSICAL EXAM :    Wt Readings from Last 3 Encounters:  11/09/20 171 lb 4 oz (77.7 kg)  10/01/20 171 lb (77.6 kg)  05/17/20 166 lb (75.3 kg)    BP 120/80 (BP Location: Left Arm, Patient Position: Sitting, Cuff Size: Normal)   Pulse 76   Ht 5' 3.5" (1.613 m) Comment: height measured without shoes  Wt 171 lb 4 oz (77.7 kg)   LMP 10/29/2020   BMI 29.86 kg/m  Constitutional:  Generally well appearing black female in no acute distress. Psychiatric: Pleasant. Normal mood and affect. Behavior is normal. EENT: Pupils normal.  Conjunctivae are normal. No scleral icterus. Neck supple.  Cardiovascular: Normal rate, regular rhythm. No edema Pulmonary/chest: Effort normal and breath sounds normal. No wheezing, rales or rhonchi. Abdominal: Soft, nondistended, nontender. Bowel sounds active throughout. There are no masses palpable. No hepatomegaly. Neurological: Alert and oriented to person place and time. Skin: Skin is warm and dry. No rashes noted.  [name], NP  11/09/2020, 11:30 AM  Cc:  Referring Provider Margette Fast, MD

## 2020-11-09 NOTE — Patient Instructions (Signed)
If you are age 36 or younger, your body mass index should be between 19-25. Your Body mass index is 29.86 kg/m. If this is out of the aformentioned range listed, please consider follow up with your Primary Care Provider.   The [facility] providers would like to encourage you to use [facility] to communicate with providers for non-urgent requests or questions.  Due to long hold times on the telephone, sending your provider a message by [facility] may be faster and more efficient way to get a response. Please allow 48 business hours for a response.  Please remember that this is for non-urgent requests/questions.  LABS:  Lab work has been ordered for you today. Our lab is located in the basement. Press "B" on the elevator. The lab is located at the first door on the left as you exit the elevator.  HEALTHCARE LAWS AND MY CHART RESULTS: Due to recent changes in healthcare laws, you may see the results of your imaging and laboratory studies on MyChart before your provider has had a chance to review them.   We understand that in some cases there may be results that are confusing or concerning to you. Not all laboratory results come back in the same time frame and the provider may be waiting for multiple results in order to interpret others.  Please give [place] 48 hours in order for your provider to thoroughly review all the results before contacting the office for clarification of your results.   We will be in touch once all the test results come back.  It was great seeing you today! Thank you for entrusting me with your care and choosing [facility].  [name], NP

## 2020-11-13 ENCOUNTER — Telehealth: Payer: Self-pay

## 2020-11-13 DIAGNOSIS — R945 Abnormal results of liver function studies: Secondary | ICD-10-CM

## 2020-11-13 NOTE — Telephone Encounter (Signed)
Author: Milus Banister, MD Service: Gastroenterology Author Type: Physician  Filed: 11/13/2020  7:20 AM Encounter Date: 11/09/2020 Status: Signed  Editor: Milus Banister, MD (Physician)                 I agree with the above note, plan.  Last set of LFTs was about 6 weeks ago.  Lets repeat those now with c-Met, CBC, coags.  I saw that all of her extra serologic work-up was essentially negative.  Lets also repeat ultrasound of her liver.  Thanks   Zeplin Aleshire, see above.

## 2020-11-13 NOTE — Telephone Encounter (Signed)
The pt mother has been advised and will bring the pt in for repeat labs at her convenience.  She is aware that she will get a call from the schedulers to set up the [place].  The orders have been entered.

## 2020-11-13 NOTE — Telephone Encounter (Signed)
-----   Message from Milus Banister, MD sent at 11/13/2020  7:18 AM EDT -----    ----- Message ----- From: Willia Craze, NP Sent: 11/09/2020   3:13 PM EDT To: Milus Banister, MD

## 2020-11-13 NOTE — Progress Notes (Signed)
I agree with the above note, plan.  Last set of LFTs was about 6 weeks ago.  Lets repeat those now with c-Met, CBC, coags.  I saw that all of her extra serologic work-up was essentially negative.  Lets also repeat ultrasound of her liver.  Thanks  Patty, see above.

## 2020-11-14 ENCOUNTER — Other Ambulatory Visit (INDEPENDENT_AMBULATORY_CARE_PROVIDER_SITE_OTHER): Payer: Medicaid Other

## 2020-11-14 DIAGNOSIS — R945 Abnormal results of liver function studies: Secondary | ICD-10-CM

## 2020-11-14 LAB — COMPREHENSIVE METABOLIC PANEL
ALT: 55 U/L — ABNORMAL HIGH (ref 0–35)
AST: 42 U/L — ABNORMAL HIGH (ref 0–37)
Albumin: 4.8 g/dL (ref 3.5–5.2)
Alkaline Phosphatase: 351 U/L — ABNORMAL HIGH (ref 39–117)
BUN: 11 mg/dL (ref 6–23)
CO2: 30 mEq/L (ref 19–32)
Calcium: 10.3 mg/dL (ref 8.4–10.5)
Chloride: 100 mEq/L (ref 96–112)
Creatinine, Ser: 0.92 mg/dL (ref 0.40–1.20)
GFR: 80.2 mL/min (ref >60.00)
Glucose, Bld: 80 mg/dL (ref 70–99)
Potassium: 3.6 mEq/L (ref 3.5–5.1)
Sodium: 139 mEq/L (ref 135–145)
Total Bilirubin: 0.3 mg/dL (ref 0.2–1.2)
Total Protein: 8.3 g/dL (ref 6.0–8.3)

## 2020-11-14 LAB — HEPATITIS A ANTIBODY, TOTAL: Hepatitis A AB,Total: NONREACTIVE

## 2020-11-14 LAB — CBC WITH DIFFERENTIAL/PLATELET
Basophils Absolute: 0.1 10*3/uL (ref 0.0–0.1)
Basophils Relative: 1.3 % (ref 0.0–3.0)
Eosinophils Absolute: 0.1 10*3/uL (ref 0.0–0.7)
Eosinophils Relative: 1.8 % (ref 0.0–5.0)
HCT: 38.3 % (ref 36.0–46.0)
Hemoglobin: 12.6 g/dL (ref 12.0–15.0)
Lymphocytes Relative: 35.6 % (ref 12.0–46.0)
Lymphs Abs: 2.4 10*3/uL (ref 0.7–4.0)
MCHC: 32.9 g/dL (ref 30.0–36.0)
MCV: 84.4 fl (ref 78.0–100.0)
Monocytes Absolute: 0.5 10*3/uL (ref 0.1–1.0)
Monocytes Relative: 6.6 % (ref 3.0–12.0)
Neutro Abs: 3.8 10*3/uL (ref 1.4–7.7)
Neutrophils Relative %: 54.7 % (ref 43.0–77.0)
Platelets: 407 10*3/uL — ABNORMAL HIGH (ref 150.0–400.0)
RBC: 4.54 Mil/uL (ref 3.87–5.11)
RDW: 13.1 % (ref 11.5–15.5)
WBC: 6.8 10*3/uL (ref 4.0–10.5)

## 2020-11-14 LAB — HEPATITIS B SURFACE ANTIBODY,QUALITATIVE: Hep B S Ab: REACTIVE — AB

## 2020-11-14 LAB — HEPATIC FUNCTION PANEL
ALT: 55 U/L — ABNORMAL HIGH (ref 0–35)
AST: 42 U/L — ABNORMAL HIGH (ref 0–37)
Albumin: 4.8 g/dL (ref 3.5–5.2)
Alkaline Phosphatase: 351 U/L — ABNORMAL HIGH (ref 39–117)
Bilirubin, Direct: 0.1 mg/dL (ref 0.0–0.3)
Total Bilirubin: 0.3 mg/dL (ref 0.2–1.2)
Total Protein: 8.3 g/dL (ref 6.0–8.3)

## 2020-11-14 LAB — PROTIME-INR
INR: 1 ratio (ref 0.8–1.0)
Prothrombin Time: 11.4 s (ref 9.6–13.1)

## 2020-11-14 LAB — HEPATITIS B CORE ANTIBODY, TOTAL: Hep B Core Total Ab: NONREACTIVE

## 2020-11-14 LAB — HEPATITIS B SURFACE ANTIGEN: Hepatitis B Surface Ag: NONREACTIVE

## 2020-11-14 LAB — HEPATITIS C ANTIBODY
Hepatitis C Ab: NONREACTIVE
SIGNAL TO CUT-OFF: 0.04 (ref <1.00)

## 2020-11-14 LAB — ANA: Anti Nuclear Antibody (ANA): NEGATIVE

## 2020-11-14 LAB — CERULOPLASMIN: Ceruloplasmin: 48 mg/dL (ref 18–53)

## 2020-11-14 LAB — MITOCHONDRIAL ANTIBODIES: Mitochondrial M2 Ab, IgG: <=20 U (ref <=20.0)

## 2020-11-14 LAB — ALPHA-1-ANTITRYPSIN: A-1 Antitrypsin, Ser: 185 mg/dL (ref 83–199)

## 2020-11-14 LAB — ANTI-SMOOTH MUSCLE ANTIBODY, IGG: Actin (Smooth Muscle) Antibody (IGG): <20 U (ref <20)

## 2020-11-15 LAB — IRON AND TIBC
Iron Saturation: 19 % (ref 15–55)
Iron: 62 ug/dL (ref 27–159)
Total Iron Binding Capacity: 327 ug/dL (ref 250–450)
UIBC: 265 ug/dL (ref 131–425)

## 2020-11-28 ENCOUNTER — Other Ambulatory Visit: Payer: Self-pay

## 2020-11-28 ENCOUNTER — Ambulatory Visit (HOSPITAL_COMMUNITY)
Admission: RE | Admit: 2020-11-28 | Discharge: 2020-11-28 | Disposition: A | Discharge status: Home / Self Care | Payer: Medicaid Other | Source: Ambulatory Visit | Attending: Gastroenterology | Admitting: Gastroenterology

## 2020-11-28 DIAGNOSIS — R945 Abnormal results of liver function studies: Secondary | ICD-10-CM | POA: Insufficient documentation

## 2020-12-05 ENCOUNTER — Other Ambulatory Visit: Payer: Self-pay

## 2020-12-05 DIAGNOSIS — R945 Abnormal results of liver function studies: Secondary | ICD-10-CM

## 2020-12-10 ENCOUNTER — Other Ambulatory Visit (INDEPENDENT_AMBULATORY_CARE_PROVIDER_SITE_OTHER): Payer: Medicaid Other

## 2020-12-10 DIAGNOSIS — R945 Abnormal results of liver function studies: Secondary | ICD-10-CM

## 2020-12-10 LAB — HEPATIC FUNCTION PANEL
ALT: 45 U/L — ABNORMAL HIGH (ref 0–35)
AST: 42 U/L — ABNORMAL HIGH (ref 0–37)
Albumin: 4.5 g/dL (ref 3.5–5.2)
Alkaline Phosphatase: 310 U/L — ABNORMAL HIGH (ref 39–117)
Bilirubin, Direct: 0 mg/dL (ref 0.0–0.3)
Total Bilirubin: 0.2 mg/dL (ref 0.2–1.2)
Total Protein: 7.8 g/dL (ref 6.0–8.3)

## 2020-12-14 ENCOUNTER — Other Ambulatory Visit: Payer: Self-pay

## 2020-12-14 DIAGNOSIS — R945 Abnormal results of liver function studies: Secondary | ICD-10-CM

## 2020-12-25 ENCOUNTER — Ambulatory Visit (HOSPITAL_COMMUNITY)
Admission: RE | Admit: 2020-12-25 | Discharge: 2020-12-25 | Disposition: A | Discharge status: Home / Self Care | Payer: Medicaid Other | Source: Ambulatory Visit | Attending: Gastroenterology | Admitting: Gastroenterology

## 2020-12-25 ENCOUNTER — Other Ambulatory Visit: Payer: Self-pay

## 2020-12-25 DIAGNOSIS — R945 Abnormal results of liver function studies: Secondary | ICD-10-CM | POA: Insufficient documentation

## 2020-12-25 MED ORDER — GADOBUTROL 1 MMOL/ML IV SOLN
8.0000 mL | Freq: Once | INTRAVENOUS | Status: AC | PRN
  Administered 2020-12-25: 8 mL via INTRAVENOUS

## 2020-12-31 ENCOUNTER — Other Ambulatory Visit: Payer: Self-pay

## 2020-12-31 DIAGNOSIS — R945 Abnormal results of liver function studies: Secondary | ICD-10-CM

## 2021-01-22 ENCOUNTER — Other Ambulatory Visit (INDEPENDENT_AMBULATORY_CARE_PROVIDER_SITE_OTHER): Payer: Medicaid Other

## 2021-01-22 DIAGNOSIS — R945 Abnormal results of liver function studies: Secondary | ICD-10-CM

## 2021-01-22 LAB — HEPATIC FUNCTION PANEL
ALT: 35 U/L (ref 0–35)
AST: 28 U/L (ref 0–37)
Albumin: 4.3 g/dL (ref 3.5–5.2)
Alkaline Phosphatase: 346 U/L — ABNORMAL HIGH (ref 39–117)
Bilirubin, Direct: 0.1 mg/dL (ref 0.0–0.3)
Total Bilirubin: 0.3 mg/dL (ref 0.2–1.2)
Total Protein: 7.6 g/dL (ref 6.0–8.3)

## 2021-01-23 ENCOUNTER — Ambulatory Visit (INDEPENDENT_AMBULATORY_CARE_PROVIDER_SITE_OTHER): Payer: Medicaid Other | Admitting: Gastroenterology

## 2021-01-23 ENCOUNTER — Encounter: Payer: Self-pay | Admitting: Gastroenterology

## 2021-01-23 VITALS — BP 120/78 | HR 68 | Ht 63.5 in | Wt 170.1 lb

## 2021-01-23 DIAGNOSIS — R945 Abnormal results of liver function studies: Secondary | ICD-10-CM

## 2021-01-23 NOTE — Progress Notes (Signed)
Review of pertinent gastrointestinal problems: 1.  Elevated liver tests.  For many many years, chronically elevated alk phos, AST and ALT.  On and off of numerous antiseizure medicines.   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  She is here with both of her parents today.  She has developmental delay.  She has a history of seizures and has been on and off of many different seizure medicines over the years.  We discussed her elevated liver tests and the fact we do not have a firm diagnosis for them yet.  Liver disease does not run in the family.  She is not an alcohol drinker.  She takes Tylenol only every once in a while, quite rarely.  She is on no herbal medicines.  She takes a multivitamin and iron once daily.  Keppra is not reported to cause liver test abnormalities,   Lamictal has been reported to cause "hepatic failure" but she certainly does not have hepatic failure  ROS: complete GI ROS as described in HPI, all other review negative.  Constitutional:  No unintentional weight loss   Past Medical History:  Diagnosis Date   Developmental delay    mental age of 41, per mother   Elevated LFTs    Gait abnormality 04/07/2018   History of stroke    age 74   Seizures  ([facility])    last seizure 06/2014    Past Surgical History:  Procedure Laterality Date   BRAIN SURGERY     finger biopsy Right    MINOR EXCISION OF ORAL LESION N/A 05/01/2014   Procedure: EXCISION LIP MASS;  Surgeon: Newman Pies, MD;  Location: [facility];  Service: ENT;  Laterality: N/A;    Current Outpatient Medications  Medication Sig Dispense Refill   Ferrous Sulfate (IRON PO) Take by mouth.     lamoTRIgine (LAMICTAL) 100 MG tablet TAKE 2 TABLETS BY MOUTH IN THE MORNING AND 3 TABLETS IN THE EVENING. (Patient taking differently: TAKE 3 TABLETS BY MOUTH IN THE MORNING AND 3 TABLETS IN THE EVENING.) 540 tablet 4   levETIRAcetam (KEPPRA) 750 MG tablet Take 1 tablet (750 mg total) by mouth 2 (two) times daily. 180 tablet 3   LORazepam (ATIVAN) 0.5 MG tablet Take 0.5 tablets (0.25 mg total) by mouth daily as needed for seizure. 5 tablet 0   Multiple Vitamin (MULTIVITAMIN) tablet Take 1 tablet by mouth daily.     No current facility-administered medications for this visit.    Allergies as of 01/23/2021 - Review Complete 01/23/2021  Allergen Reaction Noted   Sulfa antibiotics Hives 04/21/2011    Family History  Problem Relation Age of Onset   Colon polyps Mother    Asthma Maternal Grandmother    Diabetes Maternal Grandmother    Heart disease Maternal Grandfather    Lung cancer Paternal Grandmother    Colon cancer Maternal Aunt  Social History   Socioeconomic History   Marital status: Single    Spouse name: Not on file   Number of children: 0   Years of education: 12   Highest education level: Not on file  Occupational History   Not on file  Tobacco Use   Smoking status: Never   Smokeless tobacco: Never  Vaping Use   Vaping Use: Never used  Substance and Sexual Activity   Alcohol use: No   Drug use: No   Sexual activity: Never    Birth control/protection: None  Other Topics Concern   Not on file  Social History Narrative   Patient lives with mother  [name].   Patient has no children.    Patient as a high school education.   Patient is working.    Patient is single   Patient is left handed.   Patient does not drink caffeine.   Social Determinants of Health   Financial Resource Strain: Low Risk    Difficulty of Paying Living Expenses: Not hard at all  Food Insecurity: No Food Insecurity   Worried About Charity fundraiser in the Last Year: Never true   [place] in the Last Year: Never true  Transportation Needs: No Transportation Needs   Lack of Transportation (Medical): No   Lack of Transportation (Non-Medical): No  Physical Activity: Insufficiently Active   Days of Exercise per Week: 2 days   Minutes of Exercise per Session: 20 min  Stress: No Stress Concern Present   Feeling of Stress : Not at all  Social Connections: Moderately Integrated   Frequency of Communication with Friends and Family: More than three times a week   Frequency of Social Gatherings with Friends and Family: Twice a week   Attends Religious Services: More than 4 times per year   Active Member of [employer] or Organizations: Yes   Attends Music therapist: More than 4 times per year   Marital Status: Never married  Human resources officer Violence: Not At Risk   Fear of Current or Ex-Partner: No   Emotionally Abused: No   Physically Abused: No   Sexually Abused: No     Physical Exam: BP 120/78 (BP Location: Right Arm, Patient Position: Sitting, Cuff Size: Normal)    Pulse Vanessa    Ht 5' 3.5" (1.613 m)    Wt 170 lb 2 oz (77.2 kg)    LMP 01/08/2021    BMI 29.66 kg/m  Constitutional: generally well-appearing Psychiatric: alert and oriented x3 Abdomen: soft, nontender, nondistended, no obvious ascites, no peritoneal signs, normal bowel sounds No peripheral edema noted in lower extremities  Assessment and plan: 36 y.o. female with chronic elevated liver tests, alk phos and transaminases.  None of her current medicines really should explain  this.  Significant serologic and radiologic work-up has been essentially negative.  I discussed with her and her parents that we usually recommend liver biopsy in this scenario.  They understand there can be some risks with liver biopsy including injection site bleeding.  We will go ahead and arrange that through the radiology department, ultrasound-guided.  Please see the "Patient Instructions" section for addition details about the plan.  Owens Loffler, MD [facility] 01/23/2021, 3:14 PM   Total time on date of encounter was 25 minutes (this included time spent preparing to see the patient reviewing records; obtaining and/or reviewing separately obtained history; performing a medically appropriate exam and/or evaluation; counseling and educating the patient and family if  present; ordering medications, tests or procedures if applicable; and documenting clinical information in the health record).

## 2021-01-23 NOTE — Patient Instructions (Signed)
If you are age 37 or younger, your body mass index should be between 19-25. Your Body mass index is 29.66 kg/m. If this is out of the aformentioned range listed, please consider follow up with your Primary Care Provider.  ________________________________________________________  The [facility] providers would like to encourage you to use [facility] to communicate with providers for non-urgent requests or questions.  Due to long hold times on the telephone, sending your provider a message by [facility] may be a faster and more efficient way to get a response.  Please allow 48 business hours for a response.  Please remember that this is for non-urgent requests.  _______________________________________________________  Interventional Radiology will call you to schedule liver biopsy.  Thank you for entrusting me with your care and choosing [facility].  Dr Ardis Hughs

## 2021-02-04 ENCOUNTER — Other Ambulatory Visit: Payer: Self-pay | Admitting: Radiology

## 2021-02-04 ENCOUNTER — Other Ambulatory Visit (HOSPITAL_COMMUNITY): Payer: Self-pay | Admitting: Physician Assistant

## 2021-02-05 ENCOUNTER — Other Ambulatory Visit: Payer: Self-pay | Admitting: Radiology

## 2021-02-06 ENCOUNTER — Other Ambulatory Visit: Payer: Self-pay | Admitting: Radiology

## 2021-02-06 ENCOUNTER — Other Ambulatory Visit: Payer: Self-pay

## 2021-02-06 ENCOUNTER — Ambulatory Visit (HOSPITAL_COMMUNITY)
Admission: RE | Admit: 2021-02-06 | Discharge: 2021-02-06 | Disposition: A | Discharge status: Home / Self Care | Payer: Medicaid Other | Source: Ambulatory Visit | Attending: Gastroenterology | Admitting: Gastroenterology

## 2021-02-06 DIAGNOSIS — R945 Abnormal results of liver function studies: Secondary | ICD-10-CM | POA: Diagnosis present

## 2021-02-06 DIAGNOSIS — R569 Unspecified convulsions: Secondary | ICD-10-CM | POA: Diagnosis not present

## 2021-02-06 DIAGNOSIS — Z79899 Other long term (current) drug therapy: Secondary | ICD-10-CM | POA: Insufficient documentation

## 2021-02-06 DIAGNOSIS — R6250 Unspecified lack of expected normal physiological development in childhood: Secondary | ICD-10-CM | POA: Insufficient documentation

## 2021-02-06 LAB — CBC
HCT: 38.9 % (ref 36.0–46.0)
Hemoglobin: 12.4 g/dL (ref 12.0–15.0)
MCH: 27.3 pg (ref 26.0–34.0)
MCHC: 31.9 g/dL (ref 30.0–36.0)
MCV: 85.7 fL (ref 80.0–100.0)
Platelets: 419 10*3/uL — ABNORMAL HIGH (ref 150–400)
RBC: 4.54 MIL/uL (ref 3.87–5.11)
RDW: 12.8 % (ref 11.5–15.5)
WBC: 7 10*3/uL (ref 4.0–10.5)
nRBC: 0 % (ref 0.0–0.2)

## 2021-02-06 LAB — PROTIME-INR
INR: 0.9 (ref 0.8–1.2)
Prothrombin Time: 11.9 seconds (ref 11.4–15.2)

## 2021-02-06 MED ORDER — SODIUM CHLORIDE 0.9 % IV SOLN: INTRAVENOUS | Status: DC

## 2021-02-06 MED ORDER — MIDAZOLAM HCL 2 MG/2ML IJ SOLN
INTRAMUSCULAR | Status: AC
  Filled 2021-02-06: qty 2

## 2021-02-06 MED ORDER — FENTANYL CITRATE (PF) 100 MCG/2ML IJ SOLN
INTRAMUSCULAR | Status: AC | PRN
  Administered 2021-02-06: 50 ug via INTRAVENOUS

## 2021-02-06 MED ORDER — MIDAZOLAM HCL 2 MG/2ML IJ SOLN
INTRAMUSCULAR | Status: AC | PRN
  Administered 2021-02-06 (×2): 1 mg via INTRAVENOUS

## 2021-02-06 MED ORDER — FENTANYL CITRATE (PF) 100 MCG/2ML IJ SOLN
INTRAMUSCULAR | Status: AC
  Filled 2021-02-06: qty 2

## 2021-02-06 MED ORDER — LIDOCAINE HCL (PF) 1 % IJ SOLN
INTRAMUSCULAR | Status: AC
  Filled 2021-02-06: qty 30

## 2021-02-06 MED ORDER — GELATIN ABSORBABLE 12-7 MM EX MISC
CUTANEOUS | Status: AC
  Filled 2021-02-06: qty 1

## 2021-02-06 NOTE — Progress Notes (Signed)
2 attempts PIV to right AC, unable to access. IV team consulted.

## 2021-02-06 NOTE — H&P (Signed)
Chief Complaint: Patient was seen in consultation today for Elevated liver tests at the request of [place]  Referring Physician(s): Milus Banister  Supervising Physician: Mir, Sharen Heck  Patient Status: [facility] - Out-pt  History of Present Illness: Vanessa Zuniga is a 36 y.o. female with chronically elevated liver enzymes.  She has a developmental delay and history of seizures, requiring many anti-seizure medications over the years.  Workup has been negative in explaining elevated tests. She presents to IR today with her mother for a random liver biopsy with image guidance.  Patient reports feeling well and has no current complaints.  ROS negative.  She is NPO.  Has not taken morning meds.   Past Medical History:  Diagnosis Date   Developmental delay    mental age of 72, per mother   Elevated LFTs    Gait abnormality 04/07/2018   History of stroke    age 36   Seizures ([place])    last seizure 06/2014    Past Surgical History:  Procedure Laterality Date   BRAIN SURGERY     finger biopsy Right    MINOR EXCISION OF ORAL LESION N/A 05/01/2014   Procedure: EXCISION LIP MASS;  Surgeon: Leta Baptist, MD;  Location: [facility];  Service: ENT;  Laterality: N/A;    Allergies: Sulfa antibiotics  Medications: Prior to Admission medications   Medication Sig Start Date End Date Taking? Authorizing Provider  acetaminophen (TYLENOL) 500 MG tablet Take 1,000 mg by mouth every 6 (six) hours as needed for moderate pain or headache.   Yes [provider]  Ferrous Sulfate (IRON PO) Take 1 tablet by mouth daily.   Yes [provider]  lamoTRIgine (LAMICTAL) 100 MG tablet TAKE 2 TABLETS BY MOUTH IN THE MORNING AND 3 TABLETS IN THE EVENING. 10/04/20  Yes Kathrynn Ducking, MD  levETIRAcetam (KEPPRA) 750 MG tablet Take 1 tablet (750 mg total) by mouth 2 (two) times daily. 10/01/20  Yes Kathrynn Ducking, MD  Multiple Vitamin (MULTIVITAMIN) tablet Take 1 tablet by  mouth daily.   Yes [provider]  LORazepam (ATIVAN) 0.5 MG tablet Take 0.5 tablets (0.25 mg total) by mouth daily as needed for seizure. Patient not taking: Reported on 02/05/2021 12/26/19   Rolland [place], MD     Family History  Problem Relation Age of Onset   Colon polyps Mother    Asthma Maternal Grandmother    Diabetes Maternal Grandmother    Heart disease Maternal Grandfather    Lung cancer Paternal Grandmother    Colon cancer Maternal Aunt     Social History   Socioeconomic History   Marital status: Single    Spouse name: Not on file   Number of children: 0   Years of education: 12   Highest education level: Not on file  Occupational History   Not on file  Tobacco Use   Smoking status: Never   Smokeless tobacco: Never  Vaping Use   Vaping Use: Never used  Substance and Sexual Activity   Alcohol use: No   Drug use: No   Sexual activity: Never    Birth control/protection: None  Other Topics Concern   Not on file  Social History Narrative   Patient lives with mother [name].   Patient has no children.    Patient as a high school education.   Patient is working.    Patient is single   Patient is left handed.   Patient does not drink caffeine.  Social Determinants of Health   Financial Resource Strain: Low Risk    Difficulty of Paying Living Expenses: Not hard at all  Food Insecurity: No Food Insecurity   Worried About Charity fundraiser in the Last Year: Never true   Fellows in the Last Year: Never true  Transportation Needs: No Transportation Needs   Lack of Transportation (Medical): No   Lack of Transportation (Non-Medical): No  Physical Activity: Insufficiently Active   Days of Exercise per Week: 2 days   Minutes of Exercise per Session: 20 min  Stress: No Stress Concern Present   Feeling of Stress : Not at all  Social Connections: Moderately Integrated   Frequency of Communication with Friends and Family: More than three times a  week   Frequency of Social Gatherings with Friends and Family: Twice a week   Attends Religious Services: More than 4 times per year   Active Member of [employer] or Organizations: Yes   Attends Music therapist: More than 4 times per year   Marital Status: Never married    Vital Signs: BP 124/85 (BP Location: Right Arm)    Pulse 79    Temp 97.9 F (36.6 C) (Oral)    Ht 5' 3.5" (1.613 m)    Wt 170 lb (77.1 kg)    LMP 01/08/2021    SpO2 97%    BMI 29.64 kg/m   Physical Exam Constitutional:      General: She is not in acute distress.    Appearance: Normal appearance. She is not ill-appearing.  HENT:     Head: Normocephalic.     Mouth/Throat:     Mouth: Mucous membranes are moist.     Pharynx: Oropharynx is clear.  Eyes:     Pupils: Pupils are equal, round, and reactive to light.  Cardiovascular:     Rate and Rhythm: Normal rate.     Pulses: Normal pulses.  Pulmonary:     Effort: Pulmonary effort is normal.  Abdominal:     Palpations: Abdomen is soft.  Skin:    General: Skin is warm and dry.     Capillary Refill: Capillary refill takes less than 2 seconds.  Neurological:     Mental Status: She is alert and oriented to person, place, and time. Mental status is at baseline.  Psychiatric:        Mood and Affect: Mood normal.        Behavior: Behavior normal.    Imaging: No results found.  Labs:  CBC: Recent Labs    10/01/20 0802 11/14/20 1639  WBC 6.4 6.8  HGB 11.7 12.6  HCT 35.8 38.3  PLT 376 407.0*    COAGS: Recent Labs    11/14/20 1639  INR 1.0    BMP: Recent Labs    04/09/20 1558 10/01/20 0802 11/14/20 1639  NA 139 138 139  K 3.9 5.1 3.6  CL 100 101 100  CO2 24 25 30   GLUCOSE 93 87 80  BUN 9 9 11   CALCIUM 10.1 9.8 10.3  CREATININE 0.88 0.87 0.92    LIVER FUNCTION TESTS: Recent Labs    10/01/20 0802 11/14/20 1639 12/10/20 1521 01/22/21 1535  BILITOT 0.2 0.3   0.3 0.2 0.3  AST 42* 42*   42* 42* 28  ALT 46* 55*   55* 45* 35   ALKPHOS 361* 351*   351* 310* 346*  PROT 7.1 8.3   8.3 7.8 7.6  ALBUMIN 4.5 4.8  4.8 4.5 4.3    TUMOR MARKERS:   Assessment and Plan:  This is a pleasant 36 yo female who presents today for elevated liver enzymes and is ready to proceed with planned random liver biopsy.  Vitals, history, and most recent labs reviewed.  This will be performed with moderate sedation and plan for discharge later this morning.  Risks and benefits of liver biopsy was discussed with the patient and/or patient's family including, but not limited to bleeding, infection, damage to adjacent structures or low yield requiring additional tests.  All of the questions were answered and there is agreement to proceed.  Consent signed and in chart.   Thank you for this interesting consult.  I greatly enjoyed meeting Vanessa Zuniga and look forward to participating in their care.  A copy of this report was sent to the requesting provider on this date.  Electronically Signed: Pasty Spillers, PA 02/06/2021, 7:22 AM   I spent a total of 15 Minutes in face to face in clinical consultation, greater than 50% of which was counseling/coordinating care for liver biopsy

## 2021-02-06 NOTE — Progress Notes (Signed)
IVT consulted for difficult PIV placement.  Upon arrival, a PIV had been placed by [employer].

## 2021-02-06 NOTE — Progress Notes (Signed)
IVT consulted for difficult PIV placement.  Upon arrival, pt was being transported out of room. Advised IR will place PIV.

## 2021-02-06 NOTE — Progress Notes (Signed)
Pt ambulated without difficulty or bleeding.   Discharged home with mom who will drive and stay with pt x 24 hrs

## 2021-02-06 NOTE — Procedures (Signed)
Interventional Radiology Procedure Note  Procedure: US guided random liver biopsy  Indication: Elevated liver enzymes  Findings: Please refer to procedural dictation for full description.  Complications: None  EBL: < 10 mL  Bodey Frizell, MD [phone]   

## 2021-02-08 LAB — SURGICAL PATHOLOGY

## 2021-02-22 ENCOUNTER — Other Ambulatory Visit: Payer: Self-pay

## 2021-02-22 DIAGNOSIS — R945 Abnormal results of liver function studies: Secondary | ICD-10-CM

## 2021-04-09 ENCOUNTER — Ambulatory Visit: Payer: Medicaid Other | Admitting: Neurology

## 2021-04-22 ENCOUNTER — Telehealth: Payer: Self-pay

## 2021-04-22 NOTE — Telephone Encounter (Signed)
Left message for patient to call back in regards to follow up lab work.  ?

## 2021-04-23 ENCOUNTER — Other Ambulatory Visit (INDEPENDENT_AMBULATORY_CARE_PROVIDER_SITE_OTHER): Payer: Medicaid Other

## 2021-04-23 DIAGNOSIS — R945 Abnormal results of liver function studies: Secondary | ICD-10-CM | POA: Diagnosis not present

## 2021-04-23 LAB — HEPATIC FUNCTION PANEL
ALT: 69 U/L — ABNORMAL HIGH (ref 0–35)
AST: 47 U/L — ABNORMAL HIGH (ref 0–37)
Albumin: 4.4 g/dL (ref 3.5–5.2)
Alkaline Phosphatase: 354 U/L — ABNORMAL HIGH (ref 39–117)
Bilirubin, Direct: 0.1 mg/dL (ref 0.0–0.3)
Total Bilirubin: 0.3 mg/dL (ref 0.2–1.2)
Total Protein: 7.9 g/dL (ref 6.0–8.3)

## 2021-04-23 NOTE — Telephone Encounter (Signed)
Patient reminded to come in for labs through [employer]. Last read by [name] at  7:36 AM on 04/23/2021. ?

## 2021-05-07 ENCOUNTER — Ambulatory Visit: Payer: Medicaid Other | Admitting: Neurology

## 2021-05-07 ENCOUNTER — Encounter: Payer: Self-pay | Admitting: Neurology

## 2021-05-07 ENCOUNTER — Telehealth: Payer: Self-pay | Admitting: Neurology

## 2021-05-07 VITALS — BP 123/84 | HR 87 | Ht 63.5 in | Wt 169.5 lb

## 2021-05-07 DIAGNOSIS — Z87898 Personal history of other specified conditions: Secondary | ICD-10-CM

## 2021-05-07 DIAGNOSIS — Z8673 Personal history of transient ischemic attack (TIA), and cerebral infarction without residual deficits: Secondary | ICD-10-CM | POA: Diagnosis not present

## 2021-05-07 DIAGNOSIS — G40209 Localization-related (focal) (partial) symptomatic epilepsy and epileptic syndromes with complex partial seizures, not intractable, without status epilepticus: Secondary | ICD-10-CM

## 2021-05-07 MED ORDER — LEVETIRACETAM 750 MG PO TABS: 1500.0000 mg | ORAL_TABLET | Freq: Two times a day (BID) | ORAL | 3 refills | Status: DC

## 2021-05-07 NOTE — Progress Notes (Signed)
? ? ?Patient: Vanessa Zuniga ?Date of Birth: Sep 03, 1985 ? ?Reason for Visit: Follow up for seizures ?History from: Patient, mother ?Primary Neurologist: Dr. Jannifer Franklin Dr. Krista Blue  ? ?ASSESSMENT AND PLAN ?36 y.o. year old female  ? ?1.  Cerebrovascular disease, right hemiparesis ?2.  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  Elevated alkaline phosphatase, AST, ALT ? ?-Has been evaluated by GI, unable to identify clear cause of elevated liver enzymes, felt could be related to Lamictal ?-Will go ahead and wean off Lamictal slowly, is currently taking 200 mg AM, 300 mg PM; will reduce by 100 mg weekly until off ?-Increase Keppra 1500 mg twice daily ?-Check EEG ?-Call for any seizure activity, reviewed plan with Dr. Krista Blue, will send over to Dr. Ardis Hughs at GI to be aware of plan  ? ?HISTORY OF PRESENT ILLNESS: ?Today 05/07/21 ?Vanessa Zuniga here today for follow-up. Seeing GI, had liver biopsy, was unremarkable. Had LFT 04/23/21 Alk phos 354, AST 47, ALT 69. Here today to discuss medications, on Keppra 750 mg twice daily, Lamictal 200/300 mg daily. No seizures since Dec 2021. Liver biopsy was unremarkable. GI suggesting come off Lamictal? She feels fine, denies any problem or concerns. In past couldn't tolerate Vimpat. Was on Carbamazepine and Lamictal long term for seizures, d/c carbamazepine 2021 due to elevated alk phos long term. ? ?Trend recap: ?-September 2021 alkaline phosphatase 320, AST 27, ALT 39, weaned off carbamazepine, continue Lamictal 300 mg twice daily, Vimpat was added ?-Seizure December 2021 on Vimpat 100 mg AM/150 MG PM and Lamictal 300 mg twice daily, alkaline phosphatase was 253, ALT 48, AST 35, had side effect of Vimpat, Keppra was added 750 mg twice daily ?-I saw her in March 2022, alkaline phosphatase was 305, AST 43, ALT 55 on Keppra 750 mg twice daily, Lamictal 300 mg twice daily, Lamictal level was 21.4 ?-Saw Dr. Jannifer Franklin in September 2022, alkaline phosphatase was 361, AST 42, ALT 46, on Keppra 750 mg twice  daily, Lamictal 300 mg twice daily, was referred to GI, Lamictal level was reduced 200/300 mg daily since Lamictal level was 29.4 ? ?HISTORY  ?10/01/2020 Dr. Jannifer Franklin: Vanessa Zuniga is a 36 year old left-handed black female with a history of cerebrovascular disease with a prior stroke affecting the left brain with a right hemiparesis.  The patient has seizures associated with this.  She had been on carbamazepine in the past but has had elevation in the alkaline phosphatase enzyme, she has come off of carbamazepine but it appears that her enzyme levels are still elevated.  She last had a seizure on 26 December 2019 as she was coming down off of the Vimpat that she did not tolerate.  She currently is on a combination of Lamictal and Keppra with good tolerance.  She has not had any further seizure spells.  She lives with her mother, and she does not maintain gainful employment.  She does not operate a motor vehicle.  She comes here for further evaluation. ? ?REVIEW OF SYSTEMS: Out of a complete 14 system review of symptoms, the patient complains only of the following symptoms, and all other reviewed systems are negative. ? ?See HPI ? ?ALLERGIES: ?Allergies  ?Allergen Reactions  ? Sulfa Antibiotics Hives  ? ? ?HOME MEDICATIONS: ?Outpatient Medications Prior to Visit  ?Medication Sig Dispense Refill  ? acetaminophen (TYLENOL) 500 MG tablet Take 1,000 mg by mouth every 6 (six) hours as needed for moderate pain or headache.    ? Ferrous Sulfate (IRON PO) Take 1  tablet by mouth daily.    ? lamoTRIgine (LAMICTAL) 100 MG tablet TAKE 2 TABLETS BY MOUTH IN THE MORNING AND 3 TABLETS IN THE EVENING. 540 tablet 4  ? levETIRAcetam (KEPPRA) 750 MG tablet Take 1 tablet (750 mg total) by mouth 2 (two) times daily. 180 tablet 3  ? Multiple Vitamin (MULTIVITAMIN) tablet Take 1 tablet by mouth daily.    ? LORazepam (ATIVAN) 0.5 MG tablet Take 0.5 tablets (0.25 mg total) by mouth daily as needed for seizure. (Patient not taking: Reported on  05/07/2021) 5 tablet 0  ? ?No facility-administered medications prior to visit.  ? ? ?PAST MEDICAL HISTORY: ?Past Medical History:  ?Diagnosis Date  ? Developmental delay   ? mental age of 27, per mother  ? Elevated LFTs   ? Gait abnormality 04/07/2018  ? History of stroke   ? age 67  ? Seizures ([place])   ? last seizure 06/2014  ? ? ?PAST SURGICAL HISTORY: ?Past Surgical History:  ?Procedure Laterality Date  ? BRAIN SURGERY    ? finger biopsy Right   ? MINOR EXCISION OF ORAL LESION N/A 05/01/2014  ? Procedure: EXCISION LIP MASS;  Surgeon: Leta Baptist, MD;  Location: [facility];  Service: ENT;  Laterality: N/A;  ? ? ?FAMILY HISTORY: ?Family History  ?Problem Relation Age of Onset  ? Colon polyps Mother   ? Asthma Maternal Grandmother   ? Diabetes Maternal Grandmother   ? Heart disease Maternal Grandfather   ? Lung cancer Paternal Grandmother   ? Colon cancer Maternal Aunt   ? ? ?SOCIAL HISTORY: ?Social History  ? ?Socioeconomic History  ? Marital status: Single  ?  Spouse name: Not on file  ? Number of children: 0  ? Years of education: 13  ? Highest education level: Not on file  ?Occupational History  ? Not on file  ?Tobacco Use  ? Smoking status: Never  ? Smokeless tobacco: Never  ?Vaping Use  ? Vaping Use: Never used  ?Substance and Sexual Activity  ? Alcohol use: No  ? Drug use: No  ? Sexual activity: Never  ?  Birth control/protection: None  ?Other Topics Concern  ? Not on file  ?Social History Narrative  ? Patient lives with mother Vanessa Zuniga.  ? Patient has no children.   ? Patient as a high school education.  ? Patient is working.   ? Patient is single  ? Patient is left handed.  ? Patient does not drink caffeine.  ? ?Social Determinants of Health  ? ?Financial Resource Strain: Low Risk   ? Difficulty of Paying Living Expenses: Not hard at all  ?Food Insecurity: No Food Insecurity  ? Worried About Charity fundraiser in the Last Year: Never true  ? Ran Out of Food in the Last Year: Never true   ?Transportation Needs: No Transportation Needs  ? Lack of Transportation (Medical): No  ? Lack of Transportation (Non-Medical): No  ?Physical Activity: Insufficiently Active  ? Days of Exercise per Week: 2 days  ? Minutes of Exercise per Session: 20 min  ?Stress: No Stress Concern Present  ? Feeling of Stress : Not at all  ?Social Connections: Moderately Integrated  ? Frequency of Communication with Friends and Family: More than three times a week  ? Frequency of Social Gatherings with Friends and Family: Twice a week  ? Attends Religious Services: More than 4 times per year  ? Active Member of Clubs or Organizations: Yes  ? Attends Club  or Organization Meetings: More than 4 times per year  ? Marital Status: Never married  ?Intimate Partner Violence: Not At Risk  ? Fear of Current or Ex-Partner: No  ? Emotionally Abused: No  ? Physically Abused: No  ? Sexually Abused: No  ? ? ?PHYSICAL EXAM ? ?Vitals:  ? 05/07/21 1444  ?BP: 123/84  ?Pulse: 87  ?Weight: 169 lb 8 oz (76.9 kg)  ?Height: 5' 3.5" (1.613 m)  ? ?Body mass index is 29.55 kg/m?. ? ?Generalized: Well developed, in no acute distress  ?Neurological examination  ?Mentation: Alert oriented to time, place, history is mostly provided by her mother. Follows all commands speech and language fluent ?Cranial nerve II-XII: Pupils were equal round reactive to light. Extraocular movements were full, visual field were full on confrontational test. Facial sensation and strength were normal. Head turning and shoulder shrug  were normal and symmetric. ?Motor: Good strength all extremities, slight weakness to right upper extremity, flexion of the right fingers/arm ?Sensory: Sensory testing is intact to soft touch on all 4 extremities. No evidence of extinction is noted.  ?Coordination: Cerebellar testing reveals good finger-nose-finger and heel-to-shin bilaterally.  ?Gait and station: Gait is slightly wide-based, slight limp on the right ?Reflexes: Deep tendon reflexes are  symmetric but slightly increased on the right ? ?DIAGNOSTIC DATA (LABS, IMAGING, TESTING) ?- I reviewed patient records, labs, notes, testing and imaging myself where available. ? ?Lab Results  ?Component Value Date  ? WBC

## 2021-05-07 NOTE — Patient Instructions (Addendum)
Start Keppra 1500 mg twice daily, decrease the lamictal by 100 mg weekly (200 mg twice daily x 1 week, then 100 mg AM/200 mg x 1 week, then 100 mg twice daily x 1 week, then 100 mg daily x 1 week, then stop) ? ?Check EEG  ? ?See you back in 4 months ? ?Call for any seizures  ? ? ?

## 2021-05-07 NOTE — Telephone Encounter (Signed)
Pt mother called, requesting earlier appt.  Pt mother states pt Gastro Dr. who pt was referred to by Dr. Jannifer Franklin is the office which handles pt liver count.  ?Last week they informed pt mother that they cant move along with treatment until pt is seen by [facility] office.  ?Pt mother states levETIRAcetam (KEPPRA) 750 MG tablet Medication may be effecting liver count.  ?Moved pt appointment up to open appt today 05/07/2021 at 2:45.  ?

## 2021-05-08 NOTE — Progress Notes (Signed)
Chart reviewed, agree above plan ?

## 2021-05-09 ENCOUNTER — Other Ambulatory Visit: Payer: Medicaid Other | Admitting: *Deleted

## 2021-05-14 ENCOUNTER — Ambulatory Visit: Payer: Medicaid Other | Admitting: Neurology

## 2021-05-14 DIAGNOSIS — G40209 Localization-related (focal) (partial) symptomatic epilepsy and epileptic syndromes with complex partial seizures, not intractable, without status epilepticus: Secondary | ICD-10-CM | POA: Diagnosis not present

## 2021-06-03 NOTE — Procedures (Signed)
   HISTORY: History of left basal ganglion stroke, seizure  TECHNIQUE:  This is a routine 16 channel EEG recording with one channel devoted to a limited EKG recording.  It was performed during wakefulness, drowsiness and asleep.  Hyperventilation and photic stimulation were performed as activating procedures.  There are frequent bilateral frontal muscle artifact.  Upon maximum arousal, posterior dominant waking rhythm consistent of rhythmic alpha range activity. Activities are symmetric over the bilateral posterior derivations and attenuated with eye opening.  Hyperventilation produced mild/moderate buildup with higher amplitude and the slower activities noted.  Photic stimulation did not alter the tracing.  During EEG recording, patient developed drowsiness and no deeper stage of sleep was achieved During EEG recording, there was no epileptiform discharge noted.  EKG demonstrate sinus rhythm, with heart rate of 72 bpm  CONCLUSION: This is a  normal awake EEG.  There is no electrodiagnostic evidence of epileptiform discharge.  Marcial Pacas, M.D. Ph.D.  [facility] [place], [place] [address] Phone: [phone] Fax:      [phone]

## 2021-06-28 ENCOUNTER — Telehealth: Payer: Self-pay | Admitting: Neurology

## 2021-06-28 ENCOUNTER — Other Ambulatory Visit: Payer: Self-pay | Admitting: Neurology

## 2021-06-28 MED ORDER — LEVETIRACETAM 1000 MG PO TABS: 2000.0000 mg | ORAL_TABLET | Freq: Two times a day (BID) | ORAL | 6 refills | Status: DC

## 2021-06-28 NOTE — Telephone Encounter (Signed)
Pt's mother(on DPR) has called to report recent major seizure pt had in which mother called 911 to home for pt.  Pt's mother states they are out of town and her concern is if she should continue giving pt current dose of Keppra or if it would be suggested to change dose.  Pt's mother has asked that the On Call Dr call her to advise.

## 2021-07-01 ENCOUNTER — Telehealth: Payer: Self-pay | Admitting: Neurology

## 2021-07-01 MED ORDER — LEVETIRACETAM 1000 MG PO TABS: 2000.0000 mg | ORAL_TABLET | Freq: Two times a day (BID) | ORAL | 11 refills | Status: DC

## 2021-07-01 NOTE — Telephone Encounter (Signed)
Pt's mother, Jone Baseman request refill for levETIRAcetam (KEPPRA) 1000 MG tablet at [facility]. Last prescription pharmacy only gave 12 tablets because would not accept insurance.

## 2021-07-01 NOTE — Telephone Encounter (Signed)
I spoke to the patient's mother. She was thankful the medication was sent to the local pharmacy. She moved her appt to an earlier date. The patient will be coming in on 07/04/21.

## 2021-07-01 NOTE — Telephone Encounter (Signed)
Please call and check on patient,   I have called in to her pharmacy at [facility] ordered this encounter  Medications   levETIRAcetam (KEPPRA) 1000 MG tablet    Sig: Take 2 tablets (2,000 mg total) by mouth 2 (two) times daily.    Dispense:  120 tablet    Refill:  11     If she wants to, may move up her follow-up appointment with either Judson Roch or me

## 2021-07-01 NOTE — Telephone Encounter (Signed)
Rx sent 

## 2021-07-03 NOTE — Progress Notes (Unsigned)
Patient: Vanessa Zuniga Date of Birth: 11-28-1985  Reason for Visit: Follow up for seizures History from: Patient, mother Primary Neurologist: Dr. Jannifer Franklin Dr. Krista Blue   ASSESSMENT AND PLAN 36 y.o. year old female   1.  Cerebrovascular disease, right hemiparesis 2.  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  Elevated alkaline phosphatase, AST, ALT  -Has been evaluated by GI, unable to identify clear cause of elevated liver enzymes, felt could be related to Lamictal -Will go ahead and wean off Lamictal slowly, is currently taking 200 mg AM, 300 mg PM; will reduce by 100 mg weekly until off -Increase Keppra 1500 mg twice daily -Check EEG -Call for any seizure activity, reviewed plan with Dr. Krista Blue, will send over to Dr. Ardis Hughs at GI to be aware of plan   HISTORY OF PRESENT ILLNESS: Update 05/07/21 [name] here today for follow-up. Seeing GI, had liver biopsy, was unremarkable. Had LFT 04/23/21 Alk phos 354, AST 47, ALT 69. Here today to discuss medications, on Keppra 750 mg twice daily, Lamictal 200/300 mg daily. No seizures since Dec 2021. Liver biopsy was unremarkable. GI suggesting come off Lamictal? She feels fine, denies any problem or concerns. In past couldn't tolerate Vimpat. Was on Carbamazepine and Lamictal long term for seizures, d/c carbamazepine 2021 due to elevated alk phos long term.  Trend recap: -September 2021 alkaline phosphatase 320, AST 27, ALT 39, weaned off carbamazepine, continue Lamictal 300 mg twice daily, Vimpat was added -Seizure December 2021 on Vimpat 100 mg AM/150 MG PM and Lamictal 300 mg twice daily, alkaline phosphatase was 253, ALT 48, AST 35, had side effect of Vimpat, Keppra was added 750 mg twice daily -I saw her in March 2022, alkaline phosphatase was 305, AST 43, ALT 55 on Keppra 750 mg twice daily, Lamictal 300 mg twice daily, Lamictal level was 21.4 -Saw Dr. Jannifer Franklin in September 2022, alkaline phosphatase was 361, AST 42, ALT 46, on Keppra 750 mg twice  daily, Lamictal 300 mg twice daily, was referred to GI, Lamictal level was reduced 200/300 mg daily since Lamictal level was 29.4  July 04, 2021 SS:   REVIEW OF SYSTEMS: Out of a complete 14 system review of symptoms, the patient complains only of the following symptoms, and all other reviewed systems are negative.  See HPI  ALLERGIES: Allergies  Allergen Reactions   Sulfa Antibiotics Hives    HOME MEDICATIONS: Outpatient Medications Prior to Visit  Medication Sig Dispense Refill   acetaminophen (TYLENOL) 500 MG tablet Take 1,000 mg by mouth every 6 (six) hours as needed for moderate pain or headache.     Ferrous Sulfate (IRON PO) Take 1 tablet by mouth daily.     lamoTRIgine (LAMICTAL) 100 MG tablet TAKE 2 TABLETS BY MOUTH IN THE MORNING AND 3 TABLETS IN THE EVENING. 540 tablet 4   levETIRAcetam (KEPPRA) 1000 MG tablet Take 2 tablets (2,000 mg total) by mouth 2 (two) times daily. 120 tablet 11   LORazepam (ATIVAN) 0.5 MG tablet Take 0.5 tablets (0.25 mg total) by mouth daily as needed for seizure. (Patient not taking: Reported on 05/07/2021) 5 tablet 0   Multiple Vitamin (MULTIVITAMIN) tablet Take 1 tablet by mouth daily.     No facility-administered medications prior to visit.    PAST MEDICAL HISTORY: Past Medical History:  Diagnosis Date   Developmental delay    mental age of 64, per mother   Elevated LFTs    Gait abnormality 04/07/2018   History of  stroke    age 85   Seizures ([place])    last seizure 06/2014    PAST SURGICAL HISTORY: Past Surgical History:  Procedure Laterality Date   BRAIN SURGERY     finger biopsy Right    MINOR EXCISION OF ORAL LESION N/A 05/01/2014   Procedure: EXCISION LIP MASS;  Surgeon: Leta Baptist, MD;  Location: [facility];  Service: ENT;  Laterality: N/A;    FAMILY HISTORY: Family History  Problem Relation Age of Onset   Colon polyps Mother    Asthma Maternal Grandmother    Diabetes Maternal Grandmother    Heart disease  Maternal Grandfather    Lung cancer Paternal Grandmother    Colon cancer Maternal Aunt     SOCIAL HISTORY: Social History   Socioeconomic History   Marital status: Single    Spouse name: Not on file   Number of children: 0   Years of education: 12   Highest education level: Not on file  Occupational History   Not on file  Tobacco Use   Smoking status: Never   Smokeless tobacco: Never  Vaping Use   Vaping Use: Never used  Substance and Sexual Activity   Alcohol use: No   Drug use: No   Sexual activity: Never    Birth control/protection: None  Other Topics Concern   Not on file  Social History Narrative   Patient lives with mother [name].   Patient has no children.    Patient as a high school education.   Patient is working.    Patient is single   Patient is left handed.   Patient does not drink caffeine.   Social Determinants of Health   Financial Resource Strain: Low Risk  (05/17/2020)   Overall Financial Resource Strain (CARDIA)    Difficulty of Paying Living Expenses: Not hard at all  Food Insecurity: No Food Insecurity (05/17/2020)   Hunger Vital Sign    Worried About Running Out of Food in the Last Year: Never true    Ran Out of Food in the Last Year: Never true  Transportation Needs: No Transportation Needs (05/17/2020)   PRAPARE - Hydrologist (Medical): No    Lack of Transportation (Non-Medical): No  Physical Activity: Insufficiently Active (05/17/2020)   Exercise Vital Sign    Days of Exercise per Week: 2 days    Minutes of Exercise per Session: 20 min  Stress: No Stress Concern Present (05/17/2020)   Prot[place]in    Feeling of Stress : Not at all  Social Connections: Moderately Integrated (05/17/2020)   Social Connection and Isolation Panel [NHANES]    Frequency of Communication with Friends and Family: More than three times a week    Frequency of Social Gatherings  with Friends and Family: Twice a week    Attends Religious Services: More than 4 times per year    Active Member of [employer] or Organizations: Yes    Attends Archivist Meetings: More than 4 times per year    Marital Status: Never married  Intimate Partner Violence: Not At Risk (05/17/2020)   Humiliation, Afraid, Rape, and Kick questionnaire    Fear of Current or Ex-Partner: No    Emotionally Abused: No    Physically Abused: No    Sexually Abused: No    PHYSICAL EXAM  There were no vitals filed for this visit.  There is no height or weight on file  to calculate BMI.  Generalized: Well developed, in no acute distress  Neurological examination  Mentation: Alert oriented to time, place, history is mostly provided by her mother. Follows all commands speech and language fluent Cranial nerve II-XII: Pupils were equal round reactive to light. Extraocular movements were full, visual field were full on confrontational test. Facial sensation and strength were normal. Head turning and shoulder shrug  were normal and symmetric. Motor: Good strength all extremities, slight weakness to right upper extremity, flexion of the right fingers/arm Sensory: Sensory testing is intact to soft touch on all 4 extremities. No evidence of extinction is noted.  Coordination: Cerebellar testing reveals good finger-nose-finger and heel-to-shin bilaterally.  Gait and station: Gait is slightly wide-based, slight limp on the right Reflexes: Deep tendon reflexes are symmetric but slightly increased on the right  DIAGNOSTIC DATA (LABS, IMAGING, TESTING) - I reviewed patient records, labs, notes, testing and imaging myself where available.  Lab Results  Component Value Date   WBC 7.0 02/06/2021   HGB 12.4 02/06/2021   HCT 38.9 02/06/2021   MCV 85.7 02/06/2021   PLT 419 (H) 02/06/2021      Component Value Date/Time   NA 139 11/14/2020 1639   NA 138 10/01/2020 0802   K 3.6 11/14/2020 1639   CL 100  11/14/2020 1639   CO2 30 11/14/2020 1639   GLUCOSE 80 11/14/2020 1639   BUN 11 11/14/2020 1639   BUN 9 10/01/2020 0802   CREATININE 0.92 11/14/2020 1639   CALCIUM 10.3 11/14/2020 1639   PROT 7.9 04/23/2021 1029   PROT 7.1 10/01/2020 0802   ALBUMIN 4.4 04/23/2021 1029   ALBUMIN 4.5 10/01/2020 0802   AST 47 (H) 04/23/2021 1029   ALT 69 (H) 04/23/2021 1029   ALKPHOS 354 (H) 04/23/2021 1029   BILITOT 0.3 04/23/2021 1029   BILITOT 0.2 10/01/2020 0802   GFRNONAA >60 12/26/2019 0200   GFRAA 116 10/11/2019 1516   No results found for: "CHOL", "HDL", "LDLCALC", "LDLDIRECT", "TRIG", "CHOLHDL" No results found for: "HGBA1C" No results found for: "VITAMINB12" No results found for: "TSH"  Butler Denmark, Laqueta Jean, DNP 07/03/2021, 4:21 PM [facility] [address] [place], [place] [address] [phone]

## 2021-07-04 ENCOUNTER — Ambulatory Visit: Payer: Medicaid Other | Admitting: Neurology

## 2021-07-04 ENCOUNTER — Encounter: Payer: Self-pay | Admitting: Neurology

## 2021-07-04 VITALS — BP 115/75 | HR 68 | Ht 63.5 in | Wt 170.0 lb

## 2021-07-04 DIAGNOSIS — Z8673 Personal history of transient ischemic attack (TIA), and cerebral infarction without residual deficits: Secondary | ICD-10-CM

## 2021-07-04 DIAGNOSIS — G40209 Localization-related (focal) (partial) symptomatic epilepsy and epileptic syndromes with complex partial seizures, not intractable, without status epilepticus: Secondary | ICD-10-CM | POA: Diagnosis not present

## 2021-07-04 MED ORDER — CLOBAZAM 10 MG PO TABS
10.0000 mg | ORAL_TABLET | Freq: Every day | ORAL | 5 refills | Status: DC
Start: 2021-07-04 — End: 2021-12-30

## 2021-07-04 NOTE — Patient Instructions (Signed)
Continue Keppra 2000 mg twice daily, add on Onfi 10 mg at bedtime Check labs today  Call for any seizure type events See you back in 6 months

## 2021-07-05 LAB — COMPREHENSIVE METABOLIC PANEL
ALT: 74 IU/L — ABNORMAL HIGH (ref 0–32)
AST: 63 IU/L — ABNORMAL HIGH (ref 0–40)
Albumin/Globulin Ratio: 1.5 (ref 1.2–2.2)
Albumin: 4.1 g/dL (ref 3.8–4.8)
Alkaline Phosphatase: 314 IU/L — ABNORMAL HIGH (ref 44–121)
BUN/Creatinine Ratio: 11 (ref 9–23)
BUN: 8 mg/dL (ref 6–20)
Bilirubin Total: 0.2 mg/dL (ref 0.0–1.2)
CO2: 24 mmol/L (ref 20–29)
Calcium: 9.7 mg/dL (ref 8.7–10.2)
Chloride: 100 mmol/L (ref 96–106)
Creatinine, Ser: 0.71 mg/dL (ref 0.57–1.00)
Globulin, Total: 2.8 g/dL (ref 1.5–4.5)
Glucose: 96 mg/dL (ref 70–99)
Potassium: 4.3 mmol/L (ref 3.5–5.2)
Sodium: 138 mmol/L (ref 134–144)
Total Protein: 6.9 g/dL (ref 6.0–8.5)
eGFR: 113 mL/min/{1.73_m2} (ref >59)

## 2021-07-05 LAB — CBC WITH DIFFERENTIAL/PLATELET
Basophils Absolute: 0.1 10*3/uL (ref 0.0–0.2)
Basos: 1 %
EOS (ABSOLUTE): 0.2 10*3/uL (ref 0.0–0.4)
Eos: 2 %
Hematocrit: 36.3 % (ref 34.0–46.6)
Hemoglobin: 11.9 g/dL (ref 11.1–15.9)
Immature Grans (Abs): 0 10*3/uL (ref 0.0–0.1)
Immature Granulocytes: 0 %
Lymphocytes Absolute: 2 10*3/uL (ref 0.7–3.1)
Lymphs: 24 %
MCH: 26.8 pg (ref 26.6–33.0)
MCHC: 32.8 g/dL (ref 31.5–35.7)
MCV: 82 fL (ref 79–97)
Monocytes Absolute: 0.6 10*3/uL (ref 0.1–0.9)
Monocytes: 7 %
Neutrophils Absolute: 5.4 10*3/uL (ref 1.4–7.0)
Neutrophils: 66 %
Platelets: 359 10*3/uL (ref 150–450)
RBC: 4.44 x10E6/uL (ref 3.77–5.28)
RDW: 12.2 % (ref 11.7–15.4)
WBC: 8.3 10*3/uL (ref 3.4–10.8)

## 2021-07-05 LAB — LEVETIRACETAM LEVEL: Levetiracetam Lvl: 70.6 ug/mL — ABNORMAL HIGH (ref 10.0–40.0)

## 2021-07-08 ENCOUNTER — Telehealth: Payer: Self-pay | Admitting: *Deleted

## 2021-07-10 NOTE — Telephone Encounter (Signed)
Please call the patient's mother.  I was able to discuss with Dr. Ardis Hughs, requested I repeat LFTs in 2 months, depending on results he may consider formal hepatology evaluation. Have her continue current medications. I sent myself a reminder.

## 2021-07-10 NOTE — Telephone Encounter (Signed)
Left message, for a return call, on her mother's voicemail.

## 2021-07-10 NOTE — Telephone Encounter (Signed)
I spoke to the patient's mother. She is aware of results and the next plan. She is agreeable to repeat labs in two months.

## 2021-09-02 ENCOUNTER — Telehealth: Payer: Self-pay | Admitting: Neurology

## 2021-09-02 DIAGNOSIS — R7989 Other specified abnormal findings of blood chemistry: Secondary | ICD-10-CM

## 2021-09-02 NOTE — Telephone Encounter (Signed)
Please call the patient, ask her to come in for blood work, will check liver function panel. Thanks.   Orders Placed This Encounter  Procedures   Hepatic Function Panel    Standing Status:   Future    Standing Expiration Date:   09/03/2022

## 2021-09-03 ENCOUNTER — Other Ambulatory Visit (INDEPENDENT_AMBULATORY_CARE_PROVIDER_SITE_OTHER): Payer: Self-pay

## 2021-09-03 DIAGNOSIS — R7989 Other specified abnormal findings of blood chemistry: Secondary | ICD-10-CM

## 2021-09-03 DIAGNOSIS — Z0289 Encounter for other administrative examinations: Secondary | ICD-10-CM

## 2021-09-03 NOTE — Telephone Encounter (Signed)
I spoke with the patient's mother, [name] (as per DPR). I asked that the patient come in for blood work. She was agreeable to the patient coming in this week. She expressed appreciation for the call.

## 2021-09-04 LAB — HEPATIC FUNCTION PANEL
ALT: 45 IU/L — ABNORMAL HIGH (ref 0–32)
AST: 42 IU/L — ABNORMAL HIGH (ref 0–40)
Albumin: 4.3 g/dL (ref 3.9–4.9)
Alkaline Phosphatase: 286 IU/L — ABNORMAL HIGH (ref 44–121)
Bilirubin Total: <0.2 mg/dL (ref 0.0–1.2)
Bilirubin, Direct: <0.1 mg/dL (ref 0.00–0.40)
Total Protein: 7.1 g/dL (ref 6.0–8.5)

## 2021-09-06 ENCOUNTER — Other Ambulatory Visit: Payer: Self-pay

## 2021-09-06 DIAGNOSIS — R945 Abnormal results of liver function studies: Secondary | ICD-10-CM

## 2021-09-11 ENCOUNTER — Ambulatory Visit: Payer: Medicaid Other | Admitting: Neurology

## 2021-09-17 ENCOUNTER — Telehealth: Payer: Self-pay | Admitting: Neurology

## 2021-09-17 NOTE — Telephone Encounter (Signed)
I called the patient's mother. GI contacted her, will be scheduling for visit in November.  There is some improvement in her alkaline phosphatase, ALT/ALT.  No seizure activity on Onfi and Keppra.  We will keep appointment with Dr. Krista Blue in December.

## 2021-09-21 ENCOUNTER — Encounter (HOSPITAL_COMMUNITY): Payer: Self-pay | Admitting: Emergency Medicine

## 2021-09-21 ENCOUNTER — Other Ambulatory Visit: Payer: Self-pay

## 2021-09-21 ENCOUNTER — Emergency Department (HOSPITAL_COMMUNITY)
Admission: EM | Admit: 2021-09-21 | Discharge: 2021-09-21 | Disposition: A | Discharge status: Home / Self Care | Payer: Medicaid Other | Attending: Emergency Medicine | Admitting: Emergency Medicine

## 2021-09-21 ENCOUNTER — Emergency Department (HOSPITAL_COMMUNITY): Payer: Medicaid Other

## 2021-09-21 DIAGNOSIS — R569 Unspecified convulsions: Secondary | ICD-10-CM | POA: Insufficient documentation

## 2021-09-21 LAB — URINALYSIS, ROUTINE W REFLEX MICROSCOPIC
Bacteria, UA: NONE SEEN
Bilirubin Urine: NEGATIVE
Glucose, UA: NEGATIVE mg/dL
Ketones, ur: NEGATIVE mg/dL
Nitrite: NEGATIVE
Protein, ur: NEGATIVE mg/dL
Specific Gravity, Urine: 1.003 — ABNORMAL LOW (ref 1.005–1.030)
pH: 6 (ref 5.0–8.0)

## 2021-09-21 LAB — CBC WITH DIFFERENTIAL/PLATELET
Abs Immature Granulocytes: 0.05 10*3/uL (ref 0.00–0.07)
Basophils Absolute: 0 10*3/uL (ref 0.0–0.1)
Basophils Relative: 0 %
Eosinophils Absolute: 0 10*3/uL (ref 0.0–0.5)
Eosinophils Relative: 0 %
HCT: 41.7 % (ref 36.0–46.0)
Hemoglobin: 13.5 g/dL (ref 12.0–15.0)
Immature Granulocytes: 0 %
Lymphocytes Relative: 12 %
Lymphs Abs: 1.5 10*3/uL (ref 0.7–4.0)
MCH: 27.8 pg (ref 26.0–34.0)
MCHC: 32.4 g/dL (ref 30.0–36.0)
MCV: 85.8 fL (ref 80.0–100.0)
Monocytes Absolute: 0.9 10*3/uL (ref 0.1–1.0)
Monocytes Relative: 7 %
Neutro Abs: 10.4 10*3/uL — ABNORMAL HIGH (ref 1.7–7.7)
Neutrophils Relative %: 81 %
Platelets: 220 10*3/uL (ref 150–400)
RBC: 4.86 MIL/uL (ref 3.87–5.11)
RDW: 13.1 % (ref 11.5–15.5)
WBC: 12.9 10*3/uL — ABNORMAL HIGH (ref 4.0–10.5)
nRBC: 0 % (ref 0.0–0.2)

## 2021-09-21 LAB — COMPREHENSIVE METABOLIC PANEL
ALT: 66 U/L — ABNORMAL HIGH (ref 0–44)
AST: 59 U/L — ABNORMAL HIGH (ref 15–41)
Albumin: 4.2 g/dL (ref 3.5–5.0)
Alkaline Phosphatase: 245 U/L — ABNORMAL HIGH (ref 38–126)
Anion gap: 9 (ref 5–15)
BUN: 11 mg/dL (ref 6–20)
CO2: 23 mmol/L (ref 22–32)
Calcium: 10 mg/dL (ref 8.9–10.3)
Chloride: 104 mmol/L (ref 98–111)
Creatinine, Ser: 0.86 mg/dL (ref 0.44–1.00)
GFR, Estimated: >60 mL/min (ref >60)
Glucose, Bld: 91 mg/dL (ref 70–99)
Potassium: 3.9 mmol/L (ref 3.5–5.1)
Sodium: 136 mmol/L (ref 135–145)
Total Bilirubin: 0.6 mg/dL (ref 0.3–1.2)
Total Protein: 8.4 g/dL — ABNORMAL HIGH (ref 6.5–8.1)

## 2021-09-21 LAB — CBG MONITORING, ED: Glucose-Capillary: 83 mg/dL (ref 70–99)

## 2021-09-21 NOTE — Discharge Instructions (Addendum)
Follow-up with Dr. Krista Blue in no more than 2 weeks.  Keep taking your prescribed seizure medications.  Please return to the emergency department if you experience any additional seizures before you follow-up with neurology.

## 2021-09-21 NOTE — ED Provider Notes (Signed)
[facility] Provider Note   CSN: [id number] Arrival date & time: 09/21/21  1256     History Chief Complaint  Patient presents with   Seizures    Vanessa Zuniga is a 36 y.o. female with history of seizures currently on 4000 mg of Keppra daily in addition to clobazam nightly who presents to the emergency department today for further evaluation of a seizure.  Mother provides most of the history as the patient does not really recall the seizure.  Mother states that the patient just got back from taking out the trash when she said that her stomach was hurting and then she started babbling and talking incoherently.  She then started having some right-sided jerking.  This lasted for 4 to 5 minutes.  The mother tried to lower the patient to the ground but she did ultimately hit her head.  Mother states that after she came to she was confused for longer than normal.  Again, patient does not recall this event.  Per the mother, patient is currently at her baseline.  Her last seizure was back in June when they switched her to [place] from [place] secondary to elevated LFTs.  Patient has been compliant with her medications.   Seizures      Home Medications Prior to Admission medications   Medication Sig Start Date End Date Taking? Authorizing Provider  acetaminophen (TYLENOL) 500 MG tablet Take 1,000 mg by mouth every 6 (six) hours as needed for moderate pain or headache.   Yes [provider]  cloBAZam (ONFI) 10 MG tablet Take 1 tablet (10 mg total) by mouth at bedtime. 07/04/21  Yes Suzzanne Cloud, NP  ferrous sulfate (SLOW IRON) 160 (50 Fe) MG TBCR SR tablet Take 1 tablet by mouth daily.   Yes [provider]  levETIRAcetam (KEPPRA) 1000 MG tablet Take 2 tablets (2,000 mg total) by mouth 2 (two) times daily. 07/01/21  Yes Marcial Pacas, MD  Multiple Vitamin (MULTIVITAMIN) tablet Take 1 tablet by mouth daily.   Yes [provider]      Allergies     Sulfa antibiotics    Review of Systems   Review of Systems  Neurological:  Positive for seizures.  All other systems reviewed and are negative.   Physical Exam Updated Vital Signs BP 113/75   Pulse 80   Temp 98.3 F (36.8 C) (Oral)   Resp 18   Ht '5\' 5"'$  (1.651 m)   Wt 80.7 kg   LMP 09/07/2021   SpO2 100%   BMI 29.62 kg/m  Physical Exam Vitals and nursing note reviewed.  Constitutional:      General: She is not in acute distress.    Appearance: Normal appearance.  HENT:     Head: Normocephalic and atraumatic.  Eyes:     General:        Right eye: No discharge.        Left eye: No discharge.  Cardiovascular:     Comments: Regular rate and rhythm.  S1/S2 are distinct without any evidence of murmur, rubs, or gallops.  Radial pulses are 2+ bilaterally.  Dorsalis pedis pulses are 2+ bilaterally.  No evidence of pedal edema. Pulmonary:     Comments: Clear to auscultation bilaterally.  Normal effort.  No respiratory distress.  No evidence of wheezes, rales, or rhonchi heard throughout. Abdominal:     General: Abdomen is flat. Bowel sounds are normal. There is no distension.     Tenderness: There is  no abdominal tenderness. There is no guarding or rebound.  Musculoskeletal:        General: Normal range of motion.     Cervical back: Neck supple.  Skin:    General: Skin is warm and dry.     Findings: No rash.  Neurological:     General: No focal deficit present.     Mental Status: She is alert. Mental status is at baseline.     Cranial Nerves: Cranial nerves 2-12 are intact.     Sensory: Sensation is intact.     Motor: Motor function is intact.     Coordination: Coordination is intact.  Psychiatric:        Mood and Affect: Mood normal.        Behavior: Behavior normal.     ED Results / Procedures / Treatments   Labs (all labs ordered are listed, but only abnormal results are displayed) Labs Reviewed  CBC WITH DIFFERENTIAL/PLATELET - Abnormal; Notable for the  following components:      Result Value   WBC 12.9 (*)    Neutro Abs 10.4 (*)    All other components within normal limits  COMPREHENSIVE METABOLIC PANEL - Abnormal; Notable for the following components:   Total Protein 8.4 (*)    AST 59 (*)    ALT 66 (*)    Alkaline Phosphatase 245 (*)    All other components within normal limits  URINALYSIS, ROUTINE W REFLEX MICROSCOPIC - Abnormal; Notable for the following components:   Color, Urine STRAW (*)    Specific Gravity, Urine 1.003 (*)    Hgb urine dipstick SMALL (*)    Leukocytes,Ua MODERATE (*)    All other components within normal limits  LEVETIRACETAM LEVEL  CBG MONITORING, ED    EKG None  Radiology CT Head Wo Contrast  Result Date: 09/21/2021 CLINICAL DATA:  36 year old female with seizure and fall. EXAM: CT HEAD WITHOUT CONTRAST CT CERVICAL SPINE WITHOUT CONTRAST TECHNIQUE: Multidetector CT imaging of the head and cervical spine was performed following the standard protocol without intravenous contrast. Multiplanar CT image reconstructions of the cervical spine were also generated. RADIATION DOSE REDUCTION: This exam was performed according to the departmental dose-optimization program which includes automated exposure control, adjustment of the mA and/or kV according to patient size and/or use of iterative reconstruction technique. COMPARISON:  12/26/2019 head CT and prior studies FINDINGS: CT HEAD FINDINGS Brain: No evidence of acute infarction, hemorrhage, hydrocephalus, extra-axial collection or mass lesion/mass effect. Remote LEFT basal ganglia infarct again identified. Vascular: No hyperdense vessel or unexpected calcification. Skull: No acute abnormality. RIGHT temporal craniectomy changes again noted. Sinuses/Orbits: No acute finding. Other: None. CT CERVICAL SPINE FINDINGS Alignment: Straightening of the normal cervical lordosis noted. No subluxation identified. Skull base and vertebrae: No acute fracture. No primary bone lesion  or focal pathologic process. Soft tissues and spinal canal: No prevertebral fluid or swelling. No visible canal hematoma. Disc levels: Mild to moderate multilevel degenerative disc disease/spondylosis noted from C3-C7 contributing to mild to moderate multilevel central spinal and bony foraminal narrowing. Upper chest: No acute abnormality Other: None IMPRESSION: 1. No evidence of acute intracranial abnormality. Remote LEFT basal ganglia infarct. 2. No evidence of acute injury to the cervical spine. 3. Mild to moderate multilevel degenerative disc disease/spondylosis again noted from C3-C7 contributing to central spinal and bony foraminal narrowing. Electronically Signed   By: Margarette Canada M.D.   On: 09/21/2021 14:57   CT Cervical Spine Wo Contrast  Result Date: 09/21/2021  CLINICAL DATA:  36 year old female with seizure and fall. EXAM: CT HEAD WITHOUT CONTRAST CT CERVICAL SPINE WITHOUT CONTRAST TECHNIQUE: Multidetector CT imaging of the head and cervical spine was performed following the standard protocol without intravenous contrast. Multiplanar CT image reconstructions of the cervical spine were also generated. RADIATION DOSE REDUCTION: This exam was performed according to the departmental dose-optimization program which includes automated exposure control, adjustment of the mA and/or kV according to patient size and/or use of iterative reconstruction technique. COMPARISON:  12/26/2019 head CT and prior studies FINDINGS: CT HEAD FINDINGS Brain: No evidence of acute infarction, hemorrhage, hydrocephalus, extra-axial collection or mass lesion/mass effect. Remote LEFT basal ganglia infarct again identified. Vascular: No hyperdense vessel or unexpected calcification. Skull: No acute abnormality. RIGHT temporal craniectomy changes again noted. Sinuses/Orbits: No acute finding. Other: None. CT CERVICAL SPINE FINDINGS Alignment: Straightening of the normal cervical lordosis noted. No subluxation identified. Skull base  and vertebrae: No acute fracture. No primary bone lesion or focal pathologic process. Soft tissues and spinal canal: No prevertebral fluid or swelling. No visible canal hematoma. Disc levels: Mild to moderate multilevel degenerative disc disease/spondylosis noted from C3-C7 contributing to mild to moderate multilevel central spinal and bony foraminal narrowing. Upper chest: No acute abnormality Other: None IMPRESSION: 1. No evidence of acute intracranial abnormality. Remote LEFT basal ganglia infarct. 2. No evidence of acute injury to the cervical spine. 3. Mild to moderate multilevel degenerative disc disease/spondylosis again noted from C3-C7 contributing to central spinal and bony foraminal narrowing. Electronically Signed   By: Margarette Canada M.D.   On: 09/21/2021 14:57    Procedures Procedures    Medications Ordered in ED Medications - No data to display  ED Course/ Medical Decision Making/ A&P Clinical Course as of 09/21/21 1642  Sat Sep 21, 2021  1626 I spoke with Dr. Rory Percy with neurology who recommends holding course and having her follow-up with Dr. Krista Blue her outpatient neurologist in no more than 2 weeks.  If the patient has any more breakthrough seizures she should return to the emergency department immediately and we can talk about medication adjustments at that time. [CF]  1626 CBC with Differential(!) There is evidence of leukocytosis. [CF]  1627 Comprehensive metabolic panel(!) There is evidence of transaminitis which seems to be at the patient's baseline. [CF]  1627 Levetiracetam level In process. [CF]  9741 Urinalysis, Routine w reflex microscopic(!) There is small amount of blood in large amount of pyuria with no signs of contamination.  Patient is not having any urinary tract infectious symptoms. [CF]  6384 CT Cervical Spine Wo Contrast No evidence of acute fracture.  I personally ordered and interpreted this study.  I do agree with radiologist interpretation. [CF]  5364 CT Head  Wo Contrast CT scan of the head did not reveal any intercranial hemorrhage.  I do agree with radiologist interpretation.  I personally ordered interpreted the study. [CF]    Clinical Course User Index [CF] Hendricks Limes, PA-C                           Medical Decision Making JENAYA SAAR is a 36 y.o. female patient presents to the emergency department today for further evaluation of seizures.  Patient has a known seizure disorder.  I will obtain a Keppra level and get some basic labs to look for reversible causes.  Patient currently at baseline.  On reevaluation, patient still at baseline.  She has not had any  additional seizure activity.  Labs are all reassuring.  Her urinalysis does have a large amount of white cells and could be consistent with urinary tract infection however, patient is not having any symptoms.  I will defer antibiotic treatment at this time.  I will have her follow-up with her neurologist per recommendations of on-call neurology today.  I discussed strict return precautions with her and the family at the bedside.  They expressed full understanding.  She is safe for discharge at this time.   Amount and/or Complexity of Data Reviewed Labs: ordered. Decision-making details documented in ED Course. Radiology: ordered. Decision-making details documented in ED Course.   Final Clinical Impression(s) / ED Diagnoses Final diagnoses:  Seizure [facility])    Rx / [facility] Orders ED Discharge Orders     None         Hendricks Limes, PA-C 09/21/21 1642    [place], Johnson, DO 09/22/21 [id number]

## 2021-09-21 NOTE — ED Triage Notes (Signed)
Patient brought in via EMS from home. Alert and oriented. Airway patent. Patient brought in due to seizures. Patient has hx of seizures. Patient's medication changed in June to [place] due to liver enzymes and patient had first seizure in "years" after med change. Kepra dose was increased after. Patient had witnessed seizure that lasted approx 4-5 minutes. Mother states prior to seizure she was c/o stomach hurting, and her speech became incomprehensible,patient fell and mother tried to easy her to floor. Patient did hit back of head. Mother states patient's head tilted to right and extremities started jerking. Patient was postictal upon end of seizure and is now back to baseline. Patient c/o headache and mouth being sore. No active bleeding noted in or around mouth. Mother at beside. Per paramedic blood sugar 112, EKG normal sinus rhythm, patient hypotensive initially with last B/P of 97/52.

## 2021-09-21 NOTE — ED Triage Notes (Signed)
Patient now states head feels better but still c/o mouth pain.

## 2021-09-22 NOTE — ED Notes (Signed)
Pt was moved off the floor and never discharged by the primary nurse.

## 2021-09-23 ENCOUNTER — Telehealth: Payer: Self-pay | Admitting: Neurology

## 2021-09-23 NOTE — Telephone Encounter (Signed)
Pt's mother, Jone Baseman. Pt had a 09/21/21, Saturday am. Pt started complaining of stomach ache, started babbling, her head went to the right side, got her down to the floor, look like seizure was on her right side. Pt started shaking on her right side. Right side was stiffing up and she bump her head. Bit her tongue real  bad. Seizure lasted 4 to 5 minutes.The neurologist in [place] at [facility] [address] said the [place] is at its highest dose can be.

## 2021-09-23 NOTE — Telephone Encounter (Signed)
Sent mychart on this.

## 2021-09-24 LAB — LEVETIRACETAM LEVEL: Levetiracetam Lvl: 69.8 ug/mL — ABNORMAL HIGH (ref 10.0–40.0)

## 2021-09-24 MED ORDER — TOPIRAMATE 25 MG PO TABS: ORAL_TABLET | ORAL | 5 refills | Status: DC

## 2021-09-24 NOTE — Telephone Encounter (Signed)
Scheduled pt with Dr. April Manson for 10/16/21 at 11:45.

## 2021-09-24 NOTE — Telephone Encounter (Signed)
I called the patient, spoke with her mother in regards to recent seizure.  We will start Topamax titrating up to 100 mg twice a day.  She will continue the Onfi 10 mg at bedtime, Keppra 2000 mg twice daily.  I reviewed with Dr. April Manson, he is willing to see the patient to establish care, can we get her set up to see him at his next available?  I will send in the Topamax titration  Meds ordered this encounter  Medications   topiramate (TOPAMAX) 25 MG tablet    Sig: Take 1 tablet twice a day for 1 week, then take 2 tablets twice a day for 1 week, then take 3 tablets twice a day for 1 week then take 4 tablets twice day    Dispense:  140 tablet    Refill:  5

## 2021-09-24 NOTE — Addendum Note (Signed)
Addended by: Suzzanne Cloud on: 09/24/2021 03:03 PM   Modules accepted: Orders

## 2021-09-24 NOTE — Telephone Encounter (Signed)
Hi Team,   I am not sure if I am the primary or Dr. Krista Blue, but you can consider increasing her nightly Clobazam to 15 mg and watch for daytime somnolence. At next visit, if seizures are not controlled, discuss surgical option VNS vs. RNS

## 2021-10-01 ENCOUNTER — Ambulatory Visit: Payer: Medicaid Other | Admitting: Neurology

## 2021-10-16 ENCOUNTER — Encounter: Payer: Self-pay | Admitting: Neurology

## 2021-10-16 ENCOUNTER — Ambulatory Visit: Payer: Medicaid Other | Admitting: Neurology

## 2021-10-16 VITALS — BP 108/68 | HR 65 | Ht 64.0 in | Wt 179.0 lb

## 2021-10-16 DIAGNOSIS — Z8673 Personal history of transient ischemic attack (TIA), and cerebral infarction without residual deficits: Secondary | ICD-10-CM

## 2021-10-16 DIAGNOSIS — G40009 Localization-related (focal) (partial) idiopathic epilepsy and epileptic syndromes with seizures of localized onset, not intractable, without status epilepticus: Secondary | ICD-10-CM | POA: Diagnosis not present

## 2021-10-16 DIAGNOSIS — G40019 Localization-related (focal) (partial) idiopathic epilepsy and epileptic syndromes with seizures of localized onset, intractable, without status epilepticus: Secondary | ICD-10-CM | POA: Diagnosis not present

## 2021-10-16 MED ORDER — TOPIRAMATE 100 MG PO TABS: 100.0000 mg | ORAL_TABLET | Freq: Two times a day (BID) | ORAL | 11 refills | Status: DC

## 2021-10-16 NOTE — Progress Notes (Signed)
Patient: Vanessa Zuniga Date of Birth: [birth year]  Reason for Visit: Follow up for seizures History from: Patient, mother Primary Neurologist: Dr. April Manson  ASSESSMENT AND PLAN 36 y.o. year old female   1.  Cerebrovascular disease, right hemiparesis 2.  Intractable focal epilepsy 3.  Elevated alkaline phosphatase, AST, ALT  -Continue with higher dose Keppra 2000 mg twice a day,  Onfi 10 mg at bedtime and Topamax 100 mg BID.  last seizure was 09/21/21 -EEG was normal in May 2023 -Discussed VNS therapy, mother is knowledgeable and would like to move forward.  -Follow-up in 3 months   Meds ordered this encounter  Medications   topiramate (TOPAMAX) 100 MG tablet    Sig: Take 1 tablet (100 mg total) by mouth 2 (two) times daily.    Dispense:  60 tablet    Refill:  11    HISTORY OF PRESENT ILLNESS: Update 05/07/21 [name] here today for follow-up. Seeing GI, had liver biopsy, was unremarkable. Had LFT 04/23/21 Alk phos 354, AST 47, ALT 69. Here today to discuss medications, on Keppra 750 mg twice daily, Lamictal 200/300 mg daily. No seizures since Dec 2021. Liver biopsy was unremarkable. GI suggesting come off Lamictal? She feels fine, denies any problem or concerns. In past couldn't tolerate Vimpat. Was on Carbamazepine and Lamictal long term for seizures, d/c carbamazepine 2021 due to elevated alk phos long term.  Trend recap: -September 2021 alkaline phosphatase 320, AST 27, ALT 39, weaned off carbamazepine, continue Lamictal 300 mg twice daily, Vimpat was added -Seizure December 2021 on Vimpat 100 mg AM/150 MG PM and Lamictal 300 mg twice daily, alkaline phosphatase was 253, ALT 48, AST 35, had side effect of Vimpat, Keppra was added 750 mg twice daily -I saw her in March 2022, alkaline phosphatase was 305, AST 43, ALT 55 on Keppra 750 mg twice daily, Lamictal 300 mg twice daily, Lamictal level was 21.4 -Saw Dr. Jannifer Franklin in September 2022, alkaline phosphatase was 361, AST 42, ALT 46,  on Keppra 750 mg twice daily, Lamictal 300 mg twice daily, was referred to GI, Lamictal level was reduced 200/300 mg daily since Lamictal level was 29.4  July 04, 2021 SS: EEG was normal 05/14/21, last office visit 05/07/21 discontinuing Lamictal, increase Keppra 1500 mg twice a day. On 06/28/21 were out of town, in the morning, sitting on bed, told mom her stomach hurt, she had seizure 5 minutes later, was tonic clonic, she fell in between the bed and nightstand, bruised her back and arm. Was making gurgling noises. Had about 4 minutes of shaking, called EMS, was post-ictal, agitated. Wasn't transported. Has been 20 years since this kind of seizure. No incontinence, she did bite her tongue.    October 04/2021: AC:  Patient presents today for follow-up, she is accompanied by her mother.  They reported the seizures have not been well controlled June 16 she has a generalized tonic-clonic seizure.  Onfi was added.  She had another seizure on September 9 again described as generalized tonic seizure.  At that time we added Topamax.  Currently she is on Keppra 2000 mg twice daily, Onfi 10 mg at night and Topamax 100 mg twice daily.  She does report side effect of slow thought processing, difficulty with concentrating difficulty with basic math problems. They have not discussed VNS therapy previously.    REVIEW OF SYSTEMS: Out of a complete 14 system review of symptoms, the patient complains only of the following symptoms, and all other  reviewed systems are negative.  See HPI  ALLERGIES: Allergies  Allergen Reactions   Sulfa Antibiotics Hives    HOME MEDICATIONS: Outpatient Medications Prior to Visit  Medication Sig Dispense Refill   acetaminophen (TYLENOL) 500 MG tablet Take 1,000 mg by mouth every 6 (six) hours as needed for moderate pain or headache.     cloBAZam (ONFI) 10 MG tablet Take 1 tablet (10 mg total) by mouth at bedtime. 30 tablet 5   ferrous sulfate (SLOW IRON) 160 (50 Fe) MG TBCR SR  tablet Take 1 tablet by mouth daily.     levETIRAcetam (KEPPRA) 1000 MG tablet Take 2 tablets (2,000 mg total) by mouth 2 (two) times daily. 120 tablet 11   Multiple Vitamin (MULTIVITAMIN) tablet Take 1 tablet by mouth daily.     topiramate (TOPAMAX) 25 MG tablet Take 1 tablet twice a day for 1 week, then take 2 tablets twice a day for 1 week, then take 3 tablets twice a day for 1 week then take 4 tablets twice day 140 tablet 5   No facility-administered medications prior to visit.    PAST MEDICAL HISTORY: Past Medical History:  Diagnosis Date   Developmental delay    mental age of 33, per mother   Elevated LFTs    Gait abnormality 04/07/2018   History of stroke    age 28   Seizures ([place])    last seizure 06/2014    PAST SURGICAL HISTORY: Past Surgical History:  Procedure Laterality Date   BRAIN SURGERY     finger biopsy Right    MINOR EXCISION OF ORAL LESION N/A 05/01/2014   Procedure: EXCISION LIP MASS;  Surgeon: Leta Baptist, MD;  Location: [facility];  Service: ENT;  Laterality: N/A;    FAMILY HISTORY: Family History  Problem Relation Age of Onset   Colon polyps Mother    Asthma Maternal Grandmother    Diabetes Maternal Grandmother    Heart disease Maternal Grandfather    Lung cancer Paternal Grandmother    Colon cancer Maternal Aunt     SOCIAL HISTORY: Social History   Socioeconomic History   Marital status: Single    Spouse name: Not on file   Number of children: 0   Years of education: 12   Highest education level: Not on file  Occupational History   Not on file  Tobacco Use   Smoking status: Never   Smokeless tobacco: Never  Vaping Use   Vaping Use: Never used  Substance and Sexual Activity   Alcohol use: No   Drug use: No   Sexual activity: Never    Birth control/protection: None  Other Topics Concern   Not on file  Social History Narrative   Patient lives with mother [name].   Patient has no children.    Patient as a high school  education.   Patient is working.    Patient is single   Patient is left handed.   Patient does not drink caffeine.   Social Determinants of Health   Financial Resource Strain: Low Risk  (05/17/2020)   Overall Financial Resource Strain (CARDIA)    Difficulty of Paying Living Expenses: Not hard at all  Food Insecurity: No Food Insecurity (05/17/2020)   Hunger Vital Sign    Worried About Running Out of Food in the Last Year: Never true    Ran Out of Food in the Last Year: Never true  Transportation Needs: No Transportation Needs (05/17/2020)   PRAPARE - Transportation  Lack of Transportation (Medical): No    Lack of Transportation (Non-Medical): No  Physical Activity: Insufficiently Active (05/17/2020)   Exercise Vital Sign    Days of Exercise per Week: 2 days    Minutes of Exercise per Session: 20 min  Stress: No Stress Concern Present (05/17/2020)   [place]    Feeling of Stress : Not at all  Social Connections: Moderately Integrated (05/17/2020)   Social Connection and Isolation Panel [NHANES]    Frequency of Communication with Friends and Family: More than three times a week    Frequency of Social Gatherings with Friends and Family: Twice a week    Attends Religious Services: More than 4 times per year    Active Member of [employer] or Organizations: Yes    Attends Archivist Meetings: More than 4 times per year    Marital Status: Never married  Intimate Partner Violence: Not At Risk (05/17/2020)   Humiliation, Afraid, Rape, and Kick questionnaire    Fear of Current or Ex-Partner: No    Emotionally Abused: No    Physically Abused: No    Sexually Abused: No    PHYSICAL EXAM  Vitals:   10/16/21 1137  BP: 108/68  Pulse: 65  Weight: 179 lb (81.2 kg)  Height: _0  (1.626 m)    Body mass index is 30.73 kg/m.  Generalized: Well developed, in no acute distress  Neurological examination  Mentation: Alert  oriented to time, place, history is mostly provided by her mother. Follows all commands speech and language fluent. Mild psychomotor retardation.  Cranial nerve II-XII: Pupils were equal round reactive to light. Extraocular movements were full, visual field were full on confrontational test. Facial sensation and strength were normal. Head turning and shoulder shrug  were normal and symmetric.No oral injury noted. Motor: Good strength all extremities, slight weakness to right upper extremity, flexion of the right fingers/arm Sensory: Sensory testing is intact to soft touch on all 4 extremities. No evidence of extinction is noted.  Coordination: Cerebellar testing reveals good finger-nose-finger and heel-to-shin bilaterally.  Gait and station: Gait is slightly wide-based, slight limp on the right Reflexes: Deep tendon reflexes are symmetric but slightly increased on the right  DIAGNOSTIC DATA (LABS, IMAGING, TESTING) - I reviewed patient records, labs, notes, testing and imaging myself where available.  Lab Results  Component Value Date   WBC 12.9 (H) 09/21/2021   HGB 13.5 09/21/2021   HCT 41.7 09/21/2021   MCV 85.8 09/21/2021   PLT 220 09/21/2021      Component Value Date/Time   NA 136 09/21/2021 1502   NA 138 07/04/2021 1449   K 3.9 09/21/2021 1502   CL 104 09/21/2021 1502   CO2 23 09/21/2021 1502   GLUCOSE 91 09/21/2021 1502   BUN 11 09/21/2021 1502   BUN 8 07/04/2021 1449   CREATININE 0.86 09/21/2021 1502   CALCIUM 10.0 09/21/2021 1502   PROT 8.4 (H) 09/21/2021 1502   PROT 7.1 09/03/2021 1110   ALBUMIN 4.2 09/21/2021 1502   ALBUMIN 4.3 09/03/2021 1110   AST 59 (H) 09/21/2021 1502   ALT 66 (H) 09/21/2021 1502   ALKPHOS 245 (H) 09/21/2021 1502   BILITOT 0.6 09/21/2021 1502   BILITOT <0.2 09/03/2021 1110   GFRNONAA >60 09/21/2021 1502   GFRAA 116 10/11/2019 1516   No results found for: "CHOL", "HDL", "LDLCALC", "LDLDIRECT", "TRIG", "CHOLHDL" No results found for:  "HGBA1C" No results found for: "VITAMINB12" No  results found for: "TSH"  I have spent a total of 45 minutes dedicated to this patient today, preparing to see patient, performing a medically appropriate examination and evaluation, ordering tests and/or medications and procedures, and counseling and educating the patient/family/caregiver; independently interpreting result and communicating results to the family/patient/caregiver; and documenting clinical information in the electronic medical record.   Alric Ran, MD 10/16/2021, 2:30 PM [facility] [address], [place],  [address] [phone]

## 2021-10-25 ENCOUNTER — Telehealth: Payer: Self-pay

## 2021-10-25 NOTE — Telephone Encounter (Signed)
I have faxed VNS enrollment form to intake team, fax # [phone], confirmation received.

## 2021-11-11 NOTE — Telephone Encounter (Signed)
Updated clinical information has been faxed to [facility] with the requested diagnosis.

## 2021-11-11 NOTE — Telephone Encounter (Signed)
Received fax from [facility] asking for update on clinical dx. Form sts pt is scheduled for VNS re-implant on 11/11/2021 with Dr. Kathyrn Sheriff and requesting asking for ICD code from 10/16/21 visit to be changed from G40.009 to G40.909. Will fwd to MD.

## 2021-11-11 NOTE — Telephone Encounter (Signed)
Updated the dx to G40.019

## 2021-11-13 ENCOUNTER — Other Ambulatory Visit (INDEPENDENT_AMBULATORY_CARE_PROVIDER_SITE_OTHER): Payer: Medicaid Other

## 2021-11-13 DIAGNOSIS — R945 Abnormal results of liver function studies: Secondary | ICD-10-CM

## 2021-11-13 LAB — HEPATIC FUNCTION PANEL
ALT: 63 U/L — ABNORMAL HIGH (ref 0–35)
AST: 46 U/L — ABNORMAL HIGH (ref 0–37)
Albumin: 4.3 g/dL (ref 3.5–5.2)
Alkaline Phosphatase: 210 U/L — ABNORMAL HIGH (ref 39–117)
Bilirubin, Direct: 0 mg/dL (ref 0.0–0.3)
Total Bilirubin: 0.2 mg/dL (ref 0.2–1.2)
Total Protein: 7.7 g/dL (ref 6.0–8.3)

## 2021-11-19 ENCOUNTER — Other Ambulatory Visit: Payer: Self-pay | Admitting: Neurosurgery

## 2021-12-03 NOTE — Pre-Procedure Instructions (Signed)
Surgical Instructions    Your procedure is scheduled on Tuesday 12/10/21.   Report to [facility] Main Entrance "A" at 09:15 A.M., then check in with the Admitting office.  Call this number if you have problems the morning of surgery:  [phone]   If you have any questions prior to your surgery date call [phone]: Open Monday-Friday 8am-4pm If you experience any cold or flu symptoms such as cough, fever, chills, shortness of breath, etc. between now and your scheduled surgery, please notify us at the above number     Remember:  Do not eat or drink after midnight the night before your surgery    Take these medicines the morning of surgery with A SIP OF WATER:   levETIRAcetam (KEPPRA)   topiramate (TOPAMAX)    Take these medicines if needed:   acetaminophen (TYLENOL)   As of today, STOP taking any Aspirin (unless otherwise instructed by your surgeon) Aleve, Naproxen, Ibuprofen, Motrin, Advil, Goody's, BC's, all herbal medications, fish oil, and all vitamins.           Do not wear jewelry or makeup. Do not wear lotions, powders, perfumes/cologne or deodorant. Do not shave 48 hours prior to surgery.  Men may shave face and neck. Do not bring valuables to the hospital. Do not wear nail polish, gel polish, artificial nails, or any other type of covering on natural nails (fingers and toes) If you have artificial nails or gel coating that need to be removed by a nail salon, please have this removed prior to surgery. Artificial nails or gel coating may interfere with anesthesia's ability to adequately monitor your vital signs.  [facility] is not responsible for any belongings or valuables.    Do NOT Smoke (Tobacco/Vaping)  24 hours prior to your procedure  If you use a CPAP at night, you may bring your mask for your overnight stay.   Contacts, glasses, hearing aids, dentures or partials may not be worn into surgery, please bring cases for these belongings   For patients  admitted to the hospital, discharge time will be determined by your treatment team.   Patients discharged the day of surgery will not be allowed to drive home, and someone needs to stay with them for 24 hours.   SURGICAL WAITING ROOM VISITATION Patients having surgery or a procedure may have no more than 2 support people in the waiting area - these visitors may rotate.   Children under the age of 89 must have an adult with them who is not the patient. If the patient needs to stay at the hospital during part of their recovery, the visitor guidelines for inpatient rooms apply. Pre-op nurse will coordinate an appropriate time for 1 support person to accompany patient in pre-op.  This support person may not rotate.   Please refer to [email or web address] for the visitor guidelines for Inpatients (after your surgery is over and you are in a regular room).    Special instructions:    Oral Hygiene is also important to reduce your risk of infection.  Remember - BRUSH YOUR TEETH THE MORNING OF SURGERY WITH YOUR REGULAR TOOTHPASTE   [place]- Preparing For Surgery  Before surgery, you can play an important role. Because skin is not sterile, your skin needs to be as free of germs as possible. You can reduce the number of germs on your skin by washing with CHG (chlorahexidine gluconate) Soap before surgery.  CHG is an antiseptic cleaner which kills germs and bonds with  the skin to continue killing germs even after washing.     Please do not use if you have an allergy to CHG or antibacterial soaps. If your skin becomes reddened/irritated stop using the CHG.  Do not shave (including legs and underarms) for at least 48 hours prior to first CHG shower. It is OK to shave your face.  Please follow these instructions carefully.     Shower the NIGHT BEFORE SURGERY and the MORNING OF SURGERY with CHG Soap.   If you chose to wash your hair, wash your hair  first as usual with your normal shampoo. After you shampoo, rinse your hair and body thoroughly to remove the shampoo.  Then [employer] and genitals (private parts) with your normal soap and rinse thoroughly to remove soap.  After that Use CHG Soap as you would any other liquid soap. You can apply CHG directly to the skin and wash gently with a scrungie or a clean washcloth.   Apply the CHG Soap to your body ONLY FROM THE NECK DOWN.  Do not use on open wounds or open sores. Avoid contact with your eyes, ears, mouth and genitals (private parts). Wash Face and genitals (private parts)  with your normal soap.   Wash thoroughly, paying special attention to the area where your surgery will be performed.  Thoroughly rinse your body with warm water from the neck down.  DO NOT shower/wash with your normal soap after using and rinsing off the CHG Soap.  Pat yourself dry with a CLEAN TOWEL.  Wear CLEAN PAJAMAS to bed the night before surgery  Place CLEAN SHEETS on your bed the night before your surgery  DO NOT SLEEP WITH PETS.   Day of Surgery:  Take a shower with CHG soap. Wear Clean/Comfortable clothing the morning of surgery Do not apply any deodorants/lotions.   Remember to brush your teeth WITH YOUR REGULAR TOOTHPASTE.    If you received a COVID test during your pre-op visit, it is requested that you wear a mask when out in public, stay away from anyone that may not be feeling well, and notify your surgeon if you develop symptoms. If you have been in contact with anyone that has tested positive in the last 10 days, please notify your surgeon.    Please read over the following fact sheets that you were given.

## 2021-12-04 ENCOUNTER — Encounter (HOSPITAL_COMMUNITY): Payer: Self-pay

## 2021-12-04 ENCOUNTER — Other Ambulatory Visit: Payer: Self-pay

## 2021-12-04 ENCOUNTER — Encounter (HOSPITAL_COMMUNITY)
Admission: RE | Admit: 2021-12-04 | Discharge: 2021-12-04 | Disposition: A | Discharge status: Home / Self Care | Payer: Medicaid Other | Source: Ambulatory Visit | Attending: Neurosurgery | Admitting: Neurosurgery

## 2021-12-04 VITALS — BP 138/77 | HR 60 | Temp 97.6°F | Resp 17 | Ht 64.0 in | Wt 179.7 lb

## 2021-12-04 DIAGNOSIS — Z01818 Encounter for other preprocedural examination: Secondary | ICD-10-CM | POA: Insufficient documentation

## 2021-12-04 DIAGNOSIS — G40209 Localization-related (focal) (partial) symptomatic epilepsy and epileptic syndromes with complex partial seizures, not intractable, without status epilepticus: Secondary | ICD-10-CM | POA: Diagnosis not present

## 2021-12-04 LAB — CBC
HCT: 38.2 % (ref 36.0–46.0)
Hemoglobin: 12.4 g/dL (ref 12.0–15.0)
MCH: 27.7 pg (ref 26.0–34.0)
MCHC: 32.5 g/dL (ref 30.0–36.0)
MCV: 85.5 fL (ref 80.0–100.0)
Platelets: 405 10*3/uL — ABNORMAL HIGH (ref 150–400)
RBC: 4.47 MIL/uL (ref 3.87–5.11)
RDW: 12.8 % (ref 11.5–15.5)
WBC: 9.3 10*3/uL (ref 4.0–10.5)
nRBC: 0 % (ref 0.0–0.2)

## 2021-12-04 LAB — BASIC METABOLIC PANEL
Anion gap: 11 (ref 5–15)
BUN: 14 mg/dL (ref 6–20)
CO2: 20 mmol/L — ABNORMAL LOW (ref 22–32)
Calcium: 9.9 mg/dL (ref 8.9–10.3)
Chloride: 107 mmol/L (ref 98–111)
Creatinine, Ser: 0.85 mg/dL (ref 0.44–1.00)
GFR, Estimated: >60 mL/min (ref >60)
Glucose, Bld: 92 mg/dL (ref 70–99)
Potassium: 4 mmol/L (ref 3.5–5.1)
Sodium: 138 mmol/L (ref 135–145)

## 2021-12-04 LAB — SURGICAL PCR SCREEN
MRSA, PCR: NEGATIVE
Staphylococcus aureus: NEGATIVE

## 2021-12-04 NOTE — Progress Notes (Signed)
PCP - Vanessa Zuniga Cardiologist - denies Neurologist: Alric Ran  PPM/ICD - denies   Chest x-ray - n/a EKG - 09/23/21 Stress Test - denies ECHO - denies Cardiac Cath - denies  Sleep Study - denies   As of today, STOP taking any Aspirin (unless otherwise instructed by your surgeon) Aleve, Naproxen, Ibuprofen, Motrin, Advil, Goody's, BC's, all herbal medications, fish oil, and all vitamins.   ERAS Protcol -no   COVID TEST- not needed   Anesthesia review: yes, history of stroke and epilepsy  Patient denies shortness of breath, fever, cough and chest pain at PAT appointment   All instructions explained to the patient, with a verbal understanding of the material. Patient agrees to go over the instructions while at home for a better understanding. Patient also instructed to self quarantine after being tested for COVID-19. The opportunity to ask questions was provided.

## 2021-12-09 NOTE — Anesthesia Preprocedure Evaluation (Signed)
Anesthesia Evaluation  Patient identified by MRN, date of birth, ID band Patient awake  General Assessment Comment:History noted Dr. Nyoka Cowden  Reviewed: Allergy & Precautions, NPO status   Airway Mallampati: II       Dental   Pulmonary neg pulmonary ROS   breath sounds clear to auscultation       Cardiovascular negative cardio ROS  Rhythm:Regular Rate:Normal     Neuro/Psych Seizures -,     GI/Hepatic negative GI ROS, Neg liver ROS,,,  Endo/Other  negative endocrine ROS    Renal/GU negative Renal ROS     Musculoskeletal   Abdominal   Peds  Hematology   Anesthesia Other Findings   Reproductive/Obstetrics                             Anesthesia Physical Anesthesia Plan  ASA: 3  Anesthesia Plan: General   Post-op Pain Management:    Induction: Intravenous  PONV Risk Score and Plan: 4 or greater and Ondansetron and Dexamethasone  Airway Management Planned: Oral ETT  Additional Equipment:   Intra-op Plan:   Post-operative Plan: Extubation in OR  Informed Consent: I have reviewed the patients History and Physical, chart, labs and discussed the procedure including the risks, benefits and alternatives for the proposed anesthesia with the patient or authorized representative who has indicated his/her understanding and acceptance.     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  She also has developmental delay.  She is maintained on high-dose Keppra 2000 mg twice daily, Onfi 10 mg at bedtime and Topamax 100 mg twice daily.  Per neurology notes, last seizure 09/21/2021.  History of mildly elevated alkaline phosphatase, AST, ALT.  She has been evaluated by  gastroenterology and had a benign liver biopsy.  Felt possibly related to Lamictal, this was discontinued and April 2023.  Labs are also followed serially by neurology.  Hepatic function panel 11/13/2021 showed alk phos 210, AST 46, ALT 63.  Preop CBC and BMP 12/04/2021 reviewed, unremarkable.  EKG 09/21/2021: NSR.  Rate 97.  Nonspecific T wave abnormality.   )        Anesthesia Quick Evaluation

## 2021-12-09 NOTE — Progress Notes (Signed)
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  She also has developmental delay.  She is maintained on high-dose Keppra 2000 mg twice daily, Onfi 10 mg at bedtime and Topamax 100 mg twice daily.  Per neurology notes, last seizure 09/21/2021.  History of mildly elevated alkaline phosphatase, AST, ALT.  She has been evaluated by gastroenterology and had a benign liver biopsy.  Felt possibly related to Lamictal, this was discontinued and April 2023.  Labs are also followed serially by neurology.  Hepatic function panel 11/13/2021 showed alk phos 210, AST 46, ALT 63.  Preop CBC and BMP 12/04/2021 reviewed, unremarkable.  EKG 09/21/2021: NSR.  Rate 97.  Nonspecific T wave abnormality.    Wynonia Musty [facility] Short Stay Center/Anesthesiology Phone [phone] 12/09/2021 10:04 AM

## 2021-12-10 ENCOUNTER — Encounter (HOSPITAL_COMMUNITY): Payer: Self-pay | Admitting: Neurosurgery

## 2021-12-10 ENCOUNTER — Encounter (HOSPITAL_COMMUNITY)
Admission: RE | Disposition: A | Discharge status: Home / Self Care | Payer: Self-pay | Source: Home / Self Care | Attending: Neurosurgery

## 2021-12-10 ENCOUNTER — Ambulatory Visit (HOSPITAL_COMMUNITY): Payer: Medicaid Other | Admitting: Vascular Surgery

## 2021-12-10 ENCOUNTER — Other Ambulatory Visit: Payer: Self-pay

## 2021-12-10 ENCOUNTER — Ambulatory Visit (HOSPITAL_COMMUNITY)
Admission: RE | Admit: 2021-12-10 | Discharge: 2021-12-10 | Disposition: A | Discharge status: Home / Self Care | Payer: Medicaid Other | Attending: Neurosurgery | Admitting: Neurosurgery

## 2021-12-10 ENCOUNTER — Ambulatory Visit (HOSPITAL_BASED_OUTPATIENT_CLINIC_OR_DEPARTMENT_OTHER): Payer: Medicaid Other | Admitting: Physician Assistant

## 2021-12-10 DIAGNOSIS — Z79899 Other long term (current) drug therapy: Secondary | ICD-10-CM | POA: Insufficient documentation

## 2021-12-10 DIAGNOSIS — Z01818 Encounter for other preprocedural examination: Secondary | ICD-10-CM

## 2021-12-10 DIAGNOSIS — G40919 Epilepsy, unspecified, intractable, without status epilepticus: Secondary | ICD-10-CM | POA: Insufficient documentation

## 2021-12-10 DIAGNOSIS — G40209 Localization-related (focal) (partial) symptomatic epilepsy and epileptic syndromes with complex partial seizures, not intractable, without status epilepticus: Secondary | ICD-10-CM

## 2021-12-10 HISTORY — PX: VAGUS NERVE STIMULATOR INSERTION: SHX348

## 2021-12-10 LAB — POCT PREGNANCY, URINE: Preg Test, Ur: NEGATIVE

## 2021-12-10 SURGERY — VAGAL NERVE STIMULATOR IMPLANT
Anesthesia: General | Site: Neck | Laterality: Left

## 2021-12-10 MED ORDER — LIDOCAINE 2% (20 MG/ML) 5 ML SYRINGE
INTRAMUSCULAR | Status: DC | PRN
  Administered 2021-12-10: 100 mg via INTRAVENOUS

## 2021-12-10 MED ORDER — CEFAZOLIN SODIUM-DEXTROSE 2-4 GM/100ML-% IV SOLN
2.0000 g | INTRAVENOUS | Status: AC
  Administered 2021-12-10: 2 g via INTRAVENOUS
  Filled 2021-12-10: qty 100

## 2021-12-10 MED ORDER — LACTATED RINGERS IV SOLN: INTRAVENOUS | Status: DC

## 2021-12-10 MED ORDER — PROPOFOL 10 MG/ML IV BOLUS
INTRAVENOUS | Status: DC | PRN
  Administered 2021-12-10: 50 mg via INTRAVENOUS

## 2021-12-10 MED ORDER — EPHEDRINE 5 MG/ML INJ
INTRAVENOUS | Status: AC
  Filled 2021-12-10: qty 5

## 2021-12-10 MED ORDER — THROMBIN 5000 UNITS EX SOLR
CUTANEOUS | Status: AC
  Filled 2021-12-10: qty 5000

## 2021-12-10 MED ORDER — ONDANSETRON HCL 4 MG/2ML IJ SOLN
INTRAMUSCULAR | Status: AC
  Filled 2021-12-10: qty 2

## 2021-12-10 MED ORDER — CHLORHEXIDINE GLUCONATE 0.12 % MT SOLN
15.0000 mL | Freq: Once | OROMUCOSAL | Status: AC
  Administered 2021-12-10: 15 mL via OROMUCOSAL
  Filled 2021-12-10: qty 15

## 2021-12-10 MED ORDER — FENTANYL CITRATE (PF) 250 MCG/5ML IJ SOLN
INTRAMUSCULAR | Status: DC | PRN
  Administered 2021-12-10: 50 ug via INTRAVENOUS
  Administered 2021-12-10 (×2): 100 ug via INTRAVENOUS

## 2021-12-10 MED ORDER — ROCURONIUM BROMIDE 10 MG/ML (PF) SYRINGE
PREFILLED_SYRINGE | INTRAVENOUS | Status: AC
  Filled 2021-12-10: qty 20

## 2021-12-10 MED ORDER — PHENYLEPHRINE HCL-NACL 20-0.9 MG/250ML-% IV SOLN
INTRAVENOUS | Status: DC | PRN
  Administered 2021-12-10: 10 ug/min via INTRAVENOUS

## 2021-12-10 MED ORDER — DEXAMETHASONE SODIUM PHOSPHATE 10 MG/ML IJ SOLN
INTRAMUSCULAR | Status: DC | PRN
  Administered 2021-12-10: 10 mg via INTRAVENOUS

## 2021-12-10 MED ORDER — CHLORHEXIDINE GLUCONATE CLOTH 2 % EX PADS: 6.0000 | MEDICATED_PAD | Freq: Once | CUTANEOUS | Status: DC

## 2021-12-10 MED ORDER — PHENYLEPHRINE 80 MCG/ML (10ML) SYRINGE FOR IV PUSH (FOR BLOOD PRESSURE SUPPORT)
PREFILLED_SYRINGE | INTRAVENOUS | Status: AC
  Filled 2021-12-10: qty 40

## 2021-12-10 MED ORDER — ROCURONIUM BROMIDE 10 MG/ML (PF) SYRINGE
PREFILLED_SYRINGE | INTRAVENOUS | Status: DC | PRN
  Administered 2021-12-10 (×2): 40 mg via INTRAVENOUS

## 2021-12-10 MED ORDER — SUCCINYLCHOLINE CHLORIDE 200 MG/10ML IV SOSY
PREFILLED_SYRINGE | INTRAVENOUS | Status: AC
  Filled 2021-12-10: qty 10

## 2021-12-10 MED ORDER — FENTANYL CITRATE (PF) 100 MCG/2ML IJ SOLN: 25.0000 ug | INTRAMUSCULAR | Status: DC | PRN

## 2021-12-10 MED ORDER — MIDAZOLAM HCL 2 MG/2ML IJ SOLN
INTRAMUSCULAR | Status: AC
  Filled 2021-12-10: qty 2

## 2021-12-10 MED ORDER — FENTANYL CITRATE (PF) 250 MCG/5ML IJ SOLN
INTRAMUSCULAR | Status: AC
  Filled 2021-12-10: qty 5

## 2021-12-10 MED ORDER — SUGAMMADEX SODIUM 200 MG/2ML IV SOLN
INTRAVENOUS | Status: DC | PRN
  Administered 2021-12-10: 161.4 mg via INTRAVENOUS

## 2021-12-10 MED ORDER — ORAL CARE MOUTH RINSE: 15.0000 mL | Freq: Once | OROMUCOSAL | Status: AC

## 2021-12-10 MED ORDER — SUCCINYLCHOLINE CHLORIDE 200 MG/10ML IV SOSY
PREFILLED_SYRINGE | INTRAVENOUS | Status: DC | PRN
  Administered 2021-12-10: 120 mg via INTRAVENOUS

## 2021-12-10 MED ORDER — ONDANSETRON HCL 4 MG/2ML IJ SOLN
INTRAMUSCULAR | Status: DC | PRN
  Administered 2021-12-10: 4 mg via INTRAVENOUS

## 2021-12-10 MED ORDER — MIDAZOLAM HCL 2 MG/2ML IJ SOLN
INTRAMUSCULAR | Status: DC | PRN
  Administered 2021-12-10: 2 mg via INTRAVENOUS

## 2021-12-10 MED ORDER — LIDOCAINE 2% (20 MG/ML) 5 ML SYRINGE
INTRAMUSCULAR | Status: AC
  Filled 2021-12-10: qty 5

## 2021-12-10 MED ORDER — LIDOCAINE-EPINEPHRINE 1 %-1:100000 IJ SOLN
INTRAMUSCULAR | Status: DC | PRN
  Administered 2021-12-10: 18 mL via SURGICAL_CAVITY

## 2021-12-10 MED ORDER — PROPOFOL 500 MG/50ML IV EMUL
INTRAVENOUS | Status: DC | PRN
  Administered 2021-12-10: 75 ug/kg/min via INTRAVENOUS

## 2021-12-10 MED ORDER — DEXAMETHASONE SODIUM PHOSPHATE 10 MG/ML IJ SOLN
INTRAMUSCULAR | Status: AC
  Filled 2021-12-10: qty 1

## 2021-12-10 MED ORDER — LIDOCAINE-EPINEPHRINE 1 %-1:100000 IJ SOLN
INTRAMUSCULAR | Status: AC
  Filled 2021-12-10: qty 1

## 2021-12-10 MED ORDER — PHENYLEPHRINE 80 MCG/ML (10ML) SYRINGE FOR IV PUSH (FOR BLOOD PRESSURE SUPPORT)
PREFILLED_SYRINGE | INTRAVENOUS | Status: DC | PRN
  Administered 2021-12-10: 160 ug via INTRAVENOUS

## 2021-12-10 MED ORDER — 0.9 % SODIUM CHLORIDE (POUR BTL) OPTIME
TOPICAL | Status: DC | PRN
  Administered 2021-12-10: 1000 mL

## 2021-12-10 MED ORDER — THROMBIN 5000 UNITS EX SOLR: OROMUCOSAL | Status: DC | PRN

## 2021-12-10 MED ORDER — HYDROCODONE-ACETAMINOPHEN 5-325 MG PO TABS: 1.0000 | ORAL_TABLET | Freq: Four times a day (QID) | ORAL | 0 refills | Status: AC | PRN

## 2021-12-10 MED ORDER — BUPIVACAINE HCL (PF) 0.5 % IJ SOLN
INTRAMUSCULAR | Status: AC
  Filled 2021-12-10: qty 30

## 2021-12-10 SURGICAL SUPPLY — 55 items
ADH SKN CLS APL DERMABOND .7 (GAUZE/BANDAGES/DRESSINGS) ×1
APL SKNCLS STERI-STRIP NONHPOA (GAUZE/BANDAGES/DRESSINGS)
BAG COUNTER SPONGE SURGICOUNT (BAG) ×1 IMPLANT
BAG SPNG CNTER NS LX DISP (BAG) ×1
BAND INSRT 18 STRL LF DISP RB (MISCELLANEOUS) ×2
BAND RUBBER #18 3X1/16 STRL (MISCELLANEOUS) ×2 IMPLANT
BENZOIN TINCTURE PRP APPL 2/3 (GAUZE/BANDAGES/DRESSINGS) IMPLANT
BLADE CLIPPER SURG (BLADE) IMPLANT
BLADE SURG 11 STRL SS (BLADE) ×1 IMPLANT
CANISTER SUCT 3000ML PPV (MISCELLANEOUS) ×1 IMPLANT
DERMABOND ADVANCED .7 DNX12 (GAUZE/BANDAGES/DRESSINGS) ×1 IMPLANT
DRAPE CAMERA VIDEO/LASER (DRAPES) ×1 IMPLANT
DRAPE HALF SHEET 40X57 (DRAPES) IMPLANT
DRAPE LAPAROTOMY 100X72 PEDS (DRAPES) ×1 IMPLANT
DRAPE MICROSCOPE SLANT 54X150 (MISCELLANEOUS) ×1 IMPLANT
DRSG OPSITE POSTOP 3X4 (GAUZE/BANDAGES/DRESSINGS) IMPLANT
DRSG TEGADERM 4X4.75 (GAUZE/BANDAGES/DRESSINGS) ×4 IMPLANT
DURAPREP 6ML APPLICATOR 50/CS (WOUND CARE) ×1 IMPLANT
ELECT COATED BLADE 2.86 ST (ELECTRODE) ×1 IMPLANT
ELECT REM PT RETURN 9FT ADLT (ELECTROSURGICAL) ×1
ELECTRODE REM PT RTRN 9FT ADLT (ELECTROSURGICAL) ×1 IMPLANT
GAUZE 4X4 16PLY ~~LOC~~+RFID DBL (SPONGE) IMPLANT
GENERATOR M1000-D SENTIVA DUO (Generator) IMPLANT
GLOVE BIO SURGEON STRL SZ7.5 (GLOVE) ×1 IMPLANT
GLOVE BIOGEL PI IND STRL 7.5 (GLOVE) ×2 IMPLANT
GLOVE ECLIPSE 7.0 STRL STRAW (GLOVE) ×1 IMPLANT
GOWN STRL REUS W/ TWL LRG LVL3 (GOWN DISPOSABLE) ×2 IMPLANT
GOWN STRL REUS W/ TWL XL LVL3 (GOWN DISPOSABLE) IMPLANT
GOWN STRL REUS W/TWL 2XL LVL3 (GOWN DISPOSABLE) IMPLANT
GOWN STRL REUS W/TWL LRG LVL3 (GOWN DISPOSABLE) ×2
GOWN STRL REUS W/TWL XL LVL3 (GOWN DISPOSABLE)
HEMOSTAT POWDER KIT SURGIFOAM (HEMOSTASIS) ×1 IMPLANT
KIT BASIN OR (CUSTOM PROCEDURE TRAY) ×1 IMPLANT
KIT TURNOVER KIT B (KITS) ×1 IMPLANT
LEAD PERENNIAFLEX 2-3 304 (Neuro Prosthesis/Implant) IMPLANT
LOOP VESSEL MAXI BLUE (MISCELLANEOUS) IMPLANT
LOOP VESSEL MINI RED (MISCELLANEOUS) IMPLANT
NEEDLE HYPO 22GX1.5 SAFETY (NEEDLE) ×1 IMPLANT
NS IRRIG 1000ML POUR BTL (IV SOLUTION) ×1 IMPLANT
PACK LAMINECTOMY NEURO (CUSTOM PROCEDURE TRAY) ×1 IMPLANT
PAD ARMBOARD 7.5X6 YLW CONV (MISCELLANEOUS) ×3 IMPLANT
SLEEVE SCD COMPRESS KNEE MED (STOCKING) IMPLANT
SPIKE FLUID TRANSFER (MISCELLANEOUS) ×1 IMPLANT
SPONGE INTESTINAL PEANUT (DISPOSABLE) IMPLANT
SPONGE SURGIFOAM ABS GEL SZ50 (HEMOSTASIS) IMPLANT
SUT ETHILON 3 0 FSL (SUTURE) IMPLANT
SUT NURALON 4 0 TR CR/8 (SUTURE) ×1 IMPLANT
SUT VIC AB 0 CT1 27 (SUTURE) ×1
SUT VIC AB 0 CT1 27XBRD ANBCTR (SUTURE) ×1 IMPLANT
SUT VIC AB 3-0 SH 8-18 (SUTURE) ×1 IMPLANT
SUT VICRYL 3-0 RB1 18 ABS (SUTURE) ×2 IMPLANT
TOWEL GREEN STERILE (TOWEL DISPOSABLE) ×1 IMPLANT
TOWEL GREEN STERILE FF (TOWEL DISPOSABLE) ×1 IMPLANT
TUNNELING TOOL (MISCELLANEOUS) IMPLANT
WATER STERILE IRR 1000ML POUR (IV SOLUTION) ×1 IMPLANT

## 2021-12-10 NOTE — Discharge Summary (Signed)
  Physician Discharge Summary  Patient ID: Vanessa Zuniga MRN: [id number] DOB/AGE: 04/26/1985 36 y.o.  Admit date: 12/10/2021 Discharge date: 12/10/2021  Admission Diagnoses:  Medically intractable epilepsy  Discharge Diagnoses:  Same Active Problems:   * No active hospital problems. *   Discharged Condition: Stable  Hospital Course:  Vanessa Zuniga is a 36 y.o. female who underwent uncomplicated placement of a left VNS. She was at baseline postop and discharged home from PACU in stable condition.  Treatments: Surgery - Placement of left VNS  Discharge Exam: Blood pressure 121/76, pulse 74, temperature 97.9 F (36.6 C), temperature source Oral, resp. rate 18, height '5\' 4"'$  (1.626 m), weight 80.7 kg, last menstrual period 11/26/2021, SpO2 100 %. Awake, alert, oriented Speech fluent, appropriate CN grossly intact 5/5 BUE/BLE Wound c/d/i  Disposition: Discharge disposition: 01-Home or Self Care       Discharge Instructions     Call MD for:  redness, tenderness, or signs of infection (pain, swelling, redness, odor or green/yellow discharge around incision site)   Complete by: As directed    Call MD for:  temperature >100.4   Complete by: As directed    Diet - low sodium heart healthy   Complete by: As directed    Discharge instructions   Complete by: As directed    Walk at home as much as possible, at least 4 times / day   Increase activity slowly   Complete by: As directed    Lifting restrictions   Complete by: As directed    No lifting > 10 lbs   May shower / Bathe   Complete by: As directed    48 hours after surgery   May walk up steps   Complete by: As directed    Other Restrictions   Complete by: As directed    No bending/twisting at waist      Allergies as of 12/10/2021       Reactions   Sulfa Antibiotics Hives        Medication List     TAKE these medications    acetaminophen 500 MG tablet Commonly known as: TYLENOL Take 1,000 mg by  mouth every 6 (six) hours as needed for moderate pain or headache.   cloBAZam 10 MG tablet Commonly known as: ONFI Take 1 tablet (10 mg total) by mouth at bedtime.   HYDROcodone-acetaminophen 5-325 MG tablet Commonly known as: NORCO/VICODIN Take 1 tablet by mouth every 6 (six) hours as needed for up to 3 days for moderate pain.   levETIRAcetam 1000 MG tablet Commonly known as: Keppra Take 2 tablets (2,000 mg total) by mouth 2 (two) times daily.   multivitamin tablet Take 1 tablet by mouth daily.   Slow Iron 160 (50 Fe) MG Tbcr SR tablet Generic drug: ferrous sulfate Take 1 tablet by mouth daily.   topiramate 100 MG tablet Commonly known as: TOPAMAX Take 1 tablet (100 mg total) by mouth 2 (two) times daily.        Follow-up Information     Consuella Lose, MD Follow up.   Specialty: Neurosurgery Contact information: [address] [place] [address] [phone]                 Signed: Jairo Ben 12/10/2021, 3:16 PM

## 2021-12-10 NOTE — Op Note (Signed)
  NEUROSURGERY OPERATIVE NOTE   PREOP DIAGNOSIS: Medically intractable epilepsy   POSTOP DIAGNOSIS: Same  PROCEDURE: 1. Placement of left vagus nerve stimulator  SURGEON: Dr. Consuella Lose, MD  ASSISTANT: Dr. Duffy Rhody, MD  ANESTHESIA: General Endotracheal  EBL: Minimal  SPECIMENS: None  DRAINS: None  COMPLICATIONS: None immediate  CONDITION: Hemodynamically stable to PACU  HISTORY: Vanessa Zuniga is a 36 y.o.  female who was initially seen in the outpatient clinic as a referral from neurology for medically intractable epilepsy. She continues to have seizures despite multiple AEDs. The patient appeared to be a good candidate for the procedure. The risks and benefits of the surgery were reviewed in detail with the patient and her parents. After all questions were answered, informed consent was obtained.  PROCEDURE IN DETAIL: After informed consent was obtained and witnessed, the patient was brought to the operating room. After induction of general anesthesia, the patient was positioned on the operative table in the supine position. All pressure points were meticulously padded. Skin incisions were then marked out and prepped and draped in the usual sterile fashion.  After timeout was conducted, left transverse neck skin incision was infiltrated with local anesthetic with epinephrine. Skin incision was then made sharply, and Bovie electrocautery was used to dissect the subcutaneous tissue. The platysma muscle was identified and incised. The platysma was then undermined. The medial border of the sternocleidomastoid muscle was identified, and dissection was carried out utilizing natural tissue planes of the neck until the carotid sheath was identified. The jugular vein was initially identified and the carotid sheath was incised. The vagus nerve was then identified running between the carotid and jugular. The vagus nerve was circumferentially dissected for length of aproximately  2-1/2 cm. The infraclavicular incision was then infiltrated with local and opened sharply, and Bovie electrocautery was used to dissect the subcutaneous tissue. A subcutaneous pocket was then made above the pectorlis fascia. A subcutaneous tunneler was then passed from the neck to the infraclavicular incision. The stimulator leads were then tunneled. The leads were then placed around the vagus nerve. The generator was then connected to the leads, and placed in the pocket.  Testing was done to confirm normal impedance, and good placement and heart rate detection.   Wounds were then irrigated with copious amounts of normal saline irrigation. Relaxing curl was placed on the neck leads and tacked to the SCM. The platysma was closed using interrupted 3-0 Vicryl stitches. Subcutaneous layer and the subclavicular incision was closed using interrupted 3-0 Vicryl stitches. Skin was then closed using interrupted 3-0 Vicryl, and a layer of Dermabond was applied.   At the end of the case all sponge, needle, cottonoid, and instrument counts were correct. The patient was then extubated, transferred to the stretcher, and taken to the postanesthesia care unit in stable hemodynamic condition.   Consuella Lose, MD [facility]

## 2021-12-10 NOTE — Anesthesia Procedure Notes (Signed)
Procedure Name: Intubation Date/Time: 12/10/2021 1:20 PM  Performed by: Minerva Ends, CRNAPre-anesthesia Checklist: Patient identified, Emergency Drugs available, Suction available and Patient being monitored Patient Re-evaluated:Patient Re-evaluated prior to induction Oxygen Delivery Method: Circle system utilized Preoxygenation: Pre-oxygenation with 100% oxygen Induction Type: IV induction Ventilation: Mask ventilation without difficulty Laryngoscope Size: Mac and 3 Grade View: Grade I Tube type: Oral Tube size: 7.0 mm Number of attempts: 1 Airway Equipment and Method: Stylet and Oral airway Placement Confirmation: ETT inserted through vocal cords under direct vision, positive ETCO2 and breath sounds checked- equal and bilateral Secured at: 22 cm Tube secured with: Tape Dental Injury: Teeth and Oropharynx as per pre-operative assessment

## 2021-12-10 NOTE — H&P (Signed)
Chief Complaint   Epilepsy  History of Present Illness  Vanessa Zuniga is a 36 year old woman who I am seeing at the request of Dr. April Manson who is referred for possible vagus nerve stimulator placement. She was accompanied by her mother and father in the office who helped provide history. 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 Initially, her seizures apparently were very well controlled with medication and she went for a nearly 20 year. Without having any seizures. Unfortunately, last year she was noted to have elevation in her liver function tests. She was taken off some of her antiepileptic medications and her seizures began to return. She continues to have seizures every few months. She remains on very high doses of anti epileptic medications including Keppra, Topamax, Onfi. Her mother reports significant side effects with these medications.Of note, there is no other significant medical history denies hypertension, diabetes, history of heart disease or stroke. No lung, or kidney disease. There is no oncologic history. She is a nonsmoker. She is not on any blood thinners or antiplatelet agents. No history of neck surgery or previous radiation.   Past Medical History   Past Medical History:  Diagnosis Date   Developmental delay    mental age of 61, per mother   Elevated LFTs    Gait abnormality 04/07/2018   History of stroke    age 61   Seizures ([place])    last seizure 09/2021    Past Surgical History   Past Surgical History:  Procedure Laterality Date   BRAIN SURGERY     finger biopsy Right    MINOR EXCISION OF ORAL LESION N/A 05/01/2014   Procedure: EXCISION LIP MASS;  Surgeon: Leta Baptist, MD;  Location: [place];  Service: ENT;  Laterality: N/A;    Social History   Social History   Tobacco Use   Smoking status: Never   Smokeless tobacco: Never  Vaping Use   Vaping Use: Never used  Substance  Use Topics   Alcohol use: No   Drug use: No    Medications   Prior to Admission medications   Medication Sig Start Date End Date Taking? Authorizing Provider  acetaminophen (TYLENOL) 500 MG tablet Take 1,000 mg by mouth every 6 (six) hours as needed for moderate pain or headache.   Yes [provider]  cloBAZam (ONFI) 10 MG tablet Take 1 tablet (10 mg total) by mouth at bedtime. 07/04/21  Yes Suzzanne Cloud, NP  ferrous sulfate (SLOW IRON) 160 (50 Fe) MG TBCR SR tablet Take 1 tablet by mouth daily.   Yes [provider]  levETIRAcetam (KEPPRA) 1000 MG tablet Take 2 tablets (2,000 mg total) by mouth 2 (two) times daily. 07/01/21  Yes Marcial Pacas, MD  Multiple Vitamin (MULTIVITAMIN) tablet Take 1 tablet by mouth daily.   Yes [provider]  topiramate (TOPAMAX) 100 MG tablet Take 1 tablet (100 mg total) by mouth 2 (two) times daily. 10/16/21 10/11/22 Yes Alric Ran, MD    Allergies   Allergies  Allergen Reactions   Sulfa Antibiotics Hives    Review of Systems  ROS  Neurologic Exam  Awake, alert, oriented Memory and concentration grossly intact Speech fluent, appropriate CN grossly intact Motor exam: Upper Extremities Deltoid Bicep Tricep Grip  Right 5/5 5/5 5/5 5/5  Left 5/5 5/5 5/5 5/5   Lower Extremities IP Quad PF DF EHL  Right 5/5 5/5 5/5  5/5 5/5  Left 5/5 5/5 5/5 5/5 5/5   Sensation grossly intact to LT   Impression  - 36 y.o. female with medically intractable epilepsy  Plan  - will proceed with placement of left VNS  I have reviewed the indications for the procedure as well as the details of the procedure and the expected postoperative course and recovery at length with the patient and her parents in the office. We have also reviewed in detail the risks, benefits, and alternatives to the procedure. All questions were answered and Vanessa Zuniga provided informed consent to proceed.  Vanessa Lose, MD [facility]

## 2021-12-10 NOTE — Transfer of Care (Signed)
Immediate Anesthesia Transfer of Care Note  Patient: Vanessa Zuniga  Procedure(s) Performed: VAGAL NERVE STIMULATOR IMPLANT (Left: Neck)  Patient Location: PACU  Anesthesia Type:General  Level of Consciousness: sedated  Airway & Oxygen Therapy: Patient Spontanous Breathing and Patient connected to face mask oxygen  Post-op Assessment: Report given to RN and Post -op Vital signs reviewed and stable  Post vital signs: Reviewed and stable  Last Vitals:  Vitals Value Taken Time  BP 106/67 12/10/21 1520  Temp 36.3 C 12/10/21 1520  Pulse 99 12/10/21 1524  Resp 15 12/10/21 1524  SpO2 100 % 12/10/21 1524  Vitals shown include unvalidated device data.  Last Pain:  Vitals:   12/10/21 1520  TempSrc:   PainSc: Asleep      Patients Stated Pain Goal: 0 (56/38/75 [id number])  Complications: No notable events documented.

## 2021-12-10 NOTE — Anesthesia Postprocedure Evaluation (Signed)
Anesthesia Post Note  Patient: Vanessa Zuniga  Procedure(s) Performed: VAGAL NERVE STIMULATOR IMPLANT (Left: Neck)     Patient location during evaluation: PACU Anesthesia Type: General Level of consciousness: awake and alert Pain management: pain level controlled Vital Signs Assessment: post-procedure vital signs reviewed and stable Respiratory status: spontaneous breathing, nonlabored ventilation and respiratory function stable Cardiovascular status: blood pressure returned to baseline and stable Postop Assessment: no apparent nausea or vomiting Anesthetic complications: no   No notable events documented.  Last Vitals:  Vitals:   12/10/21 1535 12/10/21 1550  BP: 104/63 115/72  Pulse: 92 92  Resp: 15 13  Temp:  (!) 36.3 C  SpO2: 100% 100%    Last Pain:  Vitals:   12/10/21 1550  TempSrc:   PainSc: 0-No pain                 Audry Pili

## 2021-12-11 ENCOUNTER — Encounter (HOSPITAL_COMMUNITY): Payer: Self-pay | Admitting: Neurosurgery

## 2021-12-11 NOTE — Telephone Encounter (Signed)
Pt;s mother, Jone Baseman; pt just got VNS 12/10/21. Pt need an appt in 2 weeks from 11/28 for VNS can be turned on. Would like a call back if have an appt available.

## 2021-12-12 NOTE — Telephone Encounter (Signed)
error 

## 2021-12-12 NOTE — Telephone Encounter (Signed)
I have added to the schedule.

## 2021-12-12 NOTE — Telephone Encounter (Signed)
Called pt mother. She accepted 12/12 @ 11:45 am.

## 2021-12-24 ENCOUNTER — Encounter: Payer: Self-pay | Admitting: Neurology

## 2021-12-24 ENCOUNTER — Ambulatory Visit (INDEPENDENT_AMBULATORY_CARE_PROVIDER_SITE_OTHER): Payer: Medicaid Other | Admitting: Neurology

## 2021-12-24 VITALS — BP 128/76 | HR 88 | Ht 64.0 in | Wt 178.0 lb

## 2021-12-24 DIAGNOSIS — G40019 Localization-related (focal) (partial) idiopathic epilepsy and epileptic syndromes with seizures of localized onset, intractable, without status epilepticus: Secondary | ICD-10-CM

## 2021-12-24 DIAGNOSIS — Z9689 Presence of other specified functional implants: Secondary | ICD-10-CM | POA: Diagnosis not present

## 2021-12-24 DIAGNOSIS — Z8673 Personal history of transient ischemic attack (TIA), and cerebral infarction without residual deficits: Secondary | ICD-10-CM

## 2021-12-24 NOTE — Progress Notes (Signed)
Patient: Vanessa Zuniga Date of Birth: December 08, 1985  Reason for Visit: Follow up for seizures History from: Patient, mother Primary Neurologist: Dr. April Zuniga  ASSESSMENT AND PLAN 36 y.o. year old female   1.  Cerebrovascular disease, right hemiparesis 2.  Intractable focal epilepsy 3.  Elevated alkaline phosphatase, AST, ALT 4. S/P VNS placement   -Continue with higher dose Keppra 2000 mg twice a day,  Onfi 10 mg at bedtime and Topamax 100 mg BID.  last seizure was 09/21/21 -EEG was normal in May 2023 -She is sp VNS placement, initial setting turn on today, tolerated procedure well. Once we reach therapeutic dose and no additional seizure, we start medication tapering -Follow-up in 4 weeks, for additional VNS settings changes    No orders of the defined types were placed in this encounter.   HISTORY OF PRESENT ILLNESS: Update 05/07/21 [name] here today for follow-up. Seeing GI, had liver biopsy, was unremarkable. Had LFT 04/23/21 Alk phos 354, AST 47, ALT 69. Here today to discuss medications, on Keppra 750 mg twice daily, Lamictal 200/300 mg daily. No seizures since Dec 2021. Liver biopsy was unremarkable. GI suggesting come off Lamictal? She feels fine, denies any problem or concerns. In past couldn't tolerate Vimpat. Was on Carbamazepine and Lamictal long term for seizures, d/c carbamazepine 2021 due to elevated alk phos long term.  Trend recap: -September 2021 alkaline phosphatase 320, AST 27, ALT 39, weaned off carbamazepine, continue Lamictal 300 mg twice daily, Vimpat was added -Seizure December 2021 on Vimpat 100 mg AM/150 MG PM and Lamictal 300 mg twice daily, alkaline phosphatase was 253, ALT 48, AST 35, had side effect of Vimpat, Keppra was added 750 mg twice daily -I saw her in March 2022, alkaline phosphatase was 305, AST 43, ALT 55 on Keppra 750 mg twice daily, Lamictal 300 mg twice daily, Lamictal level was 21.4 -Saw Dr. Jannifer Zuniga in September 2022, alkaline phosphatase was  361, AST 42, ALT 46, on Keppra 750 mg twice daily, Lamictal 300 mg twice daily, was referred to GI, Lamictal level was reduced 200/300 mg daily since Lamictal level was 29.4  July 04, 2021 SS: EEG was normal 05/14/21, last office visit 05/07/21 discontinuing Lamictal, increase Keppra 1500 mg twice a day. On 06/28/21 were out of town, in the morning, sitting on bed, told mom her stomach hurt, she had seizure 5 minutes later, was tonic clonic, she fell in between the bed and nightstand, bruised her back and arm. Was making gurgling noises. Had about 4 minutes of shaking, called EMS, was post-ictal, agitated. Wasn't transported. Has been 20 years since this kind of seizure. No incontinence, she did bite her tongue.   October 16 2021: AC:  Patient presents today for follow-up, she is accompanied by her mother.  They reported the seizures have not been well controlled June 16 she has a generalized tonic-clonic seizure.  Onfi was added.  She had another seizure on September 9 again described as generalized tonic seizure.  At that time we added Topamax.  Currently she is on Keppra 2000 mg twice daily, Onfi 10 mg at night and Topamax 100 mg twice daily.  She does report side effect of slow thought processing, difficulty with concentrating difficulty with basic math problems. They have not discussed VNS therapy previously.   December 24 2021:  Patient presents today for follow-up, she is accompanied by her mother.  She is status post VNS placement.  Tolerated the procedure very well, she did follow-up with  Dr. Kathyrn Zuniga and was told that everything is normal.  Overall she is doing well.  Denies any seizures since last visit on October 4.  She is compliant with the medications and no side effects.   REVIEW OF SYSTEMS: Out of a complete 14 system review of symptoms, the patient complains only of the following symptoms, and all other reviewed systems are negative.  See HPI  ALLERGIES: Allergies  Allergen Reactions    Sulfa Antibiotics Hives    HOME MEDICATIONS: Outpatient Medications Prior to Visit  Medication Sig Dispense Refill   acetaminophen (TYLENOL) 500 MG tablet Take 1,000 mg by mouth every 6 (six) hours as needed for moderate pain or headache.     cloBAZam (ONFI) 10 MG tablet Take 1 tablet (10 mg total) by mouth at bedtime. 30 tablet 5   ferrous sulfate (SLOW IRON) 160 (50 Fe) MG TBCR SR tablet Take 1 tablet by mouth daily.     levETIRAcetam (KEPPRA) 1000 MG tablet Take 2 tablets (2,000 mg total) by mouth 2 (two) times daily. 120 tablet 11   Multiple Vitamin (MULTIVITAMIN) tablet Take 1 tablet by mouth daily.     topiramate (TOPAMAX) 100 MG tablet Take 1 tablet (100 mg total) by mouth 2 (two) times daily. 60 tablet 11   No facility-administered medications prior to visit.    PAST MEDICAL HISTORY: Past Medical History:  Diagnosis Date   Developmental delay    mental age of 23, per mother   Elevated LFTs    Gait abnormality 04/07/2018   History of stroke    age 77   Seizures ([place])    last seizure 09/2021    PAST SURGICAL HISTORY: Past Surgical History:  Procedure Laterality Date   BRAIN SURGERY     finger biopsy Right    MINOR EXCISION OF ORAL LESION N/A 05/01/2014   Procedure: EXCISION LIP MASS;  Surgeon: Vanessa Baptist, MD;  Location: [place];  Service: ENT;  Laterality: N/A;   VAGUS NERVE STIMULATOR INSERTION Left 12/10/2021   Procedure: VAGAL NERVE STIMULATOR IMPLANT;  Surgeon: Vanessa Lose, MD;  Location: [place];  Service: Neurosurgery;  Laterality: Left;  3C    FAMILY HISTORY: Family History  Problem Relation Age of Onset   Colon polyps Mother    Asthma Maternal Grandmother    Diabetes Maternal Grandmother    Heart disease Maternal Grandfather    Lung cancer Paternal Grandmother    Colon cancer Maternal Aunt     SOCIAL HISTORY: Social History   Socioeconomic History   Marital status: Single    Spouse name: Not on file   Number of children: 0    Years of education: 12   Highest education level: Not on file  Occupational History   Not on file  Tobacco Use   Smoking status: Never   Smokeless tobacco: Never  Vaping Use   Vaping Use: Never used  Substance and Sexual Activity   Alcohol use: No   Drug use: No   Sexual activity: Never    Birth control/protection: None  Other Topics Concern   Not on file  Social History Narrative   Patient lives with mother [name].   Patient has no children.    Patient as a high school education.   Patient is working.    Patient is single   Patient is left handed.   Patient does not drink caffeine.   Social Determinants of Health   Financial Resource Strain: Low Risk  (05/17/2020)  Overall Financial Resource Strain (CARDIA)    Difficulty of Paying Living Expenses: Not hard at all  Food Insecurity: No Food Insecurity (05/17/2020)   Hunger Vital Sign    Worried About Running Out of Food in the Last Year: Never true    Ran Out of Food in the Last Year: Never true  Transportation Needs: No Transportation Needs (05/17/2020)   PRAPARE - Hydrologist (Medical): No    Lack of Transportation (Non-Medical): No  Physical Activity: Insufficiently Active (05/17/2020)   Exercise Vital Sign    Days of Exercise per Week: 2 days    Minutes of Exercise per Session: 20 min  Stress: No Stress Concern Present (05/17/2020)   [place]    Feeling of Stress : Not at all  Social Connections: Moderately Integrated (05/17/2020)   Social Connection and Isolation Panel [NHANES]    Frequency of Communication with Friends and Family: More than three times a week    Frequency of Social Gatherings with Friends and Family: Twice a week    Attends Religious Services: More than 4 times per year    Active Member of [employer] or Organizations: Yes    Attends Archivist Meetings: More than 4 times per year    Marital Status:  Never married  Intimate Partner Violence: Not At Risk (05/17/2020)   Humiliation, Afraid, Rape, and Kick questionnaire    Fear of Current or Ex-Partner: No    Emotionally Abused: No    Physically Abused: No    Sexually Abused: No    PHYSICAL EXAM  Vitals:   12/24/21 1132  BP: 128/76  Pulse: 88  Weight: 178 lb (80.7 kg)  Height: _0  (1.626 m)    Body mass index is 30.55 kg/m.  Generalized: Well developed, in no acute distress  Neurological examination  Mentation: Alert oriented to time, place, history is mostly provided by her mother. Follows all commands speech and language fluent. Mild psychomotor retardation.  Cranial nerve II-XII: Pupils were equal round reactive to light. Extraocular movements were full, visual field were full on confrontational test. Facial sensation and strength were normal. Head turning and shoulder shrug  were normal and symmetric.No oral injury noted. Motor: Good strength all extremities, slight weakness to right upper extremity, flexion of the right fingers/arm Sensory: Sensory testing is intact to soft touch on all 4 extremities. No evidence of extinction is noted.  Coordination: Cerebellar testing reveals good finger-nose-finger and heel-to-shin bilaterally.  Gait and station: Gait is slightly wide-based, slight limp on the right Reflexes: Deep tendon reflexes are symmetric but slightly increased on the right  DIAGNOSTIC DATA (LABS, IMAGING, TESTING) - I reviewed patient records, labs, notes, testing and imaging myself where available.  Lab Results  Component Value Date   WBC 9.3 12/04/2021   HGB 12.4 12/04/2021   HCT 38.2 12/04/2021   MCV 85.5 12/04/2021   PLT 405 (H) 12/04/2021      Component Value Date/Time   NA 138 12/04/2021 1028   NA 138 07/04/2021 1449   K 4.0 12/04/2021 1028   CL 107 12/04/2021 1028   CO2 20 (L) 12/04/2021 1028   GLUCOSE 92 12/04/2021 1028   BUN 14 12/04/2021 1028   BUN 8 07/04/2021 1449   CREATININE 0.85  12/04/2021 1028   CALCIUM 9.9 12/04/2021 1028   PROT 7.7 11/13/2021 1516   PROT 7.1 09/03/2021 1110   ALBUMIN 4.3 11/13/2021 1516  ALBUMIN 4.3 09/03/2021 1110   AST 46 (H) 11/13/2021 1516   ALT 63 (H) 11/13/2021 1516   ALKPHOS 210 (H) 11/13/2021 1516   BILITOT 0.2 11/13/2021 1516   BILITOT <0.2 09/03/2021 1110   GFRNONAA >60 12/04/2021 1028   GFRAA 116 10/11/2019 1516   No results found for: "CHOL", "HDL", "LDLCALC", "LDLDIRECT", "TRIG", "CHOLHDL" No results found for: "HGBA1C" No results found for: "VITAMINB12" No results found for: "TSH"  I have spent a total of 30 minutes dedicated to this patient today, preparing to see patient, performing a medically appropriate examination and evaluation, ordering tests and/or medications and procedures, and counseling and educating the patient/family/caregiver; independently interpreting result and communicating results to the family/patient/caregiver; and documenting clinical information in the electronic medical record.   Alric Ran, MD 12/24/2021, 12:59 PM [facility] [address] [place], [place] [id number] [phone]

## 2021-12-25 ENCOUNTER — Ambulatory Visit (INDEPENDENT_AMBULATORY_CARE_PROVIDER_SITE_OTHER): Payer: Medicaid Other | Admitting: Nurse Practitioner

## 2021-12-25 ENCOUNTER — Encounter: Payer: Self-pay | Admitting: Nurse Practitioner

## 2021-12-25 VITALS — BP 116/60 | HR 94 | Ht 64.0 in | Wt 179.4 lb

## 2021-12-25 DIAGNOSIS — R932 Abnormal findings on diagnostic imaging of liver and biliary tract: Secondary | ICD-10-CM

## 2021-12-25 DIAGNOSIS — R748 Abnormal levels of other serum enzymes: Secondary | ICD-10-CM | POA: Diagnosis not present

## 2021-12-25 NOTE — Progress Notes (Signed)
Assessment    Patient profile:  Vanessa Zuniga is a 36 y.o. female known to Dr. Ardis Hughs with a past medical history of epilepsy, CVA, developmental delay. See PMH /PSH for additional history  # 36 yo female with chronically elevated liver chemistries of unclear etiology. Hepatic serologic workup for etiologies was negative. Liver biopsy showing non-specific inflammation with  differential diagnosis mainly including drug-induced injury ( Rx and OTC) or systemic illnesses causing non-specific reactive hepatitis. Due to concern for drug induced liver injury, Lamictal was discontinued several months ago. While downward trend of Alk phos is noted,  it seems there would have been more improvement by now.  AST and ALT remain stable at 2x ULN or less.   # Liver lesion. After completion of visit today I saw that we recommended a follow MRI of a liver lesion seen on MRI in Dec 2022. There was a "1.2 cm hypervascular mass in the posterior right hepatic lobe likely representing benign focal nodular hyperplasia, with adenoma considered less likely".   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 She will need follow up abdomen MRI without and with Eovist.  After this visit I called patent's mother to discuss the MRI findings from Dec 2022 ( noticed after our visit).  I will ask my nurse Mickel Baas to schedule patient for follow up MRI of abdomen without and with Eovist for follow up on liver lesion.   HPI    Chief complaint: follow up on abnormal liver tests    Kimblery has been followed here for abnormal liver chemistries.   11/09/21 office visit New patient visit. Liver enzymes had been chronically elevated at 2x ULN or less. Alk phos was rising progressively. It was thought that one of her seizure medications could may be driving the abnormal liver chemistries. Hepatic serologic workup and US obtained. At some point  afterwards an MRI was ordered.   01/23/21 office visit  Hepatic serologic workup negative. [place] negative.   MRI liver with and without contrast December 2022 showed a "1.2 cm hypervascular mass in the posterior right hepatic lobe, likely representing focal nodular hyperplasia, with adenoma consider less likely.  Arranged for a liver biopsy which showed mild nonspecific changes including patchy mild portal and lobular  inflammation, mild sinusoidal dilatation and multiple ceroid-laden Kupffer cells and macrophages.   We sent a message to Neurology about possibly giving her something other than lamictal   Interval history:  Alejandrina was weaned off Lamictal during which time, according to her mother, the seizures became uncontrolled after being controlled nicely for years. She has been off lamictal for months. Alk phos was very slowly trended down, liver enzymes remain minimally elevated. The stable liver chemistries are also in the face of a keppra dose increase and initiation of Onfi a couple of months ago. She recently underwent placement of a VNS. Hopefully at some point she will require less seizure medication. Damiah feels fine. No GI complaints. She has had some brain fog from seizure meds   Takes a daily MV, tylenol as needed (seldom), Keppra , topamax, onfi ( started around November) and iron tablet.    Previous GI Evaluation   Dec 2022 MRI MRI liver with and without contrast December 2022 showed a "1.2 cm hypervascular mass in the posterior right hepatic lobe, likely representing focal nodular hyperplasia, with adenoma consider less likely  02/06/21 Liver  biopsy A. LIVER, RIGHT LOBE, NEEDLE CORE BIOPSY:  - Mild nonspecific changes including patchy mild portal and lobular  inflammation, mild sinusoidal dilatation and multiple ceroid-laden  Kupffer cells and macrophages. See comment  - Negative for pathologic fibrosis.    Labs:     Latest Ref Rng & Units 12/04/2021   10:28 AM 09/21/2021     3:02 PM 07/04/2021    2:49 PM  CBC  WBC 4.0 - 10.5 K/uL 9.3  12.9  8.3   Hemoglobin 12.0 - 15.0 g/dL 12.4  13.5  11.9   Hematocrit 36.0 - 46.0 % 38.2  41.7  36.3   Platelets 150 - 400 K/uL 405  220  359        Latest Ref Rng & Units 11/13/2021    3:16 PM 09/21/2021    3:02 PM 09/03/2021   11:10 AM  Hepatic Function  Total Protein 6.0 - 8.3 g/dL 7.7  8.4  7.1   Albumin 3.5 - 5.2 g/dL 4.3  4.2  4.3   AST 0 - 37 U/L 46  59  42   ALT 0 - 35 U/L 63  66  45   Alk Phosphatase 39 - 117 U/L 210  245  286   Total Bilirubin 0.2 - 1.2 mg/dL 0.2  0.6  <0.2   Bilirubin, Direct 0.0 - 0.3 mg/dL 0.0   <0.10      Past Medical History:  Diagnosis Date   Developmental delay    mental age of 24, per mother   Elevated LFTs    Gait abnormality 04/07/2018   History of stroke    age 15   Seizures ([place])    last seizure 09/2021    Past Surgical History:  Procedure Laterality Date   BRAIN SURGERY     finger biopsy Right    MINOR EXCISION OF ORAL LESION N/A 05/01/2014   Procedure: EXCISION LIP MASS;  Surgeon: Leta Baptist, MD;  Location: [facility];  Service: ENT;  Laterality: N/A;   VAGUS NERVE STIMULATOR INSERTION Left 12/10/2021   Procedure: VAGAL NERVE STIMULATOR IMPLANT;  Surgeon: Consuella Lose, MD;  Location: [facility];  Service: Neurosurgery;  Laterality: Left;  3C    Current Medications, Allergies, Family History and Social History were reviewed in [employer] record.     Current Outpatient Medications  Medication Sig Dispense Refill   acetaminophen (TYLENOL) 500 MG tablet Take 1,000 mg by mouth every 6 (six) hours as needed for moderate pain or headache.     cloBAZam (ONFI) 10 MG tablet Take 1 tablet (10 mg total) by mouth at bedtime. 30 tablet 5   ferrous sulfate (SLOW IRON) 160 (50 Fe) MG TBCR SR tablet Take 1 tablet by mouth daily.     levETIRAcetam (KEPPRA) 1000 MG tablet Take 2 tablets (2,000 mg total) by mouth 2 (two) times daily. 120 tablet 11    Multiple Vitamin (MULTIVITAMIN) tablet Take 1 tablet by mouth daily.     topiramate (TOPAMAX) 100 MG tablet Take 1 tablet (100 mg total) by mouth 2 (two) times daily. 60 tablet 11   No current facility-administered medications for this visit.    Review of Systems: No chest pain. No shortness of breath. No urinary complaints.    Physical Exam  Wt Readings from Last 3 Encounters:  12/25/21 179 lb 6 oz (81.4 kg)  12/24/21 178 lb (80.7 kg)  12/10/21 178 lb (80.7 kg)    BP 116/60   Pulse 94  Ht _0  (1.626 m)   Wt 179 lb 6 oz (81.4 kg)   LMP 11/26/2021   BMI 30.79 kg/m  Constitutional:  Generally well appearing female in no acute distress. Psychiatric: Pleasant. Normal mood and affect. Behavior is normal. EENT: Pupils normal.  Conjunctivae are normal. No scleral icterus. Neck supple.  Cardiovascular: Normal rate, regular rhythm.  Pulmonary/chest: Effort normal and breath sounds normal. No wheezing, rales or rhonchi. Abdominal: Soft, nondistended, nontender. Bowel sounds active throughout. There are no masses palpable. No hepatomegaly. Neurological: Alert and oriented to person place and time. Musculoskeletal: No edema Skin: Skin is warm and dry. No rashes noted.  [name], NP  12/25/2021, 3:20 PM

## 2021-12-25 NOTE — Patient Instructions (Addendum)
Follow up with [place] as needed. We will wait to hear what [facility] determines as cause of your liver test abnormalities.    If you are age 37 or younger, your body mass index should be between 19-25. Your Body mass index is 30.79 kg/m. If this is out of the aformentioned range listed, please consider follow up with your Primary Care Provider.   __________________________________________________________  The [facility] providers would like to encourage you to use [facility] to communicate with providers for non-urgent requests or questions.  Due to long hold times on the telephone, sending your provider a message by [facility] may be a faster and more efficient way to get a response.  Please allow 48 business hours for a response.  Please remember that this is for non-urgent requests.   Due to recent changes in healthcare laws, you may see the results of your imaging and laboratory studies on MyChart before your provider has had a chance to review them.  We understand that in some cases there may be results that are confusing or concerning to you. Not all laboratory results come back in the same time frame and the provider may be waiting for multiple results in order to interpret others.  Please give [place] 48 hours in order for your provider to thoroughly review all the results before contacting the office for clarification of your results.    Thank you for choosing me and [facility].  [name], NP-C

## 2021-12-26 ENCOUNTER — Other Ambulatory Visit: Payer: Self-pay | Admitting: Neurology

## 2021-12-26 NOTE — Telephone Encounter (Signed)
Last filled on 11/28/2021 Clobazam 10 Mg Tablet 30 for 30 days   Last OV 12/24/21

## 2021-12-26 NOTE — Progress Notes (Signed)
I agree with the assessment and plan as outlined by Ms. Guenther. 

## 2022-01-09 ENCOUNTER — Ambulatory Visit: Payer: Medicaid Other | Admitting: Neurology

## 2022-01-17 IMAGING — CT CT HEAD W/O CM
3 series · 15 of 47 positions shown, 18 images · non-contrast
Comparison: Previous brain MRI from 03/22/2007.

CLINICAL DATA: Initial evaluation for acute dizziness. Bilateral
weakness.

EXAM:
CT HEAD WITHOUT CONTRAST
TECHNIQUE: Contiguous axial images were obtained from the base of the skull
through the vertex without intravenous contrast.

[Series 2: head w o · axial · 0.45mm/px · z∈[+10,+135]mm · 9 of 31 slices shown, 12 images]
[im 3/31  brain]
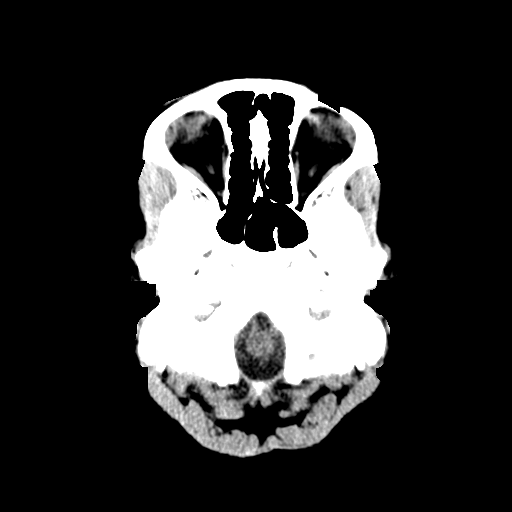
[im 3/31  bone]
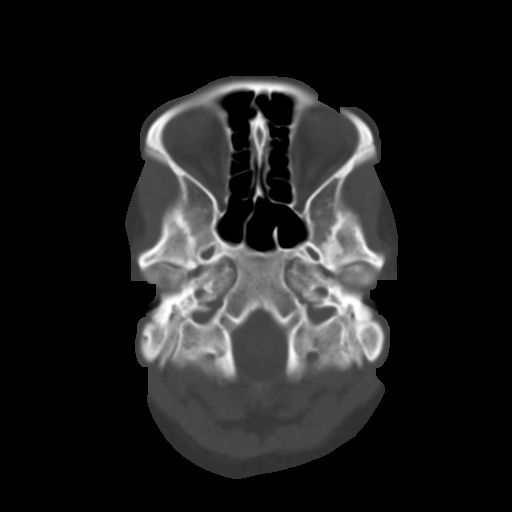
[im 6/31  brain]
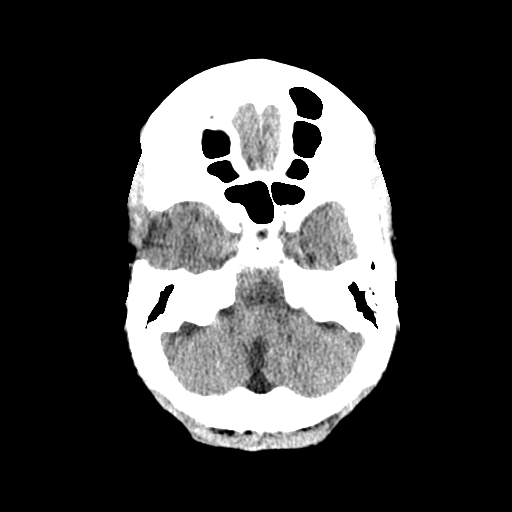
[im 9/31  brain]
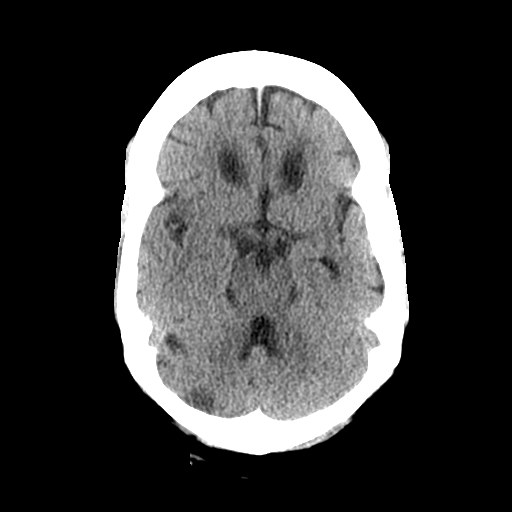
[im 12/31  brain]
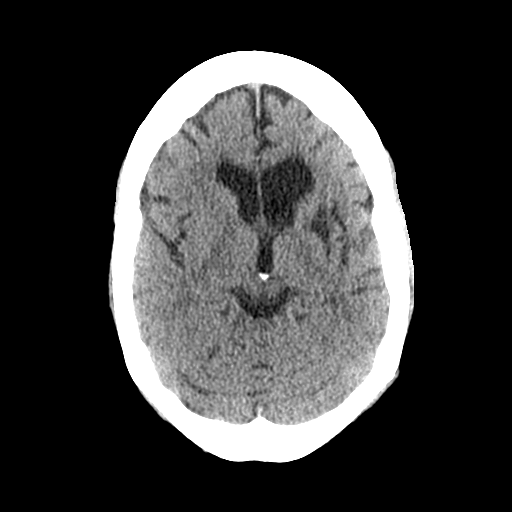
[im 16/31  brain]
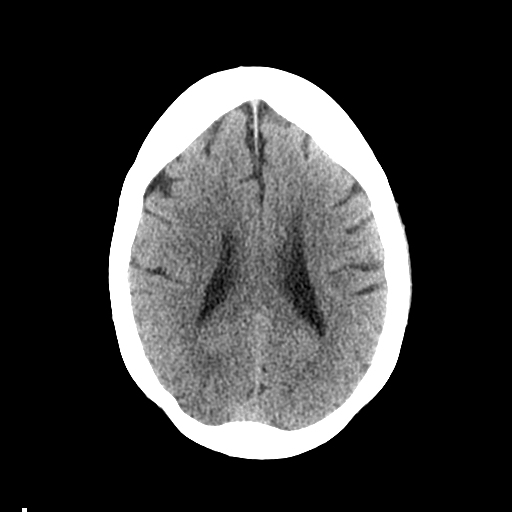
[im 16/31  bone]
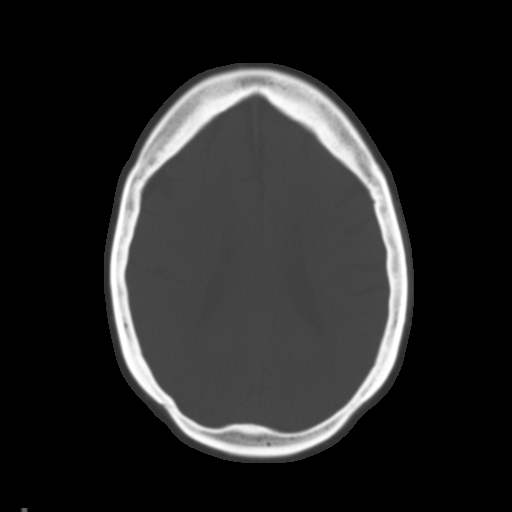
[im 19/31  brain]
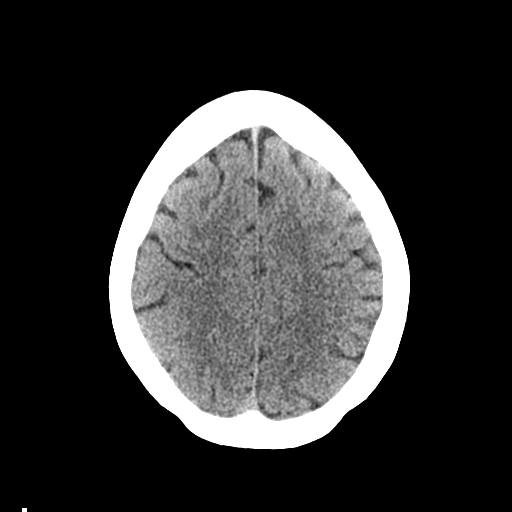
[im 22/31  brain]
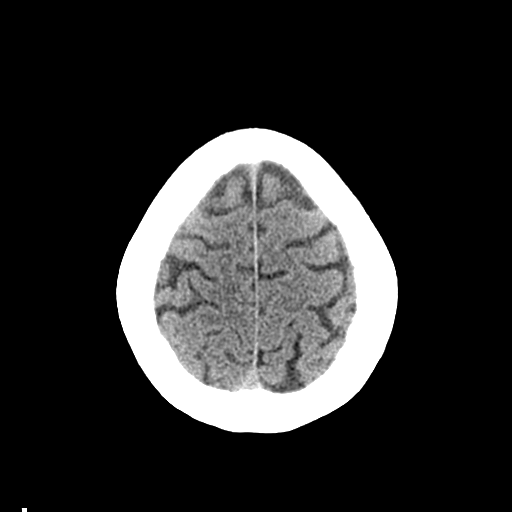
[im 25/31  brain]
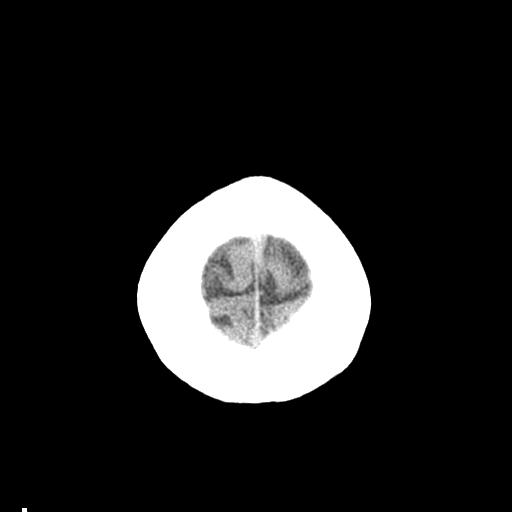
[im 28/31  brain]
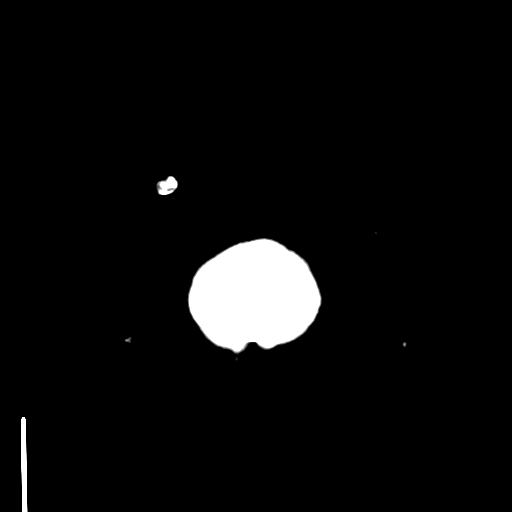
[im 28/31  bone]
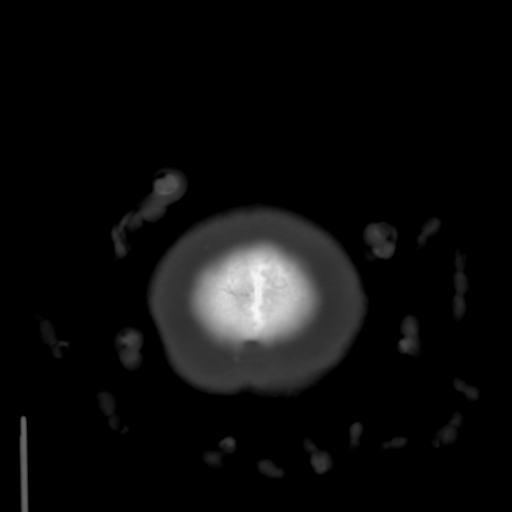

[Series 4: coronal soft · coronal · 0.32mm/px · 3 of 66 slices shown]
[im 22/66  brain]
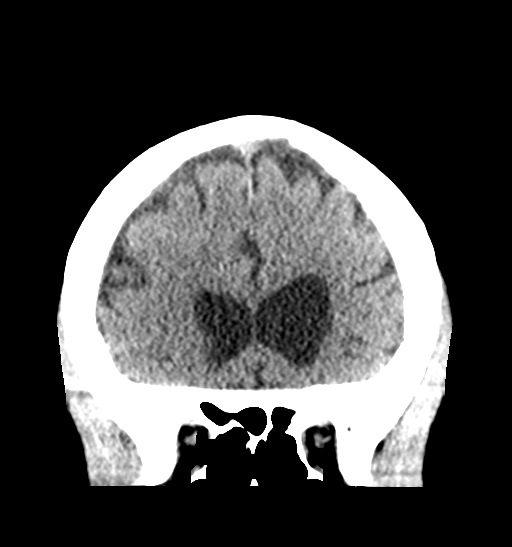
[im 29/66  brain]
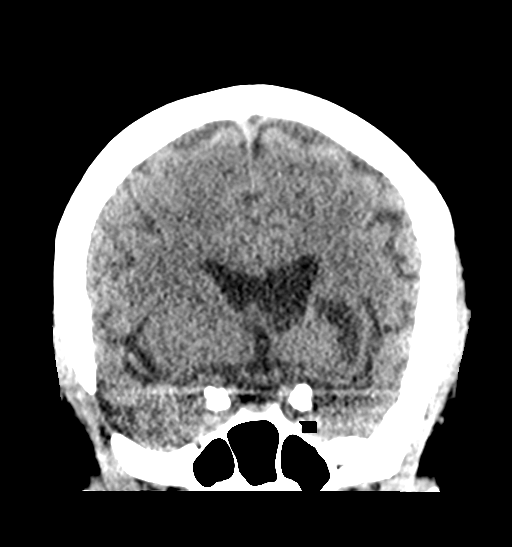
[im 37/66  brain]
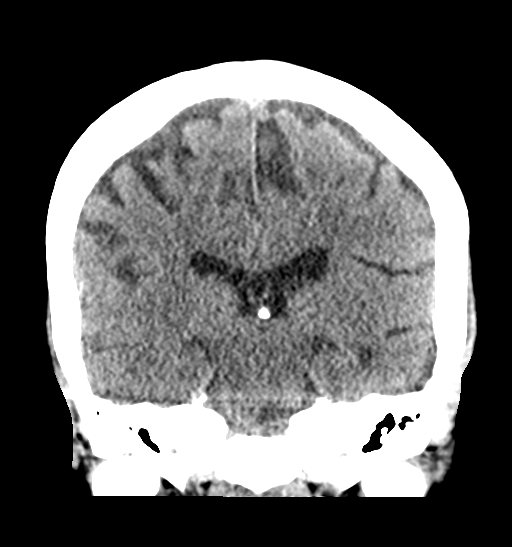

[Series 5: sagittal soft · sagittal · 0.33mm/px · 3 of 52 slices shown]
[im 18/52  brain]
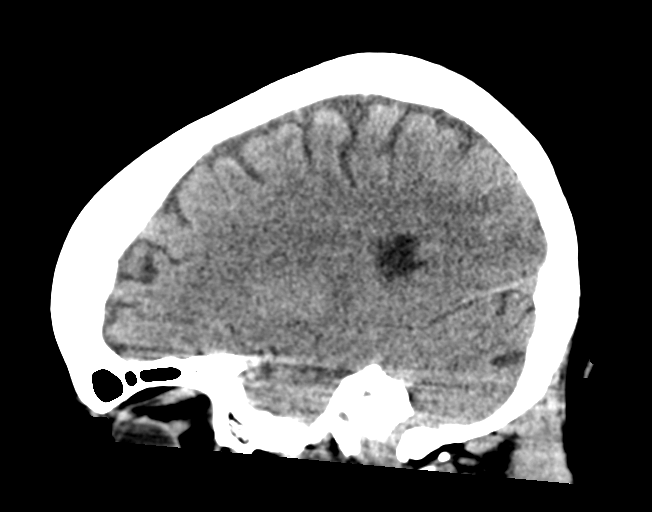
[im 26/52  brain]
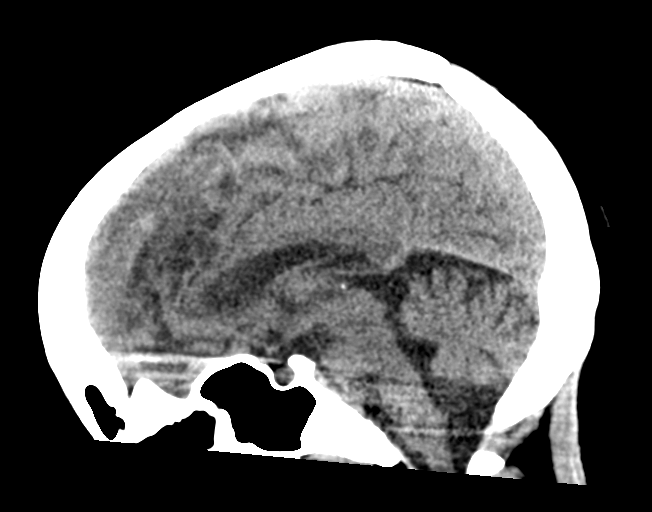
[im 35/52  brain]
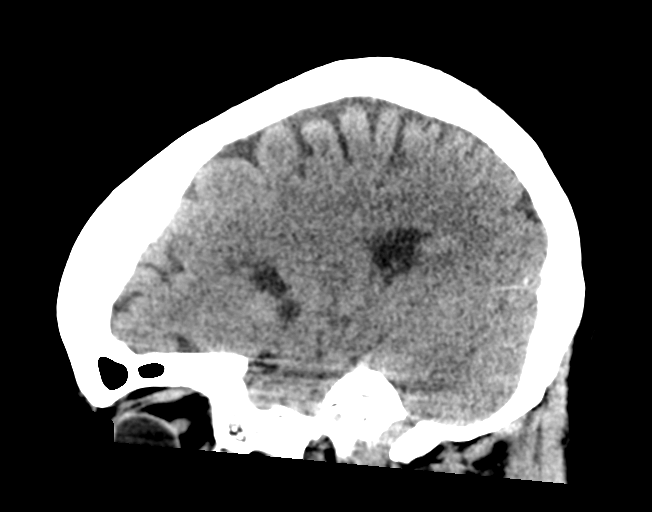

[15 of 47 positions shown; findings below may reference images not displayed]

FINDINGS: Brain: Mild atrophy for patient age. Remote infarct involving the
left basal ganglia is seen. No acute intracranial hemorrhage. No
acute large vessel territory infarct. No mass lesion, midline shift
or mass effect. No hydrocephalus or extra-axial fluid collection.

Vascular: No hyperdense vessel.

Skull: Scalp soft tissues demonstrate no acute finding. Previous
right temporal craniectomy.

Sinuses/Orbits: Visualized globes and orbital soft tissues
demonstrate no acute finding. Visualized paranasal sinuses are
clear. Mastoid air cells and middle ear cavities are well
pneumatized and free of fluid.

Other: None.
IMPRESSION: 1. No acute intracranial abnormality.
2. Mild cerebral atrophy for age with remote left basal ganglia
infarct.
3. Previous right temporal craniectomy.

## 2022-01-20 ENCOUNTER — Encounter: Payer: Self-pay | Admitting: Neurology

## 2022-01-20 ENCOUNTER — Ambulatory Visit (INDEPENDENT_AMBULATORY_CARE_PROVIDER_SITE_OTHER): Payer: Medicaid Other | Admitting: Neurology

## 2022-01-20 VITALS — BP 124/72 | HR 80 | Ht 64.6 in | Wt 177.0 lb

## 2022-01-20 DIAGNOSIS — Z9689 Presence of other specified functional implants: Secondary | ICD-10-CM | POA: Diagnosis not present

## 2022-01-20 DIAGNOSIS — Z8673 Personal history of transient ischemic attack (TIA), and cerebral infarction without residual deficits: Secondary | ICD-10-CM | POA: Diagnosis not present

## 2022-01-20 DIAGNOSIS — G40019 Localization-related (focal) (partial) idiopathic epilepsy and epileptic syndromes with seizures of localized onset, intractable, without status epilepticus: Secondary | ICD-10-CM

## 2022-01-20 NOTE — Patient Instructions (Signed)
Continue current medications VNS settings changed today, tolerated procedure very well.   Patient will follow-up in 4 weeks for additional VNS changes.   Return sooner if worse

## 2022-01-20 NOTE — Progress Notes (Signed)
Patient: Vanessa Zuniga Date of Birth: 24-Jul-1985  Reason for Visit: Follow up for seizures History from: Patient, mother Primary Neurologist: Dr. April Manson   ASSESSMENT AND PLAN 37 y.o. year old female   1.  Cerebrovascular disease, right hemiparesis 2.  Intractable focal epilepsy 3.  Elevated alkaline phosphatase, AST, ALT 4. S/P VNS placement   -Continue with higher dose Keppra 2000 mg twice a day,  Onfi 10 mg at bedtime and Topamax 100 mg BID.  last seizure was 09/21/21 -EEG was normal in May 2023 -She is sp VNS placement, setting changes today, patient tolerated procedure very well.  -Follow-up in 4 weeks, for additional VNS settings changes    No orders of the defined types were placed in this encounter.   HISTORY OF PRESENT ILLNESS: Update 05/07/21 [name] here today for follow-up. Seeing GI, had liver biopsy, was unremarkable. Had LFT 04/23/21 Alk phos 354, AST 47, ALT 69. Here today to discuss medications, on Keppra 750 mg twice daily, Lamictal 200/300 mg daily. No seizures since Dec 2021. Liver biopsy was unremarkable. GI suggesting come off Lamictal? She feels fine, denies any problem or concerns. In past couldn't tolerate Vimpat. Was on Carbamazepine and Lamictal long term for seizures, d/c carbamazepine 2021 due to elevated alk phos long term.  Trend recap: -September 2021 alkaline phosphatase 320, AST 27, ALT 39, weaned off carbamazepine, continue Lamictal 300 mg twice daily, Vimpat was added -Seizure December 2021 on Vimpat 100 mg AM/150 MG PM and Lamictal 300 mg twice daily, alkaline phosphatase was 253, ALT 48, AST 35, had side effect of Vimpat, Keppra was added 750 mg twice daily -I saw her in March 2022, alkaline phosphatase was 305, AST 43, ALT 55 on Keppra 750 mg twice daily, Lamictal 300 mg twice daily, Lamictal level was 21.4 -Saw Dr. Jannifer Franklin in September 2022, alkaline phosphatase was 361, AST 42, ALT 46, on Keppra 750 mg twice daily, Lamictal 300 mg twice  daily, was referred to GI, Lamictal level was reduced 200/300 mg daily since Lamictal level was 29.4  July 04, 2021 SS: EEG was normal 05/14/21, last office visit 05/07/21 discontinuing Lamictal, increase Keppra 1500 mg twice a day. On 06/28/21 were out of town, in the morning, sitting on bed, told mom her stomach hurt, she had seizure 5 minutes later, was tonic clonic, she fell in between the bed and nightstand, bruised her back and arm. Was making gurgling noises. Had about 4 minutes of shaking, called EMS, was post-ictal, agitated. Wasn't transported. Has been 20 years since this kind of seizure. No incontinence, she did bite her tongue.   October 16 2021: AC:  Patient presents today for follow-up, she is accompanied by her mother.  They reported the seizures have not been well controlled June 16 she has a generalized tonic-clonic seizure.  Onfi was added.  She had another seizure on September 9 again described as generalized tonic seizure.  At that time we added Topamax.  Currently she is on Keppra 2000 mg twice daily, Onfi 10 mg at night and Topamax 100 mg twice daily.  She does report side effect of slow thought processing, difficulty with concentrating difficulty with basic math problems. They have not discussed VNS therapy previously.   December 24 2021:  Patient presents today for follow-up, she is accompanied by her mother.  She is status post VNS placement.  Tolerated the procedure very well, she did follow-up with Dr. Kathyrn Sheriff and was told that everything is normal.  Overall  she is doing well.  Denies any seizures since last visit on October 4.  She is compliant with the medications and no side effects.   January 20 2022:  Patient presents today for, she is accompanied by her parents. She is doing well in terms of seizures.  No seizures since VNS placement. She is also tolerating the VNS very well.  Stated that she self swipe her VNS at least 3 times a day.  Denies any hoarseness or any other  adverse reactions.  Overall she is doing well.  She is also compliant with her antiseizure medications.  Currently no major concerns   REVIEW OF SYSTEMS: Out of a complete 14 system review of symptoms, the patient complains only of the following symptoms, and all other reviewed systems are negative.  See HPI  ALLERGIES: Allergies  Allergen Reactions   Sulfa Antibiotics Hives    HOME MEDICATIONS: Outpatient Medications Prior to Visit  Medication Sig Dispense Refill   acetaminophen (TYLENOL) 500 MG tablet Take 1,000 mg by mouth every 6 (six) hours as needed for moderate pain or headache.     cloBAZam (ONFI) 10 MG tablet TAKE ONE TABLET ('10MG'$  TOTAL) BY MOUTH ATBEDTIME 30 tablet 1   ferrous sulfate (SLOW IRON) 160 (50 Fe) MG TBCR SR tablet Take 1 tablet by mouth daily.     levETIRAcetam (KEPPRA) 1000 MG tablet Take 2 tablets (2,000 mg total) by mouth 2 (two) times daily. 120 tablet 11   Multiple Vitamin (MULTIVITAMIN) tablet Take 1 tablet by mouth daily.     topiramate (TOPAMAX) 100 MG tablet Take 1 tablet (100 mg total) by mouth 2 (two) times daily. 60 tablet 11   No facility-administered medications prior to visit.    PAST MEDICAL HISTORY: Past Medical History:  Diagnosis Date   Developmental delay    mental age of 43, per mother   Elevated LFTs    Gait abnormality 04/07/2018   History of stroke    age 55   Seizures ([place])    last seizure 09/2021    PAST SURGICAL HISTORY: Past Surgical History:  Procedure Laterality Date   BRAIN SURGERY     finger biopsy Right    MINOR EXCISION OF ORAL LESION N/A 05/01/2014   Procedure: EXCISION LIP MASS;  Surgeon: Leta Baptist, MD;  Location: [facility];  Service: ENT;  Laterality: N/A;   VAGUS NERVE STIMULATOR INSERTION Left 12/10/2021   Procedure: VAGAL NERVE STIMULATOR IMPLANT;  Surgeon: Consuella Lose, MD;  Location: [facility];  Service: Neurosurgery;  Laterality: Left;  3C    FAMILY HISTORY: Family History  Problem  Relation Age of Onset   Colon polyps Mother    Asthma Maternal Grandmother    Diabetes Maternal Grandmother    Heart disease Maternal Grandfather    Lung cancer Paternal Grandmother    Colon cancer Maternal Aunt     SOCIAL HISTORY: Social History   Socioeconomic History   Marital status: Single    Spouse name: Not on file   Number of children: 0   Years of education: 12   Highest education level: Not on file  Occupational History   Not on file  Tobacco Use   Smoking status: Never   Smokeless tobacco: Never  Vaping Use   Vaping Use: Never used  Substance and Sexual Activity   Alcohol use: No   Drug use: No   Sexual activity: Never    Birth control/protection: None  Other Topics Concern   Not on  file  Social History Narrative   Patient lives with mother [name].   Patient has no children.    Patient as a high school education.   Patient is working.    Patient is single   Patient is left handed.   Patient does not drink caffeine.   Social Determinants of Health   Financial Resource Strain: Low Risk  (05/17/2020)   Overall Financial Resource Strain (CARDIA)    Difficulty of Paying Living Expenses: Not hard at all  Food Insecurity: No Food Insecurity (05/17/2020)   Hunger Vital Sign    Worried About Running Out of Food in the Last Year: Never true    Ran Out of Food in the Last Year: Never true  Transportation Needs: No Transportation Needs (05/17/2020)   PRAPARE - Hydrologist (Medical): No    Lack of Transportation (Non-Medical): No  Physical Activity: Insufficiently Active (05/17/2020)   Exercise Vital Sign    Days of Exercise per Week: 2 days    Minutes of Exercise per Session: 20 min  Stress: No Stress Concern Present (05/17/2020)   [place]    Feeling of Stress : Not at all  Social Connections: Moderately Integrated (05/17/2020)   Social Connection and Isolation Panel  [NHANES]    Frequency of Communication with Friends and Family: More than three times a week    Frequency of Social Gatherings with Friends and Family: Twice a week    Attends Religious Services: More than 4 times per year    Active Member of [employer] or Organizations: Yes    Attends Archivist Meetings: More than 4 times per year    Marital Status: Never married  Intimate Partner Violence: Not At Risk (05/17/2020)   Humiliation, Afraid, Rape, and Kick questionnaire    Fear of Current or Ex-Partner: No    Emotionally Abused: No    Physically Abused: No    Sexually Abused: No    PHYSICAL EXAM  Vitals:   01/20/22 1556  BP: 124/72  Pulse: 80  Weight: 177 lb (80.3 kg)  Height: 5' 4.6" (1.641 m)     Body mass index is 29.82 kg/m.  Generalized: Well developed, in no acute distress  Neurological examination  Mentation: Alert oriented to time, place, history is mostly provided by her mother. Follows all commands speech and language fluent. Mild psychomotor retardation.  Cranial nerve II-XII: Extraocular movements were full, visual field were full on confrontational test. Facial sensation and strength were normal. Head turning and shoulder shrug  were normal and symmetric.No oral injury noted. Motor: Good strength all extremities, slight weakness to right upper extremity, flexion of the right fingers/arm Sensory: Sensory testing is intact to soft touch on all 4 extremities. No evidence of extinction is noted.  Coordination: Cerebellar testing reveals good finger-nose-finger and heel-to-shin bilaterally.  Gait and station: Gait is slightly wide-based, slight limp on the right Reflexes: Deep tendon reflexes are symmetric but slightly increased on the right   DIAGNOSTIC DATA (LABS, IMAGING, TESTING) - I reviewed patient records, labs, notes, testing and imaging myself where available.  Lab Results  Component Value Date   WBC 9.3 12/04/2021   HGB 12.4 12/04/2021   HCT 38.2  12/04/2021   MCV 85.5 12/04/2021   PLT 405 (H) 12/04/2021      Component Value Date/Time   NA 138 12/04/2021 1028   NA 138 07/04/2021 1449   K 4.0 12/04/2021 1028  CL 107 12/04/2021 1028   CO2 20 (L) 12/04/2021 1028   GLUCOSE 92 12/04/2021 1028   BUN 14 12/04/2021 1028   BUN 8 07/04/2021 1449   CREATININE 0.85 12/04/2021 1028   CALCIUM 9.9 12/04/2021 1028   PROT 7.7 11/13/2021 1516   PROT 7.1 09/03/2021 1110   ALBUMIN 4.3 11/13/2021 1516   ALBUMIN 4.3 09/03/2021 1110   AST 46 (H) 11/13/2021 1516   ALT 63 (H) 11/13/2021 1516   ALKPHOS 210 (H) 11/13/2021 1516   BILITOT 0.2 11/13/2021 1516   BILITOT <0.2 09/03/2021 1110   GFRNONAA >60 12/04/2021 1028   GFRAA 116 10/11/2019 1516   No results found for: "CHOL", "HDL", "LDLCALC", "LDLDIRECT", "TRIG", "CHOLHDL" No results found for: "HGBA1C" No results found for: "VITAMINB12" No results found for: "TSH"  I have spent a total of 30 minutes dedicated to this patient today, preparing to see patient, performing a medically appropriate examination and evaluation, ordering tests and/or medications and procedures, and counseling and educating the patient/family/caregiver; independently interpreting result and communicating results to the family/patient/caregiver; and documenting clinical information in the electronic medical record.    Alric Ran, MD 01/20/2022, 5:13 PM [facility] [address], [place], [place] [address] [phone]

## 2022-02-13 ENCOUNTER — Other Ambulatory Visit: Payer: Self-pay | Admitting: Nurse Practitioner

## 2022-02-13 DIAGNOSIS — R748 Abnormal levels of other serum enzymes: Secondary | ICD-10-CM

## 2022-02-13 DIAGNOSIS — D376 Neoplasm of uncertain behavior of liver, gallbladder and bile ducts: Secondary | ICD-10-CM

## 2022-02-13 DIAGNOSIS — K739 Chronic hepatitis, unspecified: Secondary | ICD-10-CM

## 2022-02-17 ENCOUNTER — Encounter: Payer: Self-pay | Admitting: Neurology

## 2022-02-17 ENCOUNTER — Ambulatory Visit: Payer: Medicaid Other | Admitting: Neurology

## 2022-02-17 VITALS — BP 114/72 | HR 78 | Ht 64.0 in | Wt 181.5 lb

## 2022-02-17 DIAGNOSIS — G40019 Localization-related (focal) (partial) idiopathic epilepsy and epileptic syndromes with seizures of localized onset, intractable, without status epilepticus: Secondary | ICD-10-CM | POA: Diagnosis not present

## 2022-02-17 DIAGNOSIS — Z9689 Presence of other specified functional implants: Secondary | ICD-10-CM | POA: Diagnosis not present

## 2022-02-17 NOTE — Progress Notes (Signed)
Patient: Vanessa Zuniga Date of Birth: 24-Jul-1985  Reason for Visit: Follow up for seizures History from: Patient, mother Primary Neurologist: Dr. April Manson   ASSESSMENT AND PLAN 37 y.o. year old female   1.  Cerebrovascular disease, right hemiparesis 2.  Intractable focal epilepsy 3.  Elevated alkaline phosphatase, AST, ALT 4. S/P VNS placement   -Continue with higher dose Keppra 2000 mg twice a day,  Onfi 10 mg at bedtime and Topamax 100 mg BID.  last seizure was 09/21/21 -EEG was normal in May 2023 -She is sp VNS placement, setting changes today, patient tolerated procedure very well.  -Follow-up in 4 weeks, for additional VNS settings changes    No orders of the defined types were placed in this encounter.   HISTORY OF PRESENT ILLNESS: Update 05/07/21 [name] here today for follow-up. Seeing GI, had liver biopsy, was unremarkable. Had LFT 04/23/21 Alk phos 354, AST 47, ALT 69. Here today to discuss medications, on Keppra 750 mg twice daily, Lamictal 200/300 mg daily. No seizures since Dec 2021. Liver biopsy was unremarkable. GI suggesting come off Lamictal? She feels fine, denies any problem or concerns. In past couldn't tolerate Vimpat. Was on Carbamazepine and Lamictal long term for seizures, d/c carbamazepine 2021 due to elevated alk phos long term.  Trend recap: -September 2021 alkaline phosphatase 320, AST 27, ALT 39, weaned off carbamazepine, continue Lamictal 300 mg twice daily, Vimpat was added -Seizure December 2021 on Vimpat 100 mg AM/150 MG PM and Lamictal 300 mg twice daily, alkaline phosphatase was 253, ALT 48, AST 35, had side effect of Vimpat, Keppra was added 750 mg twice daily -I saw her in March 2022, alkaline phosphatase was 305, AST 43, ALT 55 on Keppra 750 mg twice daily, Lamictal 300 mg twice daily, Lamictal level was 21.4 -Saw Dr. Jannifer Franklin in September 2022, alkaline phosphatase was 361, AST 42, ALT 46, on Keppra 750 mg twice daily, Lamictal 300 mg twice  daily, was referred to GI, Lamictal level was reduced 200/300 mg daily since Lamictal level was 29.4  July 04, 2021 SS: EEG was normal 05/14/21, last office visit 05/07/21 discontinuing Lamictal, increase Keppra 1500 mg twice a day. On 06/28/21 were out of town, in the morning, sitting on bed, told mom her stomach hurt, she had seizure 5 minutes later, was tonic clonic, she fell in between the bed and nightstand, bruised her back and arm. Was making gurgling noises. Had about 4 minutes of shaking, called EMS, was post-ictal, agitated. Wasn't transported. Has been 20 years since this kind of seizure. No incontinence, she did bite her tongue.   October 16 2021: AC:  Patient presents today for follow-up, she is accompanied by her mother.  They reported the seizures have not been well controlled June 16 she has a generalized tonic-clonic seizure.  Onfi was added.  She had another seizure on September 9 again described as generalized tonic seizure.  At that time we added Topamax.  Currently she is on Keppra 2000 mg twice daily, Onfi 10 mg at night and Topamax 100 mg twice daily.  She does report side effect of slow thought processing, difficulty with concentrating difficulty with basic math problems. They have not discussed VNS therapy previously.   December 24 2021:  Patient presents today for follow-up, she is accompanied by her mother.  She is status post VNS placement.  Tolerated the procedure very well, she did follow-up with Dr. Kathyrn Sheriff and was told that everything is normal.  Overall  she is doing well.  Denies any seizures since last visit on October 4.  She is compliant with the medications and no side effects.   January 20 2022:  Patient presents today for, she is accompanied by her parents. She is doing well in terms of seizures.  No seizures since VNS placement. She is also tolerating the VNS very well.  Stated that she self swipe her VNS at least 3 times a day.  Denies any hoarseness or any other  adverse reactions.  Overall she is doing well.  She is also compliant with her antiseizure medications.  Currently no major concerns   February 17, 2022 Patient presents today for follow-up, she is accompanied by parents.  She is doing well, no seizure or seizure-like activity.  She is compliant with her Keppra, Topamax, and Onfi.  She continues to swipe her VNS at least 3 times a day.  Again no seizures and no additional side effects.  Overall doing well   REVIEW OF SYSTEMS: Out of a complete 14 system review of symptoms, the patient complains only of the following symptoms, and all other reviewed systems are negative.  See HPI  ALLERGIES: Allergies  Allergen Reactions   Sulfa Antibiotics Hives    HOME MEDICATIONS: Outpatient Medications Prior to Visit  Medication Sig Dispense Refill   acetaminophen (TYLENOL) 500 MG tablet Take 1,000 mg by mouth every 6 (six) hours as needed for moderate pain or headache.     cloBAZam (ONFI) 10 MG tablet TAKE ONE TABLET ('10MG'$  TOTAL) BY MOUTH ATBEDTIME 30 tablet 1   ferrous sulfate (SLOW IRON) 160 (50 Fe) MG TBCR SR tablet Take 1 tablet by mouth daily.     levETIRAcetam (KEPPRA) 1000 MG tablet Take 2 tablets (2,000 mg total) by mouth 2 (two) times daily. 120 tablet 11   Multiple Vitamin (MULTIVITAMIN) tablet Take 1 tablet by mouth daily.     topiramate (TOPAMAX) 100 MG tablet Take 1 tablet (100 mg total) by mouth 2 (two) times daily. 60 tablet 11   No facility-administered medications prior to visit.    PAST MEDICAL HISTORY: Past Medical History:  Diagnosis Date   Developmental delay    mental age of 14, per mother   Elevated LFTs    Gait abnormality 04/07/2018   History of stroke    age 72   Seizures ([place])    last seizure 09/2021    PAST SURGICAL HISTORY: Past Surgical History:  Procedure Laterality Date   BRAIN SURGERY     finger biopsy Right    MINOR EXCISION OF ORAL LESION N/A 05/01/2014   Procedure: EXCISION LIP MASS;  Surgeon: Leta Baptist, MD;  Location: [facility];  Service: ENT;  Laterality: N/A;   VAGUS NERVE STIMULATOR INSERTION Left 12/10/2021   Procedure: VAGAL NERVE STIMULATOR IMPLANT;  Surgeon: Consuella Lose, MD;  Location: [facility];  Service: Neurosurgery;  Laterality: Left;  3C    FAMILY HISTORY: Family History  Problem Relation Age of Onset   Colon polyps Mother    Asthma Maternal Grandmother    Diabetes Maternal Grandmother    Heart disease Maternal Grandfather    Lung cancer Paternal Grandmother    Colon cancer Maternal Aunt     SOCIAL HISTORY: Social History   Socioeconomic History   Marital status: Single    Spouse name: Not on file   Number of children: 0   Years of education: 12   Highest education level: Not on file  Occupational History  Not on file  Tobacco Use   Smoking status: Never   Smokeless tobacco: Never  Vaping Use   Vaping Use: Never used  Substance and Sexual Activity   Alcohol use: No   Drug use: No   Sexual activity: Never    Birth control/protection: None  Other Topics Concern   Not on file  Social History Narrative   Patient lives with mother [name].   Patient has no children.    Patient as a high school education.   Patient is working.    Patient is single   Patient is left handed.   Patient does not drink caffeine.   Social Determinants of Health   Financial Resource Strain: Low Risk  (05/17/2020)   Overall Financial Resource Strain (CARDIA)    Difficulty of Paying Living Expenses: Not hard at all  Food Insecurity: No Food Insecurity (05/17/2020)   Hunger Vital Sign    Worried About Running Out of Food in the Last Year: Never true    Ran Out of Food in the Last Year: Never true  Transportation Needs: No Transportation Needs (05/17/2020)   PRAPARE - Hydrologist (Medical): No    Lack of Transportation (Non-Medical): No  Physical Activity: Insufficiently Active (05/17/2020)   Exercise Vital Sign    Days of  Exercise per Week: 2 days    Minutes of Exercise per Session: 20 min  Stress: No Stress Concern Present (05/17/2020)   [place]    Feeling of Stress : Not at all  Social Connections: Moderately Integrated (05/17/2020)   Social Connection and Isolation Panel [NHANES]    Frequency of Communication with Friends and Family: More than three times a week    Frequency of Social Gatherings with Friends and Family: Twice a week    Attends Religious Services: More than 4 times per year    Active Member of [employer] or Organizations: Yes    Attends Archivist Meetings: More than 4 times per year    Marital Status: Never married  Intimate Partner Violence: Not At Risk (05/17/2020)   Humiliation, Afraid, Rape, and Kick questionnaire    Fear of Current or Ex-Partner: No    Emotionally Abused: No    Physically Abused: No    Sexually Abused: No    PHYSICAL EXAM  Vitals:   02/17/22 1551  BP: 114/72  Pulse: 78  Weight: 181 lb 8 oz (82.3 kg)  Height: '5\' 4"'$  (1.626 m)     Body mass index is 31.15 kg/m.  Generalized: Well developed, in no acute distress  Neurological examination  Mentation: Alert oriented to time, place, history is mostly provided by her mother. Follows all commands speech and language fluent. Mild psychomotor retardation.  Cranial nerve II-XII: Extraocular movements were full, visual field were full on confrontational test. Facial sensation and strength were normal. Head turning and shoulder shrug  were normal and symmetric.No oral injury noted. Motor: Good strength all extremities, slight weakness to right upper extremity, flexion of the right fingers/arm Sensory: Sensory testing is intact to soft touch on all 4 extremities. No evidence of extinction is noted.  Coordination: Cerebellar testing reveals good finger-nose-finger and heel-to-shin bilaterally.  Gait and station: Gait is slightly wide-based, slight  limp on the right Reflexes: Deep tendon reflexes are symmetric but slightly increased on the right   DIAGNOSTIC DATA (LABS, IMAGING, TESTING) - I reviewed patient records, labs, notes, testing and imaging myself  where available.  Lab Results  Component Value Date   WBC 9.3 12/04/2021   HGB 12.4 12/04/2021   HCT 38.2 12/04/2021   MCV 85.5 12/04/2021   PLT 405 (H) 12/04/2021      Component Value Date/Time   NA 138 12/04/2021 1028   NA 138 07/04/2021 1449   K 4.0 12/04/2021 1028   CL 107 12/04/2021 1028   CO2 20 (L) 12/04/2021 1028   GLUCOSE 92 12/04/2021 1028   BUN 14 12/04/2021 1028   BUN 8 07/04/2021 1449   CREATININE 0.85 12/04/2021 1028   CALCIUM 9.9 12/04/2021 1028   PROT 7.7 11/13/2021 1516   PROT 7.1 09/03/2021 1110   ALBUMIN 4.3 11/13/2021 1516   ALBUMIN 4.3 09/03/2021 1110   AST 46 (H) 11/13/2021 1516   ALT 63 (H) 11/13/2021 1516   ALKPHOS 210 (H) 11/13/2021 1516   BILITOT 0.2 11/13/2021 1516   BILITOT <0.2 09/03/2021 1110   GFRNONAA >60 12/04/2021 1028   GFRAA 116 10/11/2019 1516   No results found for: "CHOL", "HDL", "LDLCALC", "LDLDIRECT", "TRIG", "CHOLHDL" No results found for: "HGBA1C" No results found for: "VITAMINB12" No results found for: "TSH"    Alric Ran, MD 02/17/2022, 4:14 PM [facility] [address], [place], [place] [address] [phone]

## 2022-02-18 ENCOUNTER — Encounter: Payer: Self-pay | Admitting: Neurology

## 2022-02-24 ENCOUNTER — Other Ambulatory Visit: Payer: Self-pay | Admitting: Neurology

## 2022-03-10 ENCOUNTER — Ambulatory Visit: Payer: Medicaid Other | Admitting: Neurology

## 2022-03-10 ENCOUNTER — Encounter: Payer: Self-pay | Admitting: Neurology

## 2022-03-10 VITALS — BP 117/88 | HR 74 | Ht 64.0 in | Wt 181.0 lb

## 2022-03-10 DIAGNOSIS — Z9689 Presence of other specified functional implants: Secondary | ICD-10-CM | POA: Diagnosis not present

## 2022-03-10 DIAGNOSIS — G40019 Localization-related (focal) (partial) idiopathic epilepsy and epileptic syndromes with seizures of localized onset, intractable, without status epilepticus: Secondary | ICD-10-CM

## 2022-03-10 NOTE — Progress Notes (Signed)
Patient: Vanessa Zuniga Date of Birth: 24-Jul-1985  Reason for Visit: Follow up for seizures History from: Patient, mother Primary Neurologist: Dr. April Manson   ASSESSMENT AND PLAN 37 y.o. year old female   1.  Cerebrovascular disease, right hemiparesis 2.  Intractable focal epilepsy 3.  Elevated alkaline phosphatase, AST, ALT 4. S/P VNS placement   -Continue with higher dose Keppra 2000 mg twice a day,  Onfi 10 mg at bedtime and Topamax 100 mg BID.  last seizure was 09/21/21 -EEG was normal in May 2023 -She is sp VNS placement, setting changes today, patient tolerated procedure very well.  -Follow-up in 4 weeks, for additional VNS settings changes    No orders of the defined types were placed in this encounter.   HISTORY OF PRESENT ILLNESS: Update 05/07/21 [name] here today for follow-up. Seeing GI, had liver biopsy, was unremarkable. Had LFT 04/23/21 Alk phos 354, AST 47, ALT 69. Here today to discuss medications, on Keppra 750 mg twice daily, Lamictal 200/300 mg daily. No seizures since Dec 2021. Liver biopsy was unremarkable. GI suggesting come off Lamictal? She feels fine, denies any problem or concerns. In past couldn't tolerate Vimpat. Was on Carbamazepine and Lamictal long term for seizures, d/c carbamazepine 2021 due to elevated alk phos long term.  Trend recap: -September 2021 alkaline phosphatase 320, AST 27, ALT 39, weaned off carbamazepine, continue Lamictal 300 mg twice daily, Vimpat was added -Seizure December 2021 on Vimpat 100 mg AM/150 MG PM and Lamictal 300 mg twice daily, alkaline phosphatase was 253, ALT 48, AST 35, had side effect of Vimpat, Keppra was added 750 mg twice daily -I saw her in March 2022, alkaline phosphatase was 305, AST 43, ALT 55 on Keppra 750 mg twice daily, Lamictal 300 mg twice daily, Lamictal level was 21.4 -Saw Dr. Jannifer Franklin in September 2022, alkaline phosphatase was 361, AST 42, ALT 46, on Keppra 750 mg twice daily, Lamictal 300 mg twice  daily, was referred to GI, Lamictal level was reduced 200/300 mg daily since Lamictal level was 29.4  July 04, 2021 SS: EEG was normal 05/14/21, last office visit 05/07/21 discontinuing Lamictal, increase Keppra 1500 mg twice a day. On 06/28/21 were out of town, in the morning, sitting on bed, told mom her stomach hurt, she had seizure 5 minutes later, was tonic clonic, she fell in between the bed and nightstand, bruised her back and arm. Was making gurgling noises. Had about 4 minutes of shaking, called EMS, was post-ictal, agitated. Wasn't transported. Has been 20 years since this kind of seizure. No incontinence, she did bite her tongue.   October 16 2021: AC:  Patient presents today for follow-up, she is accompanied by her mother.  They reported the seizures have not been well controlled June 16 she has a generalized tonic-clonic seizure.  Onfi was added.  She had another seizure on September 9 again described as generalized tonic seizure.  At that time we added Topamax.  Currently she is on Keppra 2000 mg twice daily, Onfi 10 mg at night and Topamax 100 mg twice daily.  She does report side effect of slow thought processing, difficulty with concentrating difficulty with basic math problems. They have not discussed VNS therapy previously.   December 24 2021:  Patient presents today for follow-up, she is accompanied by her mother.  She is status post VNS placement.  Tolerated the procedure very well, she did follow-up with Dr. Kathyrn Sheriff and was told that everything is normal.  Overall  she is doing well.  Denies any seizures since last visit on October 4.  She is compliant with the medications and no side effects.   January 20 2022:  Patient presents today for, she is accompanied by her parents. She is doing well in terms of seizures.  No seizures since VNS placement. She is also tolerating the VNS very well.  Stated that she self swipe her VNS at least 3 times a day.  Denies any hoarseness or any other  adverse reactions.  Overall she is doing well.  She is also compliant with her antiseizure medications.  Currently no major concerns   February 17, 2022 Patient presents today for follow-up, she is accompanied by parents.  She is doing well, no seizure or seizure-like activity.  She is compliant with her Keppra, Topamax, and Onfi.  She continues to swipe her VNS at least 3 times a day.  Again no seizures and no additional side effects.  Overall doing well  March 10 2022:  Patient presents today for follow-up, last visit was in February 5.  Today she is accompanied by her mother.  Sadly she lost her father last week.  She denies any seizure or seizure-like activity.  She is compliant with her medication.  She tolerates the VNS very well, continue to swipe it at least 3 times daily.  Denies any side effects.   REVIEW OF SYSTEMS: Out of a complete 14 system review of symptoms, the patient complains only of the following symptoms, and all other reviewed systems are negative.  See HPI  ALLERGIES: Allergies  Allergen Reactions   Sulfa Antibiotics Hives    HOME MEDICATIONS: Outpatient Medications Prior to Visit  Medication Sig Dispense Refill   acetaminophen (TYLENOL) 500 MG tablet Take 1,000 mg by mouth every 6 (six) hours as needed for moderate pain or headache.     cloBAZam (ONFI) 10 MG tablet TAKE ONE TABLET ('10MG'$  TOTAL) BY MOUTH ATBEDTIME 30 tablet 5   ferrous sulfate (SLOW IRON) 160 (50 Fe) MG TBCR SR tablet Take 1 tablet by mouth daily.     levETIRAcetam (KEPPRA) 1000 MG tablet Take 2 tablets (2,000 mg total) by mouth 2 (two) times daily. 120 tablet 11   Multiple Vitamin (MULTIVITAMIN) tablet Take 1 tablet by mouth daily.     topiramate (TOPAMAX) 100 MG tablet Take 1 tablet (100 mg total) by mouth 2 (two) times daily. 60 tablet 11   No facility-administered medications prior to visit.    PAST MEDICAL HISTORY: Past Medical History:  Diagnosis Date   Developmental delay    mental  age of 79, per mother   Elevated LFTs    Gait abnormality 04/07/2018   History of stroke    age 25   Seizures ([place])    last seizure 09/2021    PAST SURGICAL HISTORY: Past Surgical History:  Procedure Laterality Date   BRAIN SURGERY     finger biopsy Right    MINOR EXCISION OF ORAL LESION N/A 05/01/2014   Procedure: EXCISION LIP MASS;  Surgeon: Leta Baptist, MD;  Location: [facility];  Service: ENT;  Laterality: N/A;   VAGUS NERVE STIMULATOR INSERTION Left 12/10/2021   Procedure: VAGAL NERVE STIMULATOR IMPLANT;  Surgeon: Consuella Lose, MD;  Location: [facility];  Service: Neurosurgery;  Laterality: Left;  3C    FAMILY HISTORY: Family History  Problem Relation Age of Onset   Colon polyps Mother    Asthma Maternal Grandmother    Diabetes Maternal Grandmother  Heart disease Maternal Grandfather    Lung cancer Paternal Grandmother    Colon cancer Maternal Aunt     SOCIAL HISTORY: Social History   Socioeconomic History   Marital status: Single    Spouse name: Not on file   Number of children: 0   Years of education: 12   Highest education level: Not on file  Occupational History   Not on file  Tobacco Use   Smoking status: Never   Smokeless tobacco: Never  Vaping Use   Vaping Use: Never used  Substance and Sexual Activity   Alcohol use: No   Drug use: No   Sexual activity: Never    Birth control/protection: None  Other Topics Concern   Not on file  Social History Narrative   Patient lives with mother [name].   Patient has no children.    Patient as a high school education.   Patient is working.    Patient is single   Patient is left handed.   Patient does not drink caffeine.   Social Determinants of Health   Financial Resource Strain: Low Risk  (05/17/2020)   Overall Financial Resource Strain (CARDIA)    Difficulty of Paying Living Expenses: Not hard at all  Food Insecurity: No Food Insecurity (05/17/2020)   Hunger Vital Sign    Worried About  Running Out of Food in the Last Year: Never true    Ran Out of Food in the Last Year: Never true  Transportation Needs: No Transportation Needs (05/17/2020)   PRAPARE - Hydrologist (Medical): No    Lack of Transportation (Non-Medical): No  Physical Activity: Insufficiently Active (05/17/2020)   Exercise Vital Sign    Days of Exercise per Week: 2 days    Minutes of Exercise per Session: 20 min  Stress: No Stress Concern Present (05/17/2020)   [place]    Feeling of Stress : Not at all  Social Connections: Moderately Integrated (05/17/2020)   Social Connection and Isolation Panel [NHANES]    Frequency of Communication with Friends and Family: More than three times a week    Frequency of Social Gatherings with Friends and Family: Twice a week    Attends Religious Services: More than 4 times per year    Active Member of [employer] or Organizations: Yes    Attends Archivist Meetings: More than 4 times per year    Marital Status: Never married  Intimate Partner Violence: Not At Risk (05/17/2020)   Humiliation, Afraid, Rape, and Kick questionnaire    Fear of Current or Ex-Partner: No    Emotionally Abused: No    Physically Abused: No    Sexually Abused: No    PHYSICAL EXAM  Vitals:   03/10/22 1551  BP: 117/88  Pulse: 74  Weight: 181 lb (82.1 kg)  Height: '5\' 4"'$  (1.626 m)     Body mass index is 31.07 kg/m.  Generalized: Well developed, in no acute distress  Neurological examination  Mentation: Alert oriented to time, place, history is mostly provided by her mother. Follows all commands speech and language fluent. Mild psychomotor retardation.  Cranial nerve II-XII: Extraocular movements were full, visual field were full on confrontational test. Facial sensation and strength were normal. Head turning and shoulder shrug  were normal and symmetric.No oral injury noted. Motor: Good strength  all extremities, slight weakness to right upper extremity, flexion of the right fingers/arm Sensory: Sensory testing is intact  to soft touch on all 4 extremities. No evidence of extinction is noted.  Coordination: Cerebellar testing reveals good finger-nose-finger and heel-to-shin bilaterally.  Gait and station: Gait is slightly wide-based, slight limp on the right Reflexes: Deep tendon reflexes are symmetric but slightly increased on the right   DIAGNOSTIC DATA (LABS, IMAGING, TESTING) - I reviewed patient records, labs, notes, testing and imaging myself where available.  Lab Results  Component Value Date   WBC 9.3 12/04/2021   HGB 12.4 12/04/2021   HCT 38.2 12/04/2021   MCV 85.5 12/04/2021   PLT 405 (H) 12/04/2021      Component Value Date/Time   NA 138 12/04/2021 1028   NA 138 07/04/2021 1449   K 4.0 12/04/2021 1028   CL 107 12/04/2021 1028   CO2 20 (L) 12/04/2021 1028   GLUCOSE 92 12/04/2021 1028   BUN 14 12/04/2021 1028   BUN 8 07/04/2021 1449   CREATININE 0.85 12/04/2021 1028   CALCIUM 9.9 12/04/2021 1028   PROT 7.7 11/13/2021 1516   PROT 7.1 09/03/2021 1110   ALBUMIN 4.3 11/13/2021 1516   ALBUMIN 4.3 09/03/2021 1110   AST 46 (H) 11/13/2021 1516   ALT 63 (H) 11/13/2021 1516   ALKPHOS 210 (H) 11/13/2021 1516   BILITOT 0.2 11/13/2021 1516   BILITOT <0.2 09/03/2021 1110   GFRNONAA >60 12/04/2021 1028   GFRAA 116 10/11/2019 1516   No results found for: "CHOL", "HDL", "LDLCALC", "LDLDIRECT", "TRIG", "CHOLHDL" No results found for: "HGBA1C" No results found for: "VITAMINB12" No results found for: "TSH"    Alric Ran, MD 03/10/2022, 6:07 PM [facility] [address], [place], [place] [address] [phone]

## 2022-03-13 ENCOUNTER — Ambulatory Visit
Admission: RE | Admit: 2022-03-13 | Discharge: 2022-03-13 | Disposition: A | Discharge status: Home / Self Care | Payer: Medicaid Other | Source: Ambulatory Visit | Attending: Nurse Practitioner | Admitting: Nurse Practitioner

## 2022-03-13 DIAGNOSIS — D376 Neoplasm of uncertain behavior of liver, gallbladder and bile ducts: Secondary | ICD-10-CM

## 2022-03-13 DIAGNOSIS — K739 Chronic hepatitis, unspecified: Secondary | ICD-10-CM

## 2022-03-13 DIAGNOSIS — R748 Abnormal levels of other serum enzymes: Secondary | ICD-10-CM

## 2022-03-25 ENCOUNTER — Ambulatory Visit (INDEPENDENT_AMBULATORY_CARE_PROVIDER_SITE_OTHER): Payer: Medicaid Other | Admitting: Neurology

## 2022-03-25 ENCOUNTER — Encounter: Payer: Self-pay | Admitting: Neurology

## 2022-03-25 VITALS — BP 110/66 | HR 68 | Ht 64.0 in | Wt 181.0 lb

## 2022-03-25 DIAGNOSIS — Z9689 Presence of other specified functional implants: Secondary | ICD-10-CM | POA: Diagnosis not present

## 2022-03-25 DIAGNOSIS — G40019 Localization-related (focal) (partial) idiopathic epilepsy and epileptic syndromes with seizures of localized onset, intractable, without status epilepticus: Secondary | ICD-10-CM | POA: Diagnosis not present

## 2022-03-25 NOTE — Progress Notes (Signed)
Patient: Vanessa Zuniga Date of Birth: 23-Dec-1985  Reason for Visit: Follow up for seizures History from: Patient, mother Primary Neurologist: Dr. April Manson   ASSESSMENT AND PLAN 37 y.o. year old female   1.  Cerebrovascular disease, right hemiparesis 2.  Intractable focal epilepsy 3.  Elevated alkaline phosphatase, AST, ALT 4. S/P VNS placement   -Continue with higher dose Keppra 2000 mg twice a day,  Onfi 10 mg at bedtime and Topamax 100 mg BID.  last seizure was 09/21/21 -EEG was normal in May 2023 -She is sp VNS placement, setting changes today, patient tolerated procedure very well.  -Follow-up in 2 weeks, for additional VNS settings changes    No orders of the defined types were placed in this encounter.   HISTORY OF PRESENT ILLNESS: Update 05/07/21 [name] here today for follow-up. Seeing GI, had liver biopsy, was unremarkable. Had LFT 04/23/21 Alk phos 354, AST 47, ALT 69. Here today to discuss medications, on Keppra 750 mg twice daily, Lamictal 200/300 mg daily. No seizures since Dec 2021. Liver biopsy was unremarkable. GI suggesting come off Lamictal? She feels fine, denies any problem or concerns. In past couldn't tolerate Vimpat. Was on Carbamazepine and Lamictal long term for seizures, d/c carbamazepine 2021 due to elevated alk phos long term.  Trend recap: -September 2021 alkaline phosphatase 320, AST 27, ALT 39, weaned off carbamazepine, continue Lamictal 300 mg twice daily, Vimpat was added -Seizure December 2021 on Vimpat 100 mg AM/150 MG PM and Lamictal 300 mg twice daily, alkaline phosphatase was 253, ALT 48, AST 35, had side effect of Vimpat, Keppra was added 750 mg twice daily -I saw her in March 2022, alkaline phosphatase was 305, AST 43, ALT 55 on Keppra 750 mg twice daily, Lamictal 300 mg twice daily, Lamictal level was 21.4 -Saw Dr. Jannifer Franklin in September 2022, alkaline phosphatase was 361, AST 42, ALT 46, on Keppra 750 mg twice daily, Lamictal 300 mg twice  daily, was referred to GI, Lamictal level was reduced 200/300 mg daily since Lamictal level was 29.4  July 04, 2021 SS: EEG was normal 05/14/21, last office visit 05/07/21 discontinuing Lamictal, increase Keppra 1500 mg twice a day. On 06/28/21 were out of town, in the morning, sitting on bed, told mom her stomach hurt, she had seizure 5 minutes later, was tonic clonic, she fell in between the bed and nightstand, bruised her back and arm. Was making gurgling noises. Had about 4 minutes of shaking, called EMS, was post-ictal, agitated. Wasn't transported. Has been 20 years since this kind of seizure. No incontinence, she did bite her tongue.   October 16 2021: AC:  Patient presents today for follow-up, she is accompanied by her mother.  They reported the seizures have not been well controlled June 16 she has a generalized tonic-clonic seizure.  Onfi was added.  She had another seizure on September 9 again described as generalized tonic seizure.  At that time we added Topamax.  Currently she is on Keppra 2000 mg twice daily, Onfi 10 mg at night and Topamax 100 mg twice daily.  She does report side effect of slow thought processing, difficulty with concentrating difficulty with basic math problems. They have not discussed VNS therapy previously.   December 24 2021:  Patient presents today for follow-up, she is accompanied by her mother.  She is status post VNS placement.  Tolerated the procedure very well, she did follow-up with Dr. Kathyrn Sheriff and was told that everything is normal.  Overall  she is doing well.  Denies any seizures since last visit on October 4.  She is compliant with the medications and no side effects.   January 20 2022:  Patient presents today for, she is accompanied by her parents. She is doing well in terms of seizures.  No seizures since VNS placement. She is also tolerating the VNS very well.  Stated that she self swipe her VNS at least 3 times a day.  Denies any hoarseness or any other  adverse reactions.  Overall she is doing well.  She is also compliant with her antiseizure medications.  Currently no major concerns   February 17, 2022 Patient presents today for follow-up, she is accompanied by parents.  She is doing well, no seizure or seizure-like activity.  She is compliant with her Keppra, Topamax, and Onfi.  She continues to swipe her VNS at least 3 times a day.  Again no seizures and no additional side effects.  Overall doing well  March 10 2022:  Patient presents today for follow-up, last visit was in February 5.  Today she is accompanied by her mother.  Sadly she lost her father last week.  She denies any seizure or seizure-like activity.  She is compliant with her medication.  She tolerates the VNS very well, continue to swipe it at least 3 times daily.  Denies any side effects.  March 25 2022:  Patient presents today for follow-up, she is accompanied by her mother.  Last visit was on February 26.  Since then no seizure or seizure-like activity.  She is tolerating the VNS very well.  She continues to swipe it at least 3 times daily.  No other complaints, denies any side effect from the VNS of side effect from the antiseizure medication.  REVIEW OF SYSTEMS: Out of a complete 14 system review of symptoms, the patient complains only of the following symptoms, and all other reviewed systems are negative.  See HPI  ALLERGIES: Allergies  Allergen Reactions   Sulfa Antibiotics Hives    HOME MEDICATIONS: Outpatient Medications Prior to Visit  Medication Sig Dispense Refill   acetaminophen (TYLENOL) 500 MG tablet Take 1,000 mg by mouth every 6 (six) hours as needed for moderate pain or headache.     cloBAZam (ONFI) 10 MG tablet TAKE ONE TABLET ('10MG'$  TOTAL) BY MOUTH ATBEDTIME 30 tablet 5   ferrous sulfate (SLOW IRON) 160 (50 Fe) MG TBCR SR tablet Take 1 tablet by mouth daily.     levETIRAcetam (KEPPRA) 1000 MG tablet Take 2 tablets (2,000 mg total) by mouth 2 (two)  times daily. 120 tablet 11   Multiple Vitamin (MULTIVITAMIN) tablet Take 1 tablet by mouth daily.     topiramate (TOPAMAX) 100 MG tablet Take 1 tablet (100 mg total) by mouth 2 (two) times daily. 60 tablet 11   No facility-administered medications prior to visit.    PAST MEDICAL HISTORY: Past Medical History:  Diagnosis Date   Developmental delay    mental age of 35, per mother   Elevated LFTs    Gait abnormality 04/07/2018   History of stroke    age 8   Seizures ([place])    last seizure 09/2021    PAST SURGICAL HISTORY: Past Surgical History:  Procedure Laterality Date   BRAIN SURGERY     finger biopsy Right    MINOR EXCISION OF ORAL LESION N/A 05/01/2014   Procedure: EXCISION LIP MASS;  Surgeon: Leta Baptist, MD;  Location: [facility];  Service:  ENT;  Laterality: N/A;   VAGUS NERVE STIMULATOR INSERTION Left 12/10/2021   Procedure: VAGAL NERVE STIMULATOR IMPLANT;  Surgeon: Consuella Lose, MD;  Location: [facility];  Service: Neurosurgery;  Laterality: Left;  3C    FAMILY HISTORY: Family History  Problem Relation Age of Onset   Colon polyps Mother    Asthma Maternal Grandmother    Diabetes Maternal Grandmother    Heart disease Maternal Grandfather    Lung cancer Paternal Grandmother    Colon cancer Maternal Aunt     SOCIAL HISTORY: Social History   Socioeconomic History   Marital status: Single    Spouse name: Not on file   Number of children: 0   Years of education: 12   Highest education level: Not on file  Occupational History   Not on file  Tobacco Use   Smoking status: Never   Smokeless tobacco: Never  Vaping Use   Vaping Use: Never used  Substance and Sexual Activity   Alcohol use: No   Drug use: No   Sexual activity: Never    Birth control/protection: None  Other Topics Concern   Not on file  Social History Narrative   Patient lives with mother [name].   Patient has no children.    Patient as a high school education.   Patient is  working.    Patient is single   Patient is left handed.   Patient does not drink caffeine.   Social Determinants of Health   Financial Resource Strain: Low Risk  (05/17/2020)   Overall Financial Resource Strain (CARDIA)    Difficulty of Paying Living Expenses: Not hard at all  Food Insecurity: No Food Insecurity (05/17/2020)   Hunger Vital Sign    Worried About Running Out of Food in the Last Year: Never true    Ran Out of Food in the Last Year: Never true  Transportation Needs: No Transportation Needs (05/17/2020)   PRAPARE - Hydrologist (Medical): No    Lack of Transportation (Non-Medical): No  Physical Activity: Insufficiently Active (05/17/2020)   Exercise Vital Sign    Days of Exercise per Week: 2 days    Minutes of Exercise per Session: 20 min  Stress: No Stress Concern Present (05/17/2020)   [place]    Feeling of Stress : Not at all  Social Connections: Moderately Integrated (05/17/2020)   Social Connection and Isolation Panel [NHANES]    Frequency of Communication with Friends and Family: More than three times a week    Frequency of Social Gatherings with Friends and Family: Twice a week    Attends Religious Services: More than 4 times per year    Active Member of [employer] or Organizations: Yes    Attends Archivist Meetings: More than 4 times per year    Marital Status: Never married  Intimate Partner Violence: Not At Risk (05/17/2020)   Humiliation, Afraid, Rape, and Kick questionnaire    Fear of Current or Ex-Partner: No    Emotionally Abused: No    Physically Abused: No    Sexually Abused: No    PHYSICAL EXAM  Vitals:   03/25/22 1542  BP: 110/66  Pulse: 68  Weight: 181 lb (82.1 kg)  Height: '5\' 4"'$  (1.626 m)     Body mass index is 31.07 kg/m.  Generalized: Well developed, in no acute distress  Neurological examination  Mentation: Alert oriented to time, place,  history is mostly  provided by her mother. Follows all commands speech and language fluent. Mild psychomotor retardation.  Cranial nerve II-XII: Extraocular movements were full, visual field were full on confrontational test. Facial sensation and strength were normal. Head turning and shoulder shrug  were normal and symmetric.No oral injury noted. Motor: Good strength all extremities, slight weakness to right upper extremity, flexion of the right fingers/arm Sensory: Sensory testing is intact to soft touch on all 4 extremities. No evidence of extinction is noted.  Coordination: Cerebellar testing reveals good finger-nose-finger and heel-to-shin bilaterally.  Gait and station: Gait is slightly wide-based, slight limp on the right Reflexes: Deep tendon reflexes are symmetric but slightly increased on the right   DIAGNOSTIC DATA (LABS, IMAGING, TESTING) - I reviewed patient records, labs, notes, testing and imaging myself where available.  Lab Results  Component Value Date   WBC 9.3 12/04/2021   HGB 12.4 12/04/2021   HCT 38.2 12/04/2021   MCV 85.5 12/04/2021   PLT 405 (H) 12/04/2021      Component Value Date/Time   NA 138 12/04/2021 1028   NA 138 07/04/2021 1449   K 4.0 12/04/2021 1028   CL 107 12/04/2021 1028   CO2 20 (L) 12/04/2021 1028   GLUCOSE 92 12/04/2021 1028   BUN 14 12/04/2021 1028   BUN 8 07/04/2021 1449   CREATININE 0.85 12/04/2021 1028   CALCIUM 9.9 12/04/2021 1028   PROT 7.7 11/13/2021 1516   PROT 7.1 09/03/2021 1110   ALBUMIN 4.3 11/13/2021 1516   ALBUMIN 4.3 09/03/2021 1110   AST 46 (H) 11/13/2021 1516   ALT 63 (H) 11/13/2021 1516   ALKPHOS 210 (H) 11/13/2021 1516   BILITOT 0.2 11/13/2021 1516   BILITOT <0.2 09/03/2021 1110   GFRNONAA >60 12/04/2021 1028   GFRAA 116 10/11/2019 1516   No results found for: "CHOL", "HDL", "LDLCALC", "LDLDIRECT", "TRIG", "CHOLHDL" No results found for: "HGBA1C" No results found for: "VITAMINB12" No results found for:  "TSH"    Alric Ran, MD 03/25/2022, 4:04 PM [facility] [address], [place], [place] [address] [phone]

## 2022-04-01 DIAGNOSIS — Z0271 Encounter for disability determination: Secondary | ICD-10-CM

## 2022-04-08 ENCOUNTER — Ambulatory Visit (INDEPENDENT_AMBULATORY_CARE_PROVIDER_SITE_OTHER): Payer: Medicaid Other | Admitting: Neurology

## 2022-04-08 ENCOUNTER — Encounter: Payer: Self-pay | Admitting: Neurology

## 2022-04-08 VITALS — BP 118/88 | HR 66 | Ht 64.5 in | Wt 182.0 lb

## 2022-04-08 DIAGNOSIS — Z9689 Presence of other specified functional implants: Secondary | ICD-10-CM | POA: Diagnosis not present

## 2022-04-08 DIAGNOSIS — G40019 Localization-related (focal) (partial) idiopathic epilepsy and epileptic syndromes with seizures of localized onset, intractable, without status epilepticus: Secondary | ICD-10-CM | POA: Diagnosis not present

## 2022-04-08 NOTE — Progress Notes (Signed)
Patient: Vanessa Zuniga Date of Birth: 01-01-1986  Reason for Visit: Follow up for seizures History from: Patient, mother Primary Neurologist: Dr. April Manson   ASSESSMENT AND PLAN 37 y.o. year old female   1.  Cerebrovascular disease, right hemiparesis 2.  Intractable focal epilepsy 3.  S/P VNS placement   -Continue with higher dose Keppra 2000 mg twice a day,  Onfi 10 mg at bedtime and Topamax 100 mg BID.  last seizure was 09/21/21 -EEG was normal in May 2023 -She is sp VNS placement, setting changes today, patient tolerated procedure very well.  -Follow-up in 2 weeks, for additional VNS settings changes    No orders of the defined types were placed in this encounter.   HISTORY OF PRESENT ILLNESS: Update 05/07/21 [name] here today for follow-up. Seeing GI, had liver biopsy, was unremarkable. Had LFT 04/23/21 Alk phos 354, AST 47, ALT 69. Here today to discuss medications, on Keppra 750 mg twice daily, Lamictal 200/300 mg daily. No seizures since Dec 2021. Liver biopsy was unremarkable. GI suggesting come off Lamictal? She feels fine, denies any problem or concerns. In past couldn't tolerate Vimpat. Was on Carbamazepine and Lamictal long term for seizures, d/c carbamazepine 2021 due to elevated alk phos long term.  Trend recap: -September 2021 alkaline phosphatase 320, AST 27, ALT 39, weaned off carbamazepine, continue Lamictal 300 mg twice daily, Vimpat was added -Seizure December 2021 on Vimpat 100 mg AM/150 MG PM and Lamictal 300 mg twice daily, alkaline phosphatase was 253, ALT 48, AST 35, had side effect of Vimpat, Keppra was added 750 mg twice daily -I saw her in March 2022, alkaline phosphatase was 305, AST 43, ALT 55 on Keppra 750 mg twice daily, Lamictal 300 mg twice daily, Lamictal level was 21.4 -Saw Dr. Jannifer Franklin in September 2022, alkaline phosphatase was 361, AST 42, ALT 46, on Keppra 750 mg twice daily, Lamictal 300 mg twice daily, was referred to GI, Lamictal level was  reduced 200/300 mg daily since Lamictal level was 29.4  July 04, 2021 SS: EEG was normal 05/14/21, last office visit 05/07/21 discontinuing Lamictal, increase Keppra 1500 mg twice a day. On 06/28/21 were out of town, in the morning, sitting on bed, told mom her stomach hurt, she had seizure 5 minutes later, was tonic clonic, she fell in between the bed and nightstand, bruised her back and arm. Was making gurgling noises. Had about 4 minutes of shaking, called EMS, was post-ictal, agitated. Wasn't transported. Has been 20 years since this kind of seizure. No incontinence, she did bite her tongue.   October 16 2021: AC:  Patient presents today for follow-up, she is accompanied by her mother.  They reported the seizures have not been well controlled June 16 she has a generalized tonic-clonic seizure.  Onfi was added.  She had another seizure on September 9 again described as generalized tonic seizure.  At that time we added Topamax.  Currently she is on Keppra 2000 mg twice daily, Onfi 10 mg at night and Topamax 100 mg twice daily.  She does report side effect of slow thought processing, difficulty with concentrating difficulty with basic math problems. They have not discussed VNS therapy previously.   December 24 2021:  Patient presents today for follow-up, she is accompanied by her mother.  She is status post VNS placement.  Tolerated the procedure very well, she did follow-up with Dr. Kathyrn Sheriff and was told that everything is normal.  Overall she is doing well.  Denies  any seizures since last visit on October 4.  She is compliant with the medications and no side effects.   January 20 2022:  Patient presents today for, she is accompanied by her parents. She is doing well in terms of seizures.  No seizures since VNS placement. She is also tolerating the VNS very well.  Stated that she self swipe her VNS at least 3 times a day.  Denies any hoarseness or any other adverse reactions.  Overall she is doing well.   She is also compliant with her antiseizure medications.  Currently no major concerns   February 17, 2022 Patient presents today for follow-up, she is accompanied by parents.  She is doing well, no seizure or seizure-like activity.  She is compliant with her Keppra, Topamax, and Onfi.  She continues to swipe her VNS at least 3 times a day.  Again no seizures and no additional side effects.  Overall doing well  March 10 2022:  Patient presents today for follow-up, last visit was in February 5.  Today she is accompanied by her mother.  Sadly she lost her father last week.  She denies any seizure or seizure-like activity.  She is compliant with her medication.  She tolerates the VNS very well, continue to swipe it at least 3 times daily.  Denies any side effects.  March 25 2022:  Patient presents today for follow-up, she is accompanied by her mother.  Last visit was on February 26.  Since then no seizure or seizure-like activity.  She is tolerating the VNS very well.  She continues to swipe it at least 3 times daily.  No other complaints, denies any side effect from the VNS of side effect from the antiseizure medication.  April 08 2022:  Patient presents today for follow-up, she is accompanied by mother. Last visit was in March 12.  At that time, VNS settings were changed and patient tolerated procedure well.  She denies any seizures since last visit.   Overall she is doing very well and tolerating the VNS well, she swipes it a least 3 times per day     REVIEW OF SYSTEMS: Out of a complete 14 system review of symptoms, the patient complains only of the following symptoms, and all other reviewed systems are negative.  See HPI  ALLERGIES: Allergies  Allergen Reactions   Sulfa Antibiotics Hives    HOME MEDICATIONS: Outpatient Medications Prior to Visit  Medication Sig Dispense Refill   acetaminophen (TYLENOL) 500 MG tablet Take 1,000 mg by mouth every 6 (six) hours as needed for moderate  pain or headache.     cloBAZam (ONFI) 10 MG tablet TAKE ONE TABLET (10MG  TOTAL) BY MOUTH ATBEDTIME 30 tablet 5   ferrous sulfate (SLOW IRON) 160 (50 Fe) MG TBCR SR tablet Take 1 tablet by mouth daily.     levETIRAcetam (KEPPRA) 1000 MG tablet Take 2 tablets (2,000 mg total) by mouth 2 (two) times daily. 120 tablet 11   Multiple Vitamin (MULTIVITAMIN) tablet Take 1 tablet by mouth daily.     topiramate (TOPAMAX) 100 MG tablet Take 1 tablet (100 mg total) by mouth 2 (two) times daily. 60 tablet 11   No facility-administered medications prior to visit.    PAST MEDICAL HISTORY: Past Medical History:  Diagnosis Date   Developmental delay    mental age of 41, per mother   Elevated LFTs    Gait abnormality 04/07/2018   History of stroke    age 28  Seizures ([place])    last seizure 09/2021    PAST SURGICAL HISTORY: Past Surgical History:  Procedure Laterality Date   BRAIN SURGERY     finger biopsy Right    MINOR EXCISION OF ORAL LESION N/A 05/01/2014   Procedure: EXCISION LIP MASS;  Surgeon: Leta Baptist, MD;  Location: [facility];  Service: ENT;  Laterality: N/A;   VAGUS NERVE STIMULATOR INSERTION Left 12/10/2021   Procedure: VAGAL NERVE STIMULATOR IMPLANT;  Surgeon: Consuella Lose, MD;  Location: [facility];  Service: Neurosurgery;  Laterality: Left;  3C    FAMILY HISTORY: Family History  Problem Relation Age of Onset   Colon polyps Mother    Asthma Maternal Grandmother    Diabetes Maternal Grandmother    Heart disease Maternal Grandfather    Lung cancer Paternal Grandmother    Colon cancer Maternal Aunt     SOCIAL HISTORY: Social History   Socioeconomic History   Marital status: Single    Spouse name: Not on file   Number of children: 0   Years of education: 12   Highest education level: Not on file  Occupational History   Not on file  Tobacco Use   Smoking status: Never   Smokeless tobacco: Never  Vaping Use   Vaping Use: Never used  Substance and Sexual  Activity   Alcohol use: No   Drug use: No   Sexual activity: Never    Birth control/protection: None  Other Topics Concern   Not on file  Social History Narrative   Patient lives with mother [name].   Patient has no children.    Patient as a high school education.   Patient is working.    Patient is single   Patient is left handed.   Patient does not drink caffeine.   Social Determinants of Health   Financial Resource Strain: Low Risk  (05/17/2020)   Overall Financial Resource Strain (CARDIA)    Difficulty of Paying Living Expenses: Not hard at all  Food Insecurity: No Food Insecurity (05/17/2020)   Hunger Vital Sign    Worried About Running Out of Food in the Last Year: Never true    Ran Out of Food in the Last Year: Never true  Transportation Needs: No Transportation Needs (05/17/2020)   PRAPARE - Hydrologist (Medical): No    Lack of Transportation (Non-Medical): No  Physical Activity: Insufficiently Active (05/17/2020)   Exercise Vital Sign    Days of Exercise per Week: 2 days    Minutes of Exercise per Session: 20 min  Stress: No Stress Concern Present (05/17/2020)   [place]    Feeling of Stress : Not at all  Social Connections: Moderately Integrated (05/17/2020)   Social Connection and Isolation Panel [NHANES]    Frequency of Communication with Friends and Family: More than three times a week    Frequency of Social Gatherings with Friends and Family: Twice a week    Attends Religious Services: More than 4 times per year    Active Member of [employer] or Organizations: Yes    Attends Archivist Meetings: More than 4 times per year    Marital Status: Never married  Intimate Partner Violence: Not At Risk (05/17/2020)   Humiliation, Afraid, Rape, and Kick questionnaire    Fear of Current or Ex-Partner: No    Emotionally Abused: No    Physically Abused: No    Sexually Abused: No  PHYSICAL EXAM  Vitals:   04/08/22 1533  BP: 118/88  Pulse: 66  Weight: 182 lb (82.6 kg)  Height: 5' 4.5" (1.638 m)     Body mass index is 30.76 kg/m.  Generalized: Well developed, in no acute distress  Neurological examination  Mentation: Alert oriented to time, place, history is mostly provided by her mother. Follows all commands speech and language fluent. Mild psychomotor retardation.  Cranial nerve II-XII: Extraocular movements were full, visual field were full on confrontational test. Facial sensation and strength were normal. Head turning and shoulder shrug  were normal and symmetric.No oral injury noted. Motor: Good strength all extremities, slight weakness to right upper extremity, flexion of the right fingers/arm Sensory: Sensory testing is intact to soft touch on all 4 extremities. No evidence of extinction is noted.  Coordination: Cerebellar testing reveals good finger-nose-finger and heel-to-shin bilaterally.  Gait and station: Gait is slightly wide-based, slight limp on the right Reflexes: Deep tendon reflexes are symmetric but slightly increased on the right   DIAGNOSTIC DATA (LABS, IMAGING, TESTING) - I reviewed patient records, labs, notes, testing and imaging myself where available.  Lab Results  Component Value Date   WBC 9.3 12/04/2021   HGB 12.4 12/04/2021   HCT 38.2 12/04/2021   MCV 85.5 12/04/2021   PLT 405 (H) 12/04/2021      Component Value Date/Time   NA 138 12/04/2021 1028   NA 138 07/04/2021 1449   K 4.0 12/04/2021 1028   CL 107 12/04/2021 1028   CO2 20 (L) 12/04/2021 1028   GLUCOSE 92 12/04/2021 1028   BUN 14 12/04/2021 1028   BUN 8 07/04/2021 1449   CREATININE 0.85 12/04/2021 1028   CALCIUM 9.9 12/04/2021 1028   PROT 7.7 11/13/2021 1516   PROT 7.1 09/03/2021 1110   ALBUMIN 4.3 11/13/2021 1516   ALBUMIN 4.3 09/03/2021 1110   AST 46 (H) 11/13/2021 1516   ALT 63 (H) 11/13/2021 1516   ALKPHOS 210 (H) 11/13/2021 1516   BILITOT 0.2  11/13/2021 1516   BILITOT <0.2 09/03/2021 1110   GFRNONAA >60 12/04/2021 1028   GFRAA 116 10/11/2019 1516   No results found for: "CHOL", "HDL", "LDLCALC", "LDLDIRECT", "TRIG", "CHOLHDL" No results found for: "HGBA1C" No results found for: "VITAMINB12" No results found for: "TSH"    Alric Ran, MD 04/08/2022, 4:02 PM [facility] [address], [place],  [address] [phone]

## 2022-04-10 ENCOUNTER — Ambulatory Visit
Admission: RE | Admit: 2022-04-10 | Discharge: 2022-04-10 | Disposition: A | Discharge status: Home / Self Care | Payer: Medicaid Other | Source: Ambulatory Visit | Attending: Nurse Practitioner | Admitting: Nurse Practitioner

## 2022-04-10 MED ORDER — IOPAMIDOL (ISOVUE-300) INJECTION 61%
100.0000 mL | Freq: Once | INTRAVENOUS | Status: AC | PRN
  Administered 2022-04-10: 100 mL via INTRAVENOUS

## 2022-04-22 ENCOUNTER — Ambulatory Visit (INDEPENDENT_AMBULATORY_CARE_PROVIDER_SITE_OTHER): Payer: Medicaid Other | Admitting: Neurology

## 2022-04-22 ENCOUNTER — Encounter: Payer: Self-pay | Admitting: Neurology

## 2022-04-22 VITALS — BP 100/65 | HR 64 | Ht 64.5 in | Wt 181.0 lb

## 2022-04-22 DIAGNOSIS — Z9689 Presence of other specified functional implants: Secondary | ICD-10-CM

## 2022-04-22 DIAGNOSIS — G40019 Localization-related (focal) (partial) idiopathic epilepsy and epileptic syndromes with seizures of localized onset, intractable, without status epilepticus: Secondary | ICD-10-CM

## 2022-04-22 NOTE — Progress Notes (Signed)
Patient: Vanessa Zuniga Date of Birth: 01-01-1986  Reason for Visit: Follow up for seizures History from: Patient, mother Primary Neurologist: Dr. April Manson   ASSESSMENT AND PLAN 37 y.o. year old female   1.  Cerebrovascular disease, right hemiparesis 2.  Intractable focal epilepsy 3.  S/P VNS placement   -Continue with higher dose Keppra 2000 mg twice a day,  Onfi 10 mg at bedtime and Topamax 100 mg BID.  last seizure was 09/21/21 -EEG was normal in May 2023 -She is sp VNS placement, setting changes today, patient tolerated procedure very well.  -Follow-up in 2 weeks, for additional VNS settings changes    No orders of the defined types were placed in this encounter.   HISTORY OF PRESENT ILLNESS: Update 05/07/21 [name] here today for follow-up. Seeing GI, had liver biopsy, was unremarkable. Had LFT 04/23/21 Alk phos 354, AST 47, ALT 69. Here today to discuss medications, on Keppra 750 mg twice daily, Lamictal 200/300 mg daily. No seizures since Dec 2021. Liver biopsy was unremarkable. GI suggesting come off Lamictal? She feels fine, denies any problem or concerns. In past couldn't tolerate Vimpat. Was on Carbamazepine and Lamictal long term for seizures, d/c carbamazepine 2021 due to elevated alk phos long term.  Trend recap: -September 2021 alkaline phosphatase 320, AST 27, ALT 39, weaned off carbamazepine, continue Lamictal 300 mg twice daily, Vimpat was added -Seizure December 2021 on Vimpat 100 mg AM/150 MG PM and Lamictal 300 mg twice daily, alkaline phosphatase was 253, ALT 48, AST 35, had side effect of Vimpat, Keppra was added 750 mg twice daily -I saw her in March 2022, alkaline phosphatase was 305, AST 43, ALT 55 on Keppra 750 mg twice daily, Lamictal 300 mg twice daily, Lamictal level was 21.4 -Saw Dr. Jannifer Franklin in September 2022, alkaline phosphatase was 361, AST 42, ALT 46, on Keppra 750 mg twice daily, Lamictal 300 mg twice daily, was referred to GI, Lamictal level was  reduced 200/300 mg daily since Lamictal level was 29.4  July 04, 2021 SS: EEG was normal 05/14/21, last office visit 05/07/21 discontinuing Lamictal, increase Keppra 1500 mg twice a day. On 06/28/21 were out of town, in the morning, sitting on bed, told mom her stomach hurt, she had seizure 5 minutes later, was tonic clonic, she fell in between the bed and nightstand, bruised her back and arm. Was making gurgling noises. Had about 4 minutes of shaking, called EMS, was post-ictal, agitated. Wasn't transported. Has been 20 years since this kind of seizure. No incontinence, she did bite her tongue.   October 16 2021: AC:  Patient presents today for follow-up, she is accompanied by her mother.  They reported the seizures have not been well controlled June 16 she has a generalized tonic-clonic seizure.  Onfi was added.  She had another seizure on September 9 again described as generalized tonic seizure.  At that time we added Topamax.  Currently she is on Keppra 2000 mg twice daily, Onfi 10 mg at night and Topamax 100 mg twice daily.  She does report side effect of slow thought processing, difficulty with concentrating difficulty with basic math problems. They have not discussed VNS therapy previously.   December 24 2021:  Patient presents today for follow-up, she is accompanied by her mother.  She is status post VNS placement.  Tolerated the procedure very well, she did follow-up with Dr. Kathyrn Sheriff and was told that everything is normal.  Overall she is doing well.  Denies  any seizures since last visit on October 4.  She is compliant with the medications and no side effects.   January 20 2022:  Patient presents today for, she is accompanied by her parents. She is doing well in terms of seizures.  No seizures since VNS placement. She is also tolerating the VNS very well.  Stated that she self swipe her VNS at least 3 times a day.  Denies any hoarseness or any other adverse reactions.  Overall she is doing well.   She is also compliant with her antiseizure medications.  Currently no major concerns   February 17, 2022 Patient presents today for follow-up, she is accompanied by parents.  She is doing well, no seizure or seizure-like activity.  She is compliant with her Keppra, Topamax, and Onfi.  She continues to swipe her VNS at least 3 times a day.  Again no seizures and no additional side effects.  Overall doing well  March 10 2022:  Patient presents today for follow-up, last visit was in February 5.  Today she is accompanied by her mother.  Sadly she lost her father last week.  She denies any seizure or seizure-like activity.  She is compliant with her medication.  She tolerates the VNS very well, continue to swipe it at least 3 times daily.  Denies any side effects.  March 25 2022:  Patient presents today for follow-up, she is accompanied by her mother.  Last visit was on February 26.  Since then no seizure or seizure-like activity.  She is tolerating the VNS very well.  She continues to swipe it at least 3 times daily.  No other complaints, denies any side effect from the VNS of side effect from the antiseizure medication.  April 08 2022:  Patient presents today for follow-up, she is accompanied by mother. Last visit was in March 12.  At that time, VNS settings were changed and patient tolerated procedure well.  She denies any seizures since last visit.   Overall she is doing very well and tolerating the VNS well, she swipes it a least 3 times per day   April 22 2022:  Patient presented for follow-up, she is accompanied by her mother.  Last visit was on March 26 at that time we increased the VNS setting and she tolerated procedure very well.  She denies any seizure or seizure-like activity.  She continues to swipe her VNS at least 3 times a day.  Overall she is doing very well, no other complaints.   REVIEW OF SYSTEMS: Out of a complete 14 system review of symptoms, the patient complains only of the  following symptoms, and all other reviewed systems are negative.  See HPI  ALLERGIES: Allergies  Allergen Reactions   Sulfa Antibiotics Hives    HOME MEDICATIONS: Outpatient Medications Prior to Visit  Medication Sig Dispense Refill   acetaminophen (TYLENOL) 500 MG tablet Take 1,000 mg by mouth every 6 (six) hours as needed for moderate pain or headache.     cloBAZam (ONFI) 10 MG tablet TAKE ONE TABLET (10MG  TOTAL) BY MOUTH ATBEDTIME 30 tablet 5   ferrous sulfate (SLOW IRON) 160 (50 Fe) MG TBCR SR tablet Take 1 tablet by mouth daily.     levETIRAcetam (KEPPRA) 1000 MG tablet Take 2 tablets (2,000 mg total) by mouth 2 (two) times daily. 120 tablet 11   Multiple Vitamin (MULTIVITAMIN) tablet Take 1 tablet by mouth daily.     topiramate (TOPAMAX) 100 MG tablet Take 1 tablet (100  mg total) by mouth 2 (two) times daily. 60 tablet 11   No facility-administered medications prior to visit.    PAST MEDICAL HISTORY: Past Medical History:  Diagnosis Date   Developmental delay    mental age of 89, per mother   Elevated LFTs    Gait abnormality 04/07/2018   History of stroke    age 89   Seizures    last seizure 09/2021    PAST SURGICAL HISTORY: Past Surgical History:  Procedure Laterality Date   BRAIN SURGERY     finger biopsy Right    MINOR EXCISION OF ORAL LESION N/A 05/01/2014   Procedure: EXCISION LIP MASS;  Surgeon: Newman PiesSu Teoh, MD;  Location: [facility];  Service: ENT;  Laterality: N/A;   VAGUS NERVE STIMULATOR INSERTION Left 12/10/2021   Procedure: VAGAL NERVE STIMULATOR IMPLANT;  Surgeon: Lisbeth RenshawNundkumar, Neelesh, MD;  Location: [facility] OR;  Service: Neurosurgery;  Laterality: Left;  3C    FAMILY HISTORY: Family History  Problem Relation Age of Onset   Colon polyps Mother    Asthma Maternal Grandmother    Diabetes Maternal Grandmother    Heart disease Maternal Grandfather    Lung cancer Paternal Grandmother    Colon cancer Maternal Aunt     SOCIAL HISTORY: Social  History   Socioeconomic History   Marital status: Single    Spouse name: Not on file   Number of children: 0   Years of education: 12   Highest education level: Not on file  Occupational History   Not on file  Tobacco Use   Smoking status: Never   Smokeless tobacco: Never  Vaping Use   Vaping Use: Never used  Substance and Sexual Activity   Alcohol use: No   Drug use: No   Sexual activity: Never    Birth control/protection: None  Other Topics Concern   Not on file  Social History Narrative   Patient lives with mother Patsy LagerYolanda.   Patient has no children.    Patient as a high school education.   Patient is working.    Patient is single   Patient is left handed.   Patient does not drink caffeine.   Social Determinants of Health   Financial Resource Strain: Low Risk  (05/17/2020)   Overall Financial Resource Strain (CARDIA)    Difficulty of Paying Living Expenses: Not hard at all  Food Insecurity: No Food Insecurity (05/17/2020)   Hunger Vital Sign    Worried About Running Out of Food in the Last Year: Never true    Ran Out of Food in the Last Year: Never true  Transportation Needs: No Transportation Needs (05/17/2020)   PRAPARE - Administrator, Civil ServiceTransportation    Lack of Transportation (Medical): No    Lack of Transportation (Non-Medical): No  Physical Activity: Insufficiently Active (05/17/2020)   Exercise Vital Sign    Days of Exercise per Week: 2 days    Minutes of Exercise per Session: 20 min  Stress: No Stress Concern Present (05/17/2020)   [employer] - Occupational Stress Questionnaire    Feeling of Stress : Not at all  Social Connections: Moderately Integrated (05/17/2020)   Social Connection and Isolation Panel [NHANES]    Frequency of Communication with Friends and Family: More than three times a week    Frequency of Social Gatherings with Friends and Family: Twice a week    Attends Religious Services: More than 4 times per year    Active Member of Clubs  or  Organizations: Yes    Attends Engineer, structural: More than 4 times per year    Marital Status: Never married  Intimate Partner Violence: Not At Risk (05/17/2020)   Humiliation, Afraid, Rape, and Kick questionnaire    Fear of Current or Ex-Partner: No    Emotionally Abused: No    Physically Abused: No    Sexually Abused: No    PHYSICAL EXAM  Vitals:   04/22/22 1537  BP: 100/65  Pulse: 64  Weight: 181 lb (82.1 kg)  Height: 5' 4.5" (1.638 m)     Body mass index is 30.59 kg/m.  Generalized: Well developed, in no acute distress  Neurological examination  Mentation: Alert oriented to time, place, history is mostly provided by her mother. Follows all commands speech and language fluent. Mild psychomotor retardation.  Cranial nerve II-XII: Extraocular movements were full, visual field were full on confrontational test. Facial sensation and strength were normal. Head turning and shoulder shrug  were normal and symmetric.No oral injury noted. Motor: Good strength all extremities, slight weakness to right upper extremity, flexion of the right fingers/arm Sensory: Sensory testing is intact to soft touch on all 4 extremities. No evidence of extinction is noted.  Coordination: Cerebellar testing reveals good finger-nose-finger and heel-to-shin bilaterally.  Gait and station: Gait is slightly wide-based, slight limp on the right Reflexes: Deep tendon reflexes are symmetric but slightly increased on the right   DIAGNOSTIC DATA (LABS, IMAGING, TESTING) - I reviewed patient records, labs, notes, testing and imaging myself where available.  Lab Results  Component Value Date   WBC 9.3 12/04/2021   HGB 12.4 12/04/2021   HCT 38.2 12/04/2021   MCV 85.5 12/04/2021   PLT 405 (H) 12/04/2021      Component Value Date/Time   NA 138 12/04/2021 1028   NA 138 07/04/2021 1449   K 4.0 12/04/2021 1028   CL 107 12/04/2021 1028   CO2 20 (L) 12/04/2021 1028   GLUCOSE 92 12/04/2021 1028    BUN 14 12/04/2021 1028   BUN 8 07/04/2021 1449   CREATININE 0.85 12/04/2021 1028   CALCIUM 9.9 12/04/2021 1028   PROT 7.7 11/13/2021 1516   PROT 7.1 09/03/2021 1110   ALBUMIN 4.3 11/13/2021 1516   ALBUMIN 4.3 09/03/2021 1110   AST 46 (H) 11/13/2021 1516   ALT 63 (H) 11/13/2021 1516   ALKPHOS 210 (H) 11/13/2021 1516   BILITOT 0.2 11/13/2021 1516   BILITOT <0.2 09/03/2021 1110   GFRNONAA >60 12/04/2021 1028   GFRAA 116 10/11/2019 1516   No results found for: "CHOL", "HDL", "LDLCALC", "LDLDIRECT", "TRIG", "CHOLHDL" No results found for: "HGBA1C" No results found for: "VITAMINB12" No results found for: "TSH"    Windell Norfolk, MD 04/22/2022, 4:19 PM [facility] [address], [address] [place], [place] [address] [phone]

## 2022-05-06 ENCOUNTER — Ambulatory Visit (INDEPENDENT_AMBULATORY_CARE_PROVIDER_SITE_OTHER): Payer: Medicaid Other | Admitting: Neurology

## 2022-05-06 VITALS — BP 119/75 | HR 70 | Ht 64.0 in | Wt 182.5 lb

## 2022-05-06 DIAGNOSIS — G40019 Localization-related (focal) (partial) idiopathic epilepsy and epileptic syndromes with seizures of localized onset, intractable, without status epilepticus: Secondary | ICD-10-CM | POA: Diagnosis not present

## 2022-05-06 DIAGNOSIS — Z9689 Presence of other specified functional implants: Secondary | ICD-10-CM | POA: Diagnosis not present

## 2022-05-07 ENCOUNTER — Encounter: Payer: Self-pay | Admitting: Neurology

## 2022-05-07 NOTE — Progress Notes (Signed)
Patient: Vanessa Zuniga Date of Birth: 01-01-1986  Reason for Visit: Follow up for seizures History from: Patient, mother Primary Neurologist: Dr. April Manson   ASSESSMENT AND PLAN 37 y.o. year old female   1.  Cerebrovascular disease, right hemiparesis 2.  Intractable focal epilepsy 3.  S/P VNS placement   -Continue with higher dose Keppra 2000 mg twice a day,  Onfi 10 mg at bedtime and Topamax 100 mg BID.  last seizure was 09/21/21 -EEG was normal in May 2023 -She is sp VNS placement, setting changes today, patient tolerated procedure very well.  -Follow-up in 2 weeks, for additional VNS settings changes    No orders of the defined types were placed in this encounter.   HISTORY OF PRESENT ILLNESS: Update 05/07/21 [name] here today for follow-up. Seeing GI, had liver biopsy, was unremarkable. Had LFT 04/23/21 Alk phos 354, AST 47, ALT 69. Here today to discuss medications, on Keppra 750 mg twice daily, Lamictal 200/300 mg daily. No seizures since Dec 2021. Liver biopsy was unremarkable. GI suggesting come off Lamictal? She feels fine, denies any problem or concerns. In past couldn't tolerate Vimpat. Was on Carbamazepine and Lamictal long term for seizures, d/c carbamazepine 2021 due to elevated alk phos long term.  Trend recap: -September 2021 alkaline phosphatase 320, AST 27, ALT 39, weaned off carbamazepine, continue Lamictal 300 mg twice daily, Vimpat was added -Seizure December 2021 on Vimpat 100 mg AM/150 MG PM and Lamictal 300 mg twice daily, alkaline phosphatase was 253, ALT 48, AST 35, had side effect of Vimpat, Keppra was added 750 mg twice daily -I saw her in March 2022, alkaline phosphatase was 305, AST 43, ALT 55 on Keppra 750 mg twice daily, Lamictal 300 mg twice daily, Lamictal level was 21.4 -Saw Dr. Jannifer Franklin in September 2022, alkaline phosphatase was 361, AST 42, ALT 46, on Keppra 750 mg twice daily, Lamictal 300 mg twice daily, was referred to GI, Lamictal level was  reduced 200/300 mg daily since Lamictal level was 29.4  July 04, 2021 SS: EEG was normal 05/14/21, last office visit 05/07/21 discontinuing Lamictal, increase Keppra 1500 mg twice a day. On 06/28/21 were out of town, in the morning, sitting on bed, told mom her stomach hurt, she had seizure 5 minutes later, was tonic clonic, she fell in between the bed and nightstand, bruised her back and arm. Was making gurgling noises. Had about 4 minutes of shaking, called EMS, was post-ictal, agitated. Wasn't transported. Has been 20 years since this kind of seizure. No incontinence, she did bite her tongue.   October 16 2021: AC:  Patient presents today for follow-up, she is accompanied by her mother.  They reported the seizures have not been well controlled June 16 she has a generalized tonic-clonic seizure.  Onfi was added.  She had another seizure on September 9 again described as generalized tonic seizure.  At that time we added Topamax.  Currently she is on Keppra 2000 mg twice daily, Onfi 10 mg at night and Topamax 100 mg twice daily.  She does report side effect of slow thought processing, difficulty with concentrating difficulty with basic math problems. They have not discussed VNS therapy previously.   December 24 2021:  Patient presents today for follow-up, she is accompanied by her mother.  She is status post VNS placement.  Tolerated the procedure very well, she did follow-up with Dr. Kathyrn Sheriff and was told that everything is normal.  Overall she is doing well.  Denies  any seizures since last visit on October 4.  She is compliant with the medications and no side effects.   January 20 2022:  Patient presents today for, she is accompanied by her parents. She is doing well in terms of seizures.  No seizures since VNS placement. She is also tolerating the VNS very well.  Stated that she self swipe her VNS at least 3 times a day.  Denies any hoarseness or any other adverse reactions.  Overall she is doing well.   She is also compliant with her antiseizure medications.  Currently no major concerns   February 17, 2022 Patient presents today for follow-up, she is accompanied by parents.  She is doing well, no seizure or seizure-like activity.  She is compliant with her Keppra, Topamax, and Onfi.  She continues to swipe her VNS at least 3 times a day.  Again no seizures and no additional side effects.  Overall doing well  March 10 2022:  Patient presents today for follow-up, last visit was in February 5.  Today she is accompanied by her mother.  Sadly she lost her father last week.  She denies any seizure or seizure-like activity.  She is compliant with her medication.  She tolerates the VNS very well, continue to swipe it at least 3 times daily.  Denies any side effects.  March 25 2022:  Patient presents today for follow-up, she is accompanied by her mother.  Last visit was on February 26.  Since then no seizure or seizure-like activity.  She is tolerating the VNS very well.  She continues to swipe it at least 3 times daily.  No other complaints, denies any side effect from the VNS of side effect from the antiseizure medication.  April 08 2022:  Patient presents today for follow-up, she is accompanied by mother. Last visit was in March 12.  At that time, VNS settings were changed and patient tolerated procedure well.  She denies any seizures since last visit.   Overall she is doing very well and tolerating the VNS well, she swipes it a least 3 times per day   April 22 2022:  Patient presented for follow-up, she is accompanied by her mother.  Last visit was on March 26 at that time we increased the VNS setting and she tolerated procedure very well.  She denies any seizure or seizure-like activity.  She continues to swipe her VNS at least 3 times a day.  Overall she is doing very well, no other complaints.  May 06 2022:  Patient presents for follow-up, she is accompanied by her mother.  Last visit was on  April 9.  Since then she has not had any seizure or seizure-like like activity.  She is tolerating the VNS settings very well, no complaints.   REVIEW OF SYSTEMS: Out of a complete 14 system review of symptoms, the patient complains only of the following symptoms, and all other reviewed systems are negative.  See HPI  ALLERGIES: Allergies  Allergen Reactions   Sulfa Antibiotics Hives    HOME MEDICATIONS: Outpatient Medications Prior to Visit  Medication Sig Dispense Refill   acetaminophen (TYLENOL) 500 MG tablet Take 1,000 mg by mouth every 6 (six) hours as needed for moderate pain or headache.     cloBAZam (ONFI) 10 MG tablet TAKE ONE TABLET (10MG  TOTAL) BY MOUTH ATBEDTIME 30 tablet 5   ferrous sulfate (SLOW IRON) 160 (50 Fe) MG TBCR SR tablet Take 1 tablet by mouth daily.  levETIRAcetam (KEPPRA) 1000 MG tablet Take 2 tablets (2,000 mg total) by mouth 2 (two) times daily. 120 tablet 11   Multiple Vitamin (MULTIVITAMIN) tablet Take 1 tablet by mouth daily.     topiramate (TOPAMAX) 100 MG tablet Take 1 tablet (100 mg total) by mouth 2 (two) times daily. 60 tablet 11   No facility-administered medications prior to visit.    PAST MEDICAL HISTORY: Past Medical History:  Diagnosis Date   Developmental delay    mental age of 72, per mother   Elevated LFTs    Gait abnormality 04/07/2018   History of stroke    age 37   Seizures    last seizure 09/2021    PAST SURGICAL HISTORY: Past Surgical History:  Procedure Laterality Date   BRAIN SURGERY     finger biopsy Right    MINOR EXCISION OF ORAL LESION N/A 05/01/2014   Procedure: EXCISION LIP MASS;  Surgeon: Newman Pies, MD;  Location:  [facility];  Service: ENT;  Laterality: N/A;   VAGUS NERVE STIMULATOR INSERTION Left 12/10/2021   Procedure: VAGAL NERVE STIMULATOR IMPLANT;  Surgeon: Lisbeth Renshaw, MD;  Location: [facility] OR;  Service: Neurosurgery;  Laterality: Left;  3C    FAMILY HISTORY: Family History  Problem  Relation Age of Onset   Colon polyps Mother    Asthma Maternal Grandmother    Diabetes Maternal Grandmother    Heart disease Maternal Grandfather    Lung cancer Paternal Grandmother    Colon cancer Maternal Aunt     SOCIAL HISTORY: Social History   Socioeconomic History   Marital status: Single    Spouse name: Not on file   Number of children: 0   Years of education: 12   Highest education level: Not on file  Occupational History   Not on file  Tobacco Use   Smoking status: Never   Smokeless tobacco: Never  Vaping Use   Vaping Use: Never used  Substance and Sexual Activity   Alcohol use: No   Drug use: No   Sexual activity: Never    Birth control/protection: None  Other Topics Concern   Not on file  Social History Narrative   Patient lives with [name].   Patient has no children.    Patient as a high school education.   Patient is working.    Patient is single   Patient is left handed.   Patient does not drink caffeine.   Social Determinants of Health   Financial Resource Strain: Low Risk  (05/17/2020)   Overall Financial Resource Strain (CARDIA)    Difficulty of Paying Living Expenses: Not hard at all  Food Insecurity: No Food Insecurity (05/17/2020)   Hunger Vital Sign    Worried About Running Out of Food in the Last Year: Never true    Ran Out of Food in the Last Year: Never true  Transportation Needs: No Transportation Needs (05/17/2020)   PRAPARE - Administrator, Civil Service (Medical): No    Lack of Transportation (Non-Medical): No  Physical Activity: Insufficiently Active (05/17/2020)   Exercise Vital Sign    Days of Exercise per Week: 2 days    Minutes of Exercise per Session: 20 min  Stress: No Stress Concern Present (05/17/2020)   [employer] of Occupational Health - Occupational Stress Questionnaire    Feeling of Stress : Not at all  Social Connections: Moderately Integrated (05/17/2020)   Social Connection and Isolation Panel  [NHANES]    Frequency of  Communication with Friends and Family: More than three times a week    Frequency of Social Gatherings with Friends and Family: Twice a week    Attends Religious Services: More than 4 times per year    Active Member of [employer] or Organizations: Yes    Attends Banker Meetings: More than 4 times per year    Marital Status: Never married  Intimate Partner Violence: Not At Risk (05/17/2020)   Humiliation, Afraid, Rape, and Kick questionnaire    Fear of Current or Ex-Partner: No    Emotionally Abused: No    Physically Abused: No    Sexually Abused: No    PHYSICAL EXAM  Vitals:   05/06/22 1541  BP: 119/75  Pulse: 70  Weight: 182 lb 8 oz (82.8 kg)  Height:  (1.626 m)     Body mass index is 31.33 kg/m.  Generalized: Well developed, in no acute distress  Neurological examination  Mentation: Alert oriented to time, place, history is mostly provided by her mother. Follows all commands speech and language fluent. Mild psychomotor retardation.  Cranial nerve II-XII: Extraocular movements were full, visual field were full on confrontational test. Facial sensation and strength were normal. Head turning and shoulder shrug  were normal and symmetric.No oral injury noted. Motor: Good strength all extremities, slight weakness to right upper extremity, flexion of the right fingers/arm Sensory: Sensory testing is intact to soft touch on all 4 extremities. No evidence of extinction is noted.  Coordination: Cerebellar testing reveals good finger-nose-finger and heel-to-shin bilaterally.  Gait and station: Gait is slightly wide-based, slight limp on the right Reflexes: Deep tendon reflexes are symmetric but slightly increased on the right   DIAGNOSTIC DATA (LABS, IMAGING, TESTING) - I reviewed patient records, labs, notes, testing and imaging myself where available.  Lab Results  Component Value Date   WBC 9.3 12/04/2021   HGB 12.4 12/04/2021   HCT 38.2  12/04/2021   MCV 85.5 12/04/2021   PLT 405 (H) 12/04/2021      Component Value Date/Time   NA 138 12/04/2021 1028   NA 138 07/04/2021 1449   K 4.0 12/04/2021 1028   CL 107 12/04/2021 1028   CO2 20 (L) 12/04/2021 1028   GLUCOSE 92 12/04/2021 1028   BUN 14 12/04/2021 1028   BUN 8 07/04/2021 1449   CREATININE 0.85 12/04/2021 1028   CALCIUM 9.9 12/04/2021 1028   PROT 7.7 11/13/2021 1516   PROT 7.1 09/03/2021 1110   ALBUMIN 4.3 11/13/2021 1516   ALBUMIN 4.3 09/03/2021 1110   AST 46 (H) 11/13/2021 1516   ALT 63 (H) 11/13/2021 1516   ALKPHOS 210 (H) 11/13/2021 1516   BILITOT 0.2 11/13/2021 1516   BILITOT <0.2 09/03/2021 1110   GFRNONAA >60 12/04/2021 1028   GFRAA 116 10/11/2019 1516   No results found for: "CHOL", "HDL", "LDLCALC", "LDLDIRECT", "TRIG", "CHOLHDL" No results found for: "HGBA1C" No results found for: "VITAMINB12" No results found for: "TSH"    Windell Norfolk, MD 05/07/2022, 6:24 PM [facility] [address] [place], [place] [address] [phone]

## 2022-06-16 ENCOUNTER — Other Ambulatory Visit: Payer: Self-pay | Admitting: Neurology

## 2022-08-19 ENCOUNTER — Encounter: Payer: Self-pay | Admitting: Neurology

## 2022-08-19 ENCOUNTER — Ambulatory Visit (INDEPENDENT_AMBULATORY_CARE_PROVIDER_SITE_OTHER): Payer: Medicare Other | Admitting: Neurology

## 2022-08-19 VITALS — BP 116/70 | HR 60 | Ht 64.0 in | Wt 186.0 lb

## 2022-08-19 DIAGNOSIS — G40019 Localization-related (focal) (partial) idiopathic epilepsy and epileptic syndromes with seizures of localized onset, intractable, without status epilepticus: Secondary | ICD-10-CM

## 2022-08-19 DIAGNOSIS — Z9689 Presence of other specified functional implants: Secondary | ICD-10-CM | POA: Diagnosis not present

## 2022-08-19 MED ORDER — TOPIRAMATE 100 MG PO TABS: 50.0000 mg | ORAL_TABLET | Freq: Two times a day (BID) | ORAL | 11 refills | Status: DC

## 2022-08-19 NOTE — Patient Instructions (Signed)
Decrease Topiramate to 50 mg twice daily  Continue with Levetiracetam 2000 mg twice daily  Continue with Clobazam 10 mg nightly  VNS interrogated today, no changes made.  Follow up in 6 months, at that time, will most likely increase VNS settings and discontinue Topiramate if no seizures.

## 2022-08-19 NOTE — Progress Notes (Signed)
Patient: Vanessa Zuniga Date of Birth: 12-12-1985  Reason for Visit: Follow up for seizures History from: Patient, mother Primary Neurologist: Dr. Teresa Coombs   ASSESSMENT AND PLAN 37 y.o. year old female   1.  Cerebrovascular disease, right hemiparesis 2.  Intractable focal epilepsy 3.  S/P VNS placement   -Continue with higher dose Keppra 2000 mg twice a day,  Onfi 10 mg at bedtime and decrease Topamax 50 mg BID.  last seizure was 09/21/21 -EEG was normal in May 2023 -She is sp VNS placement, VNS interrogated but no changes made -Follow-up in 6 months, at that time, if no seizures, we will consider discontinuation of Topiramate.    Meds ordered this encounter  Medications   topiramate (TOPAMAX) 100 MG tablet    Sig: Take 0.5 tablets (50 mg total) by mouth 2 (two) times daily.    Dispense:  60 tablet    Refill:  11    HISTORY OF PRESENT ILLNESS: Update 05/07/21 [name] here today for follow-up. Seeing GI, had liver biopsy, was unremarkable. Had LFT 04/23/21 Alk phos 354, AST 47, ALT 69. Here today to discuss medications, on Keppra 750 mg twice daily, Lamictal 200/300 mg daily. No seizures since Dec 2021. Liver biopsy was unremarkable. GI suggesting come off Lamictal? She feels fine, denies any problem or concerns. In past couldn't tolerate Vimpat. Was on Carbamazepine and Lamictal long term for seizures, d/c carbamazepine 2021 due to elevated alk phos long term.  Trend recap: -September 2021 alkaline phosphatase 320, AST 27, ALT 39, weaned off carbamazepine, continue Lamictal 300 mg twice daily, Vimpat was added -Seizure December 2021 on Vimpat 100 mg AM/150 MG PM and Lamictal 300 mg twice daily, alkaline phosphatase was 253, ALT 48, AST 35, had side effect of Vimpat, Keppra was added 750 mg twice daily -I saw her in March 2022, alkaline phosphatase was 305, AST 43, ALT 55 on Keppra 750 mg twice daily, Lamictal 300 mg twice daily, Lamictal level was 21.4 -Saw Dr. Anne Hahn in  September 2022, alkaline phosphatase was 361, AST 42, ALT 46, on Keppra 750 mg twice daily, Lamictal 300 mg twice daily, was referred to GI, Lamictal level was reduced 200/300 mg daily since Lamictal level was 29.4  July 04, 2021 SS: EEG was normal 05/14/21, last office visit 05/07/21 discontinuing Lamictal, increase Keppra 1500 mg twice a day. On 06/28/21 were out of town, in the morning, sitting on bed, told mom her stomach hurt, she had seizure 5 minutes later, was tonic clonic, she fell in between the bed and nightstand, bruised her back and arm. Was making gurgling noises. Had about 4 minutes of shaking, called EMS, was post-ictal, agitated. Wasn't transported. Has been 20 years since this kind of seizure. No incontinence, she did bite her tongue.   October 16 2021: AC:  Patient presents today for follow-up, she is accompanied by her mother.  They reported the seizures have not been well controlled June 16 she has a generalized tonic-clonic seizure.  Onfi was added.  She had another seizure on September 9 again described as generalized tonic seizure.  At that time we added Topamax.  Currently she is on Keppra 2000 mg twice daily, Onfi 10 mg at night and Topamax 100 mg twice daily.  She does report side effect of slow thought processing, difficulty with concentrating difficulty with basic math problems. They have not discussed VNS therapy previously.   December 24 2021:  Patient presents today for follow-up, she is accompanied  by her mother.  She is status post VNS placement.  Tolerated the procedure very well, she did follow-up with Dr. Conchita Paris and was told that everything is normal.  Overall she is doing well.  Denies any seizures since last visit on October 4.  She is compliant with the medications and no side effects.   January 20 2022:  Patient presents today for, she is accompanied by her parents. She is doing well in terms of seizures.  No seizures since VNS placement. She is also tolerating the  VNS very well.  Stated that she self swipe her VNS at least 3 times a day.  Denies any hoarseness or any other adverse reactions.  Overall she is doing well.  She is also compliant with her antiseizure medications.  Currently no major concerns   February 17, 2022 Patient presents today for follow-up, she is accompanied by parents.  She is doing well, no seizure or seizure-like activity.  She is compliant with her Keppra, Topamax, and Onfi.  She continues to swipe her VNS at least 3 times a day.  Again no seizures and no additional side effects.  Overall doing well  March 10 2022:  Patient presents today for follow-up, last visit was in February 5.  Today she is accompanied by her mother.  Sadly she lost her father last week.  She denies any seizure or seizure-like activity.  She is compliant with her medication.  She tolerates the VNS very well, continue to swipe it at least 3 times daily.  Denies any side effects.  March 25 2022:  Patient presents today for follow-up, she is accompanied by her mother.  Last visit was on February 26.  Since then no seizure or seizure-like activity.  She is tolerating the VNS very well.  She continues to swipe it at least 3 times daily.  No other complaints, denies any side effect from the VNS of side effect from the antiseizure medication.  April 08 2022:  Patient presents today for follow-up, she is accompanied by mother. Last visit was in March 12.  At that time, VNS settings were changed and patient tolerated procedure well.  She denies any seizures since last visit.   Overall she is doing very well and tolerating the VNS well, she swipes it a least 3 times per day   April 22 2022:  Patient presented for follow-up, she is accompanied by her mother.  Last visit was on March 26 at that time we increased the VNS setting and she tolerated procedure very well.  She denies any seizure or seizure-like activity.  She continues to swipe her VNS at least 3 times a day.   Overall she is doing very well, no other complaints.  May 06 2022:  Patient presents for follow-up, she is accompanied by her mother.  Last visit was on April 9.  Since then she has not had any seizure or seizure-like like activity.  She is tolerating the VNS settings very well, no complaints.  August 19 2022:  Patient presents today for follow-up, she is accompanied by her mother.  Last visit was in April and since then she has not had any seizure or seizure-like activity.  She is tolerating the medications very well denies any side effect.  On occasion, her mother has reported word finding difficulties like patient knows what words to say but it is difficult for the words to come out.  Again no other complaints.    REVIEW OF SYSTEMS: Out of  a complete 14 system review of symptoms, the patient complains only of the following symptoms, and all other reviewed systems are negative.  See HPI  ALLERGIES: Allergies  Allergen Reactions   Sulfa Antibiotics Hives    HOME MEDICATIONS: Outpatient Medications Prior to Visit  Medication Sig Dispense Refill   acetaminophen (TYLENOL) 500 MG tablet Take 1,000 mg by mouth every 6 (six) hours as needed for moderate pain or headache.     cloBAZam (ONFI) 10 MG tablet TAKE ONE TABLET (10MG  TOTAL) BY MOUTH ATBEDTIME 30 tablet 5   ferrous sulfate (SLOW IRON) 160 (50 Fe) MG TBCR SR tablet Take 1 tablet by mouth daily.     levETIRAcetam (KEPPRA) 1000 MG tablet TAKE TWO TABLETS (2000MG  TOTAL) BY MOUTHTWO TIMES DAILY 120 tablet 11   Multiple Vitamin (MULTIVITAMIN) tablet Take 1 tablet by mouth daily.     topiramate (TOPAMAX) 100 MG tablet Take 1 tablet (100 mg total) by mouth 2 (two) times daily. 60 tablet 11   No facility-administered medications prior to visit.    PAST MEDICAL HISTORY: Past Medical History:  Diagnosis Date   Developmental delay    mental age of 71, per mother   Elevated LFTs    Gait abnormality 04/07/2018   History of stroke    age  64   Seizures (HCC)    last seizure 09/2021    PAST SURGICAL HISTORY: Past Surgical History:  Procedure Laterality Date   BRAIN SURGERY     finger biopsy Right    MINOR EXCISION OF ORAL LESION N/A 05/01/2014   Procedure: EXCISION LIP MASS;  Surgeon: Newman Pies, MD;  Location: [facility];  Service: ENT;  Laterality: N/A;   VAGUS NERVE STIMULATOR INSERTION Left 12/10/2021   Procedure: VAGAL NERVE STIMULATOR IMPLANT;  Surgeon: Lisbeth Renshaw, MD;  Location: [facility];  Service: Neurosurgery;  Laterality: Left;  3C    FAMILY HISTORY: Family History  Problem Relation Age of Onset   Colon polyps Mother    Asthma Maternal Grandmother    Diabetes Maternal Grandmother    Heart disease Maternal Grandfather    Lung cancer Paternal Grandmother    Colon cancer Maternal Aunt     SOCIAL HISTORY: Social History   Socioeconomic History   Marital status: Single    Spouse name: Not on file   Number of children: 0   Years of education: 12   Highest education level: Not on file  Occupational History   Not on file  Tobacco Use   Smoking status: Never   Smokeless tobacco: Never  Vaping Use   Vaping status: Never Used  Substance and Sexual Activity   Alcohol use: No   Drug use: No   Sexual activity: Never    Birth control/protection: None  Other Topics Concern   Not on file  Social History Narrative   Patient lives with [name].   Patient has no children.    Patient as a high school education.   Patient is working.    Patient is single   Patient is left handed.   Patient does not drink caffeine.   Social Determinants of Health   Financial Resource Strain: Low Risk  (05/17/2020)   Overall Financial Resource Strain (CARDIA)    Difficulty of Paying Living Expenses: Not hard at all  Food Insecurity: Low Risk  (02/10/2022)   [facility]   Food vital sign    Within the past 12 months, you worried that your  food would run out before you  got money to buy more: Never true    Within the past 12 months, the food you bought just didn't last and you didn't have money to get more. : Never true  Transportation Needs: No Transportation Needs (02/10/2022)   [facility]   Transportation    In the past 12 months, has lack of reliable transportation kept you from medical appointments, meetings, work or from getting things needed for daily living? : No  Physical Activity: Insufficiently Active (05/17/2020)   Exercise Vital Sign    Days of Exercise per Week: 2 days    Minutes of Exercise per Session: 20 min  Stress: No Stress Concern Present (05/17/2020)   [employer] of Occupational Health - Occupational Stress Questionnaire    Feeling of Stress : Not at all  Social Connections: Moderately Integrated (05/17/2020)   Social Connection and Isolation Panel [NHANES]    Frequency of Communication with Friends and Family: More than three times a week    Frequency of Social Gatherings with Friends and Family: Twice a week    Attends Religious Services: More than 4 times per year    Active Member of [employer] or Organizations: Yes    Attends Banker Meetings: More than 4 times per year    Marital Status: Never married  Intimate Partner Violence: Not At Risk (05/17/2020)   Humiliation, Afraid, Rape, and Kick questionnaire    Fear of Current or Ex-Partner: No    Emotionally Abused: No    Physically Abused: No    Sexually Abused: No    PHYSICAL EXAM  Vitals:   08/19/22 1323  BP: 116/70  Pulse: 60  SpO2: 99%  Weight: 186 lb (84.4 kg)  Height: 5\' 4"  (1.626 m)     Body mass index is 31.93 kg/m.  Generalized: Well developed, in no acute distress  Neurological examination  Mentation: Alert oriented to time, place, history is mostly provided by her mother. Follows all commands speech and language fluent. Mild psychomotor retardation.  Cranial nerve II-XII: Extraocular movements were full, visual  field were full on confrontational test. Facial sensation and strength were normal. Head turning and shoulder shrug  were normal and symmetric.No oral injury noted. Motor: Good strength all extremities, slight weakness to right upper extremity, flexion of the right fingers/arm Sensory: Sensory testing is intact to soft touch on all 4 extremities. No evidence of extinction is noted.  Coordination: Cerebellar testing reveals good finger-nose-finger and heel-to-shin bilaterally.  Gait and station: Gait is slightly wide-based, slight limp on the right Reflexes: Deep tendon reflexes are symmetric but slightly increased on the right   DIAGNOSTIC DATA (LABS, IMAGING, TESTING) - I reviewed patient records, labs, notes, testing and imaging myself where available.  Lab Results  Component Value Date   WBC 9.3 12/04/2021   HGB 12.4 12/04/2021   HCT 38.2 12/04/2021   MCV 85.5 12/04/2021   PLT 405 (H) 12/04/2021      Component Value Date/Time   NA 138 12/04/2021 1028   NA 138 07/04/2021 1449   K 4.0 12/04/2021 1028   CL 107 12/04/2021 1028   CO2 20 (L) 12/04/2021 1028   GLUCOSE 92 12/04/2021 1028   BUN 14 12/04/2021 1028   BUN 8 07/04/2021 1449   CREATININE 0.85 12/04/2021 1028   CALCIUM 9.9 12/04/2021 1028   PROT 7.7 11/13/2021 1516   PROT 7.1 09/03/2021 1110   ALBUMIN 4.3 11/13/2021 1516  ALBUMIN 4.3 09/03/2021 1110   AST 46 (H) 11/13/2021 1516   ALT 63 (H) 11/13/2021 1516   ALKPHOS 210 (H) 11/13/2021 1516   BILITOT 0.2 11/13/2021 1516   BILITOT <0.2 09/03/2021 1110   GFRNONAA >60 12/04/2021 1028   GFRAA 116 10/11/2019 1516   No results found for: "CHOL", "HDL", "LDLCALC", "LDLDIRECT", "TRIG", "CHOLHDL" No results found for: "HGBA1C" No results found for: "VITAMINB12" No results found for: "TSH"    Windell Norfolk, MD 08/19/2022, 2:15 PM [facility] [address] [place], [place] [address] [phone]

## 2022-08-22 ENCOUNTER — Other Ambulatory Visit: Payer: Self-pay | Admitting: Neurology

## 2022-08-25 NOTE — Telephone Encounter (Signed)
Requested Prescriptions   Pending Prescriptions Disp Refills   cloBAZam (ONFI) 10 MG tablet [Pharmacy Med Name: CLOBAZAM 10 MG TAB] 30 tablet     Sig: TAKE ONE TABLET (10MG  TOTAL) BY MOUTH ATBEDTIME   Last seen 08/19/22, next appt 02/23/23 Dispenses   Dispensed Days Supply Quantity Provider Pharmacy  CLOBAZAM     TAB 10MG  07/21/2022 30 30 each Windell Norfolk, MD [facility] - ...  CLOBAZAM     TAB 10MG  06/27/2022 30  Windell Norfolk, MD [facility] - ...  CLOBAZAM 10 MG TAB 05/28/2022 30 30 each Windell Norfolk, MD [facility] - ...  CLOBAZAM     TAB 10MG  04/25/2022 30  Windell Norfolk, MD [facility] - ...  CLOBAZAM     TAB 10MG  03/26/2022 30  Windell Norfolk, MD [facility] - ...  CLOBAZAM     TAB 10MG  02/26/2022 30  Windell Norfolk, MD [facility] - ...  CLOBAZAM     TAB 10MG  01/24/2022 30  Glean Salvo, NP [facility] - ...  CLOBAZAM 10 MG TAB 12/30/2021 30 30 each Glean Salvo, NP [facility] - ...  CLOBAZAM     TAB 10MG  11/28/2021 30  Glean Salvo, NP [facility] - ...  CLOBAZAM 10 MG TAB 10/30/2021 30 30 each Glean Salvo, NP [facility] - ...  CLOBAZAM 10 MG TAB 09/24/2021 30 30 each Glean Salvo, NP [facility] - ...  CLOBAZAM 10 MG TAB 09/23/2021 30 30 each Glean Salvo, NP [facility] - ...  CLOBAZAM 10 MG TAB 08/31/2021 30 30 each Glean Salvo, NP [facility] - .Marland KitchenMarland Kitchen

## 2022-10-14 ENCOUNTER — Ambulatory Visit (INDEPENDENT_AMBULATORY_CARE_PROVIDER_SITE_OTHER): Payer: Medicare Other | Admitting: Family Medicine

## 2022-10-14 ENCOUNTER — Encounter: Payer: Self-pay | Admitting: Family Medicine

## 2022-10-14 VITALS — BP 112/75 | HR 77 | Ht 64.0 in | Wt 180.0 lb

## 2022-10-14 DIAGNOSIS — K739 Chronic hepatitis, unspecified: Secondary | ICD-10-CM | POA: Diagnosis not present

## 2022-10-14 DIAGNOSIS — G40209 Localization-related (focal) (partial) symptomatic epilepsy and epileptic syndromes with complex partial seizures, not intractable, without status epilepticus: Secondary | ICD-10-CM

## 2022-10-14 DIAGNOSIS — Z114 Encounter for screening for human immunodeficiency virus [HIV]: Secondary | ICD-10-CM

## 2022-10-14 DIAGNOSIS — R7301 Impaired fasting glucose: Secondary | ICD-10-CM

## 2022-10-14 DIAGNOSIS — Z1159 Encounter for screening for other viral diseases: Secondary | ICD-10-CM

## 2022-10-14 DIAGNOSIS — E038 Other specified hypothyroidism: Secondary | ICD-10-CM

## 2022-10-14 DIAGNOSIS — E7849 Other hyperlipidemia: Secondary | ICD-10-CM

## 2022-10-14 DIAGNOSIS — E559 Vitamin D deficiency, unspecified: Secondary | ICD-10-CM

## 2022-10-14 NOTE — Patient Instructions (Addendum)
I appreciate the opportunity to provide care to you today!    Follow up:  4 months  Labs: please stop by the lab today to get your blood drawn (CBC, CMP, TSH, Lipid profile, HgA1c, Vit D)  Screening: HIV and Hep C  Please schedule medicare annual wellness   Please continue to a heart-healthy diet and increase your physical activities. Try to exercise for at least five days a week.    It was a pleasure to see you and I look forward to continuing to work together on your health and well-being. Please do not hesitate to call the office if you need care or have questions about your care.  In case of emergency, please visit the Emergency Department for urgent care, or contact our clinic at [phone] to schedule an appointment. We're here to help you!   Have a wonderful day and week. With Gratitude, Gilmore Laroche MSN, FNP-BC

## 2022-10-14 NOTE — Assessment & Plan Note (Addendum)
The patient is currently under the care of neurology and is taking Keppra 2000 mg twice daily, Topamax 50 mg twice daily, and clobazam 10 mg daily. There have been no recent seizure episodes reported. I encouraged her to continue following up with neurology as scheduled to monitor her treatment and progress.

## 2022-10-14 NOTE — Assessment & Plan Note (Addendum)
The patient is following up at [facility] in [place] for chronic hepatitis and elevated liver enzymes. She was started on ursodiol 300 mg BID in May 2024 and is tolerating the medication well, with no reported side effects. Recent lab results were stable, showing a decrease in alkaline phosphatase levels, and both AST and ALT were within desired limits.  I encouraged the patient to continue her treatment regimen and to follow up with [facility] in [place] as scheduled.

## 2022-10-14 NOTE — Progress Notes (Signed)
New Patient Office Visit  Subjective:  Patient ID: Vanessa Zuniga, female    DOB: 06-25-1985  Age: 37 y.o. MRN: [id number]  CC:  Chief Complaint  Patient presents with   Establish Care    New patient establishing care    HPI Vanessa Zuniga is a 37 y.o. female with past medical history of seizure and drug-induced hepatitis developmentally delay, presents for establishing care.  The patient is seen in the clinic with her mother who is her primary and legal guardian. for the details of today's visit, please refer to the assessment and plan.      Past Medical History:  Diagnosis Date   Developmental delay    mental age of 54, per mother   Elevated LFTs    Gait abnormality 04/07/2018   History of stroke    age 65   Seizures (HCC)    last seizure 09/2021    Past Surgical History:  Procedure Laterality Date   BRAIN SURGERY     finger biopsy Right    MINOR EXCISION OF ORAL LESION N/A 05/01/2014   Procedure: EXCISION LIP MASS;  Surgeon: Newman Pies, MD;  Location: [facility];  Service: ENT;  Laterality: N/A;   VAGUS NERVE STIMULATOR INSERTION Left 12/10/2021   Procedure: VAGAL NERVE STIMULATOR IMPLANT;  Surgeon: Lisbeth Renshaw, MD;  Location: [facility];  Service: Neurosurgery;  Laterality: Left;  3C    Family History  Problem Relation Age of Onset   Colon polyps Mother    Asthma Maternal Grandmother    Diabetes Maternal Grandmother    Heart disease Maternal Grandfather    Lung cancer Paternal Grandmother    Colon cancer Maternal Aunt     Social History   Socioeconomic History   Marital status: Single    Spouse name: Not on file   Number of children: 0   Years of education: 12   Highest education level: Not on file  Occupational History   Not on file  Tobacco Use   Smoking status: Never   Smokeless tobacco: Never  Vaping Use   Vaping status: Never Used  Substance and Sexual Activity   Alcohol use: No   Drug use: No   Sexual activity: Never    Birth  control/protection: None  Other Topics Concern   Not on file  Social History Narrative   Patient lives with [name].   Patient has no children.    Patient as a high school education.   Patient is working.    Patient is single   Patient is left handed.   Patient does not drink caffeine.   Social Determinants of Health   Financial Resource Strain: Low Risk  (05/17/2020)   Overall Financial Resource Strain (CARDIA)    Difficulty of Paying Living Expenses: Not hard at all  Food Insecurity: Low Risk  (02/10/2022)   [facility]   Hunger Vital Sign    Worried About Running Out of Food in the Last Year: Never true    Ran Out of Food in the Last Year: Never true  Transportation Needs: No Transportation Needs (02/10/2022)   [facility]   Transportation    In the past 12 months, has lack of reliable transportation kept you from medical appointments, meetings, work or from getting things needed for daily living? : No  Physical Activity: Insufficiently Active (05/17/2020)   Exercise Vital Sign    Days of Exercise per Week:  2 days    Minutes of Exercise per Session: 20 min  Stress: No Stress Concern Present (05/17/2020)   [employer] of Occupational Health - Occupational Stress Questionnaire    Feeling of Stress : Not at all  Social Connections: Moderately Integrated (05/17/2020)   Social Connection and Isolation Panel [NHANES]    Frequency of Communication with Friends and Family: More than three times a week    Frequency of Social Gatherings with Friends and Family: Twice a week    Attends Religious Services: More than 4 times per year    Active Member of [employer] or Organizations: Yes    Attends Banker Meetings: More than 4 times per year    Marital Status: Never married  Intimate Partner Violence: Not At Risk (05/17/2020)   Humiliation, Afraid, Rape, and Kick questionnaire    Fear of Current or Ex-Partner: No     Emotionally Abused: No    Physically Abused: No    Sexually Abused: No    ROS Review of Systems  Constitutional:  Negative for chills and fever.  Eyes:  Negative for visual disturbance.  Respiratory:  Negative for chest tightness and shortness of breath.   Neurological:  Negative for dizziness and headaches.    Objective:   Today's Vitals: BP 112/75   Pulse 77   Ht 5\' 4"  (1.626 m)   Wt 180 lb 0.6 oz (81.7 kg)   SpO2 99%   BMI 30.90 kg/m   Physical Exam   Assessment & Plan:   Partial epilepsy with impairment of consciousness (HCC) Assessment & Plan: The patient is currently under the care of neurology and is taking Keppra 2000 mg twice daily, Topamax 50 mg twice daily, and clobazam 10 mg daily. There have been no recent seizure episodes reported. I encouraged her to continue following up with neurology as scheduled to monitor her treatment and progress.   Chronic hepatitis [facility]) Assessment & Plan: The patient is following up at [facility] in [place] for chronic hepatitis and elevated liver enzymes. She was started on ursodiol 300 mg BID in May 2024 and is tolerating the medication well, with no reported side effects. Recent lab results were stable, showing a decrease in alkaline phosphatase levels, and both AST and ALT were within desired limits.  I encouraged the patient to continue her treatment regimen and to follow up with [facility] in [place] as scheduled.   IFG (impaired fasting glucose) -     Hemoglobin A1c  Vitamin D deficiency -     VITAMIN D 25 Hydroxy (Vit-D Deficiency, Fractures)  Need for hepatitis C screening test -     Hepatitis C antibody  Encounter for screening for HIV -     HIV Antibody (routine testing w rflx)  Other specified hypothyroidism -     TSH + free T4  Other hyperlipidemia -     Lipid panel -     CMP14+EGFR -     CBC with Differential/Platelet    Note: This chart has been completed using Engineer, civil (consulting) software, and while attempts have been made to ensure accuracy, certain words and phrases may not be transcribed as intended.    Follow-up: Return in about 4 months (around 02/14/2023).   Gilmore Laroche, FNP

## 2022-10-16 LAB — CBC WITH DIFFERENTIAL/PLATELET
Basophils Absolute: 0.1 10*3/uL (ref 0.0–0.2)
Basos: 1 %
EOS (ABSOLUTE): 0.1 10*3/uL (ref 0.0–0.4)
Eos: 2 %
Hematocrit: 37 % (ref 34.0–46.6)
Hemoglobin: 11.9 g/dL (ref 11.1–15.9)
Immature Grans (Abs): 0 10*3/uL (ref 0.0–0.1)
Immature Granulocytes: 0 %
Lymphocytes Absolute: 1.8 10*3/uL (ref 0.7–3.1)
Lymphs: 32 %
MCH: 27.5 pg (ref 26.6–33.0)
MCHC: 32.2 g/dL (ref 31.5–35.7)
MCV: 86 fL (ref 79–97)
Monocytes Absolute: 0.4 10*3/uL (ref 0.1–0.9)
Monocytes: 6 %
Neutrophils Absolute: 3.3 10*3/uL (ref 1.4–7.0)
Neutrophils: 59 %
Platelets: 360 10*3/uL (ref 150–450)
RBC: 4.33 x10E6/uL (ref 3.77–5.28)
RDW: 11.6 % — ABNORMAL LOW (ref 11.7–15.4)
WBC: 5.6 10*3/uL (ref 3.4–10.8)

## 2022-10-16 LAB — HEPATITIS C ANTIBODY: Hep C Virus Ab: NONREACTIVE

## 2022-10-16 LAB — CMP14+EGFR
ALT: 19 [IU]/L (ref 0–32)
AST: 22 [IU]/L (ref 0–40)
Albumin: 4.1 g/dL (ref 3.9–4.9)
Alkaline Phosphatase: 148 [IU]/L — ABNORMAL HIGH (ref 44–121)
BUN/Creatinine Ratio: 18 (ref 9–23)
BUN: 16 mg/dL (ref 6–20)
Bilirubin Total: <0.2 mg/dL (ref 0.0–1.2)
CO2: 20 mmol/L (ref 20–29)
Calcium: 9.4 mg/dL (ref 8.7–10.2)
Chloride: 107 mmol/L — ABNORMAL HIGH (ref 96–106)
Creatinine, Ser: 0.91 mg/dL (ref 0.57–1.00)
Globulin, Total: 3 g/dL (ref 1.5–4.5)
Glucose: 90 mg/dL (ref 70–99)
Potassium: 3.8 mmol/L (ref 3.5–5.2)
Sodium: 142 mmol/L (ref 134–144)
Total Protein: 7.1 g/dL (ref 6.0–8.5)
eGFR: 83 mL/min/{1.73_m2} (ref >59)

## 2022-10-16 LAB — LIPID PANEL
Chol/HDL Ratio: 3.4 {ratio} (ref 0.0–4.4)
Cholesterol, Total: 200 mg/dL — ABNORMAL HIGH (ref 100–199)
HDL: 59 mg/dL (ref >39)
LDL Chol Calc (NIH): 122 mg/dL — ABNORMAL HIGH (ref 0–99)
Triglycerides: 107 mg/dL (ref 0–149)
VLDL Cholesterol Cal: 19 mg/dL (ref 5–40)

## 2022-10-16 LAB — HEMOGLOBIN A1C
Est. average glucose Bld gHb Est-mCnc: 120 mg/dL
Hgb A1c MFr Bld: 5.8 % — ABNORMAL HIGH (ref 4.8–5.6)

## 2022-10-16 LAB — TSH+FREE T4
Free T4: 0.97 ng/dL (ref 0.82–1.77)
TSH: 1.16 u[IU]/mL (ref 0.450–4.500)

## 2022-10-16 LAB — VITAMIN D 25 HYDROXY (VIT D DEFICIENCY, FRACTURES): Vit D, 25-Hydroxy: 47.5 ng/mL (ref 30.0–100.0)

## 2022-10-16 LAB — HIV ANTIBODY (ROUTINE TESTING W REFLEX): HIV Screen 4th Generation wRfx: NONREACTIVE

## 2022-10-21 NOTE — Progress Notes (Signed)
Please inform the patient that her lab results indicate she is prediabetic. I recommend decreasing her intake of high-sugar foods and beverages while increasing her physical activity. Additionally, her cholesterol levels are elevated, so I advise reducing her intake of greasy, fatty, and starchy foods, along with increasing physical activity. My goal is for her LDL to be less than 100. All other lab results are stable.

## 2023-02-14 ENCOUNTER — Other Ambulatory Visit: Payer: Self-pay | Admitting: Neurology

## 2023-02-16 ENCOUNTER — Ambulatory Visit (INDEPENDENT_AMBULATORY_CARE_PROVIDER_SITE_OTHER): Payer: Medicare Other | Admitting: Family Medicine

## 2023-02-16 ENCOUNTER — Encounter: Payer: Self-pay | Admitting: Family Medicine

## 2023-02-16 VITALS — BP 112/76 | HR 63 | Ht 64.0 in | Wt 176.0 lb

## 2023-02-16 DIAGNOSIS — E785 Hyperlipidemia, unspecified: Secondary | ICD-10-CM | POA: Diagnosis not present

## 2023-02-16 DIAGNOSIS — E559 Vitamin D deficiency, unspecified: Secondary | ICD-10-CM

## 2023-02-16 DIAGNOSIS — G40209 Localization-related (focal) (partial) symptomatic epilepsy and epileptic syndromes with complex partial seizures, not intractable, without status epilepticus: Secondary | ICD-10-CM

## 2023-02-16 DIAGNOSIS — K739 Chronic hepatitis, unspecified: Secondary | ICD-10-CM | POA: Diagnosis not present

## 2023-02-16 DIAGNOSIS — R7303 Prediabetes: Secondary | ICD-10-CM

## 2023-02-16 DIAGNOSIS — E038 Other specified hypothyroidism: Secondary | ICD-10-CM

## 2023-02-16 DIAGNOSIS — R7301 Impaired fasting glucose: Secondary | ICD-10-CM

## 2023-02-16 NOTE — Progress Notes (Signed)
Established Patient Office Visit  Subjective:  Patient ID: Vanessa Zuniga, female    DOB: December 08, 1985  Age: 38 y.o. MRN: [id number]  CC:  Chief Complaint  Patient presents with   Medical Management of Chronic Issues    4 month chronic follow up :     HPI Vanessa Zuniga is a 38 y.o. female with past medical history of chronic hepatitis and partial epilepsy presents for f/u of  chronic medical conditions. The patient is in the clinic today with her mother who is her primary legal guardian. for the details of today's visit, please refer to the assessment and plan.     Past Medical History:  Diagnosis Date   Developmental delay    mental age of 17, per mother   Elevated LFTs    Gait abnormality 04/07/2018   History of stroke    age 73   Seizures (HCC)    last seizure 09/2021    Past Surgical History:  Procedure Laterality Date   BRAIN SURGERY     finger biopsy Right    MINOR EXCISION OF ORAL LESION N/A 05/01/2014   Procedure: EXCISION LIP MASS;  Surgeon: Newman Pies, MD;  Location: [facility];  Service: ENT;  Laterality: N/A;   VAGUS NERVE STIMULATOR INSERTION Left 12/10/2021   Procedure: VAGAL NERVE STIMULATOR IMPLANT;  Surgeon: Lisbeth Renshaw, MD;  Location: [facility] OR;  Service: Neurosurgery;  Laterality: Left;  3C    Family History  Problem Relation Age of Onset   Colon polyps Mother    Asthma Maternal Grandmother    Diabetes Maternal Grandmother    Heart disease Maternal Grandfather    Lung cancer Paternal Grandmother    Colon cancer Maternal Aunt     Social History   Socioeconomic History   Marital status: Single    Spouse name: Not on file   Number of children: 0   Years of education: 12   Highest education level: Not on file  Occupational History   Not on file  Tobacco Use   Smoking status: Never   Smokeless tobacco: Never  Vaping Use   Vaping status: Never Used  Substance and Sexual Activity   Alcohol use: No   Drug use: No   Sexual  activity: Never    Birth control/protection: None  Other Topics Concern   Not on file  Social History Narrative   Patient lives with mother Vanessa Zuniga.   Patient has no children.    Patient as a high school education.   Patient is working.    Patient is single   Patient is left handed.   Patient does not drink caffeine.   Social Drivers of Corporate investment banker Strain: Low Risk  (05/17/2020)   Overall Financial Resource Strain (CARDIA)    Difficulty of Paying Living Expenses: Not hard at all  Food Insecurity: Low Risk  (02/10/2022)   [facility]   Hunger Vital Sign    Worried About Running Out of Food in the Last Year: Never true    Ran Out of Food in the Last Year: Never true  Transportation Needs: No Transportation Needs (02/10/2022)   [facility]   Transportation    In the past 12 months, has lack of reliable transportation kept you from medical appointments, meetings, work or from getting things needed for daily living? : No  Physical Activity: Insufficiently Active (05/17/2020)   Exercise Vital Sign  Days of Exercise per Week: 2 days    Minutes of Exercise per Session: 20 min  Stress: No Stress Concern Present (05/17/2020)   [employer] of Occupational Health - Occupational Stress Questionnaire    Feeling of Stress : Not at all  Social Connections: Moderately Integrated (05/17/2020)   Social Connection and Isolation Panel [NHANES]    Frequency of Communication with Friends and Family: More than three times a week    Frequency of Social Gatherings with Friends and Family: Twice a week    Attends Religious Services: More than 4 times per year    Active Member of [employer] or Organizations: Yes    Attends Banker Meetings: More than 4 times per year    Marital Status: Never married  Intimate Partner Violence: Not At Risk (05/17/2020)   Humiliation, Afraid, Rape, and Kick questionnaire    Fear of  Current or Ex-Partner: No    Emotionally Abused: No    Physically Abused: No    Sexually Abused: No    Outpatient Medications Prior to Visit  Medication Sig Dispense Refill   acetaminophen (TYLENOL) 500 MG tablet Take 1,000 mg by mouth every 6 (six) hours as needed for moderate pain or headache.     cloBAZam (ONFI) 10 MG tablet TAKE ONE TABLET (10MG  TOTAL) BY MOUTH ATBEDTIME 30 tablet 5   ferrous sulfate (SLOW IRON) 160 (50 Fe) MG TBCR SR tablet Take 1 tablet by mouth daily.     levETIRAcetam (KEPPRA) 1000 MG tablet TAKE TWO TABLETS (2000MG  TOTAL) BY MOUTHTWO TIMES DAILY 120 tablet 11   Multiple Vitamin (MULTIVITAMIN) tablet Take 1 tablet by mouth daily.     topiramate (TOPAMAX) 100 MG tablet Take 0.5 tablets (50 mg total) by mouth 2 (two) times daily. 60 tablet 11   ursodiol (ACTIGALL) 300 MG capsule Take 300 mg by mouth 2 (two) times daily.     No facility-administered medications prior to visit.    Allergies  Allergen Reactions   Sulfa Antibiotics Hives    ROS Review of Systems  Constitutional:  Negative for chills and fever.  Eyes:  Negative for visual disturbance.  Respiratory:  Negative for chest tightness and shortness of breath.   Neurological:  Negative for dizziness and headaches.      Objective:    Physical Exam HENT:     Head: Normocephalic.     Mouth/Throat:     Mouth: Mucous membranes are moist.  Cardiovascular:     Rate and Rhythm: Normal rate.     Heart sounds: Normal heart sounds.  Pulmonary:     Effort: Pulmonary effort is normal.     Breath sounds: Normal breath sounds.  Neurological:     Mental Status: She is alert.     BP 112/76   Pulse 63   Ht 5\' 4"  (1.626 m)   Wt 176 lb (79.8 kg)   LMP 02/08/2023   SpO2 98%   BMI 30.21 kg/m  Wt Readings from Last 3 Encounters:  02/16/23 176 lb (79.8 kg)  10/14/22 180 lb 0.6 oz (81.7 kg)  08/19/22 186 lb (84.4 kg)    Lab Results  Component Value Date   TSH 1.160 10/15/2022   Lab Results   Component Value Date   WBC 5.6 10/15/2022   HGB 11.9 10/15/2022   HCT 37.0 10/15/2022   MCV 86 10/15/2022   PLT 360 10/15/2022   Lab Results  Component Value Date   NA 142 10/15/2022   K 3.8  10/15/2022   CO2 20 10/15/2022   GLUCOSE 90 10/15/2022   BUN 16 10/15/2022   CREATININE 0.91 10/15/2022   BILITOT <0.2 10/15/2022   ALKPHOS 148 (H) 10/15/2022   AST 22 10/15/2022   ALT 19 10/15/2022   PROT 7.1 10/15/2022   ALBUMIN 4.1 10/15/2022   CALCIUM 9.4 10/15/2022   ANIONGAP 11 12/04/2021   EGFR 83 10/15/2022   GFR 80.20 11/14/2020   Lab Results  Component Value Date   CHOL 200 (H) 10/15/2022   Lab Results  Component Value Date   HDL 59 10/15/2022   Lab Results  Component Value Date   LDLCALC 122 (H) 10/15/2022   Lab Results  Component Value Date   TRIG 107 10/15/2022   Lab Results  Component Value Date   CHOLHDL 3.4 10/15/2022   Lab Results  Component Value Date   HGBA1C 5.8 (H) 10/15/2022      Assessment & Plan:  Prediabetes Assessment & Plan: For managing prediabetes, I recommend the following lifestyle changes:  Reduce Intake of High-Sugar Foods and Beverages: Limit foods and drinks high in sugar to help regulate blood sugar levels. Increase Consumption of Nutrient-Rich Foods: Focus on incorporating more fruits, vegetables, and whole grains into your diet. Choose Lean Proteins: Opt for lean proteins such as chicken, fish, beans, and legumes. Select Low-Fat Dairy Products: Choose low-fat or non-fat dairy options. Minimize Saturated Fats, Trans Fats, and Cholesterol: Reduce intake of foods high in saturated fats, trans fatty acids, and cholesterol. Engage in Regular Physical Activity: Aim for at least 30 minutes of brisk walking or other moderate activity at least 5 days a week.  Lifestyle modifications for prediabetes were discussed, including adopting a heart-healthy diet and increasing physical activity. The patient was encouraged to decrease her intake  of high-sugar foods and beverages. She verbalized understanding and is aware of the plan of care.    Hyperlipidemia LDL goal <100 Assessment & Plan: The patient was encouraged to make lifestyle changes, including avoiding simple carbohydrates such as cakes, sweet desserts, ice cream, soda (diet or regular), sweet tea, candies, chips, cookies, store-bought juices, excessive alcohol (more than 1-2 drinks per day), lemonade, artificial sweeteners, donuts, coffee creamers, and sugar-free products. Additionally, reducing the consumption of greasy, fatty foods and increasing physical activity were recommended. The patient verbalized understanding and is aware of the plan of care.    Orders: -     Lipid panel -     CMP14+EGFR -     CBC with Differential/Platelet  Chronic hepatitis [facility]) Assessment & Plan: The patient is following up at [facility] in [place] for chronic hepatitis and elevated liver enzymes.  She takes ursodiol 300 mg BID and is tolerating the medication well, with no reported side effects. Recent lab results were stable, showing a decrease in alkaline phosphatase levels, and both AST and ALT were within desired limits.  I encouraged the patient to continue her treatment regimen and to follow up with [facility] in [place] as scheduled.   Partial epilepsy with impairment of consciousness [facility]) Assessment & Plan: The patient is currently under the care of neurology and is taking Keppra 2000 mg twice daily, Topamax 50 mg twice daily, and clobazam 10 mg daily. There have been no recent seizure episodes reported. I encouraged her to continue following up with neurology as scheduled to monitor her treatment and progress.   IFG (impaired fasting glucose) -     Hemoglobin  A1c  Vitamin D deficiency -     VITAMIN D 25 Hydroxy (Vit-D Deficiency, Fractures)  TSH (thyroid-stimulating hormone deficiency) -      TSH + free T4  Note: This chart has been completed using Engineer, civil (consulting) software, and while attempts have been made to ensure accuracy, certain words and phrases may not be transcribed as intended.    Follow-up: Return in about 5 months (around 07/16/2023).   Gilmore Laroche, FNP

## 2023-02-16 NOTE — Patient Instructions (Addendum)
I appreciate the opportunity to provide care to you today!    Follow up:  5 months  Labs: please stop by the lab during the week to get your blood drawn (CBC, CMP, TSH, Lipid profile, HgA1c, Vit D)   Here are some foods to avoid or reduce in your diet to help manage cholesterol levels:  Fried Foods:Deep-fried items such as french fries, fried chicken, and fried snacks are high in unhealthy fats and can raise LDL (bad) cholesterol levels. Processed Meats:Foods like bacon, sausage, hot dogs, and deli meats are often high in saturated fat and cholesterol. Full-Fat Dairy Products:Whole milk, full-fat yogurt, butter, cream, and cheese are rich in saturated fats, which can increase cholesterol levels. Baked Goods and Sweets:Pastries, cakes, cookies, and donuts often contain trans fats and added sugars, which can raise LDL cholesterol and lower HDL (good) cholesterol. Red Meat:Beef, lamb, and pork are high in saturated fat. Lean cuts or plant-based protein alternatives are better options. Lard and Shortening:Used in some baked goods, lard and shortening are high in trans fats and should be avoided. Fast Food:Many fast food items are cooked with unhealthy oils and contain high amounts of saturated and trans fats. Processed Snacks:Chips, crackers, and certain microwave popcorns can contain trans fats and high levels of unhealthy oils. Shellfish:While nutritious in other ways, some shellfish like shrimp, lobster, and crab are high in cholesterol. They should be consumed in moderation. Coconut and Palm Oils:these oils are high in saturated fat and can raise cholesterol levels when used in cooking or baking.       Please continue to a heart-healthy diet and increase your physical activities. Try to exercise for at least five days a week.    It was a pleasure to see you and I look forward to continuing to work together on your health and well-being. Please do not hesitate to call the office if  you need care or have questions about your care.  In case of emergency, please visit the Emergency Department for urgent care, or contact our clinic at [phone] to schedule an appointment. We're here to help you!   Have a wonderful day and week. With Gratitude, Gilmore Laroche MSN, FNP-BC

## 2023-02-16 NOTE — Telephone Encounter (Signed)
Last seen 08/19/22, next appt 02/23/23  Dispenses    Dispensed Days Supply Quantity Provider Pharmacy  CLOBAZAM     TAB 10MG  01/16/2023 30 30 each Windell Norfolk, MD [facility] - ...  CLOBAZAM     TAB 10MG  12/17/2022 30 30 each Windell Norfolk, MD [facility] - ...  CLOBAZAM     TAB 10MG  11/17/2022 30 30 each Windell Norfolk, MD [facility] - ...  CLOBAZAM     TAB 10MG  10/20/2022 30 30 each Windell Norfolk, MD [facility] - ...  CLOBAZAM     TAB 10MG  09/22/2022 30 30 each Windell Norfolk, MD [facility] - ...  CLOBAZAM     TAB 10MG  08/25/2022 30 30 each Windell Norfolk, MD [facility] - ...  CLOBAZAM     TAB 10MG  07/21/2022 30 30 each Windell Norfolk, MD [facility] - ...  CLOBAZAM     TAB 10MG  06/27/2022 30  Windell Norfolk, MD [facility] - ...  CLOBAZAM 10 MG TAB 05/28/2022 30 30 each Windell Norfolk, MD [facility] - ...  CLOBAZAM     TAB 10MG  04/25/2022 30  Windell Norfolk, MD [facility] - ...  CLOBAZAM     TAB 10MG  03/26/2022 30  Windell Norfolk, MD [facility] - ...  CLOBAZAM     TAB 10MG  02/26/2022 30  Camara, Amalia Hailey, MD  PHARMACY - [facility]

## 2023-02-16 NOTE — Assessment & Plan Note (Signed)
 For managing prediabetes, I recommend the following lifestyle changes:  Reduce Intake of High-Sugar Foods and Beverages: Limit foods and drinks high in sugar to help regulate blood sugar levels. Increase Consumption of Nutrient-Rich Foods: Focus on incorporating more fruits, vegetables, and whole grains into your diet. Choose Lean Proteins: Opt for lean proteins such as chicken, fish, beans, and legumes. Select Low-Fat Dairy Products: Choose low-fat or non-fat dairy options. Minimize Saturated Fats, Trans Fats, and Cholesterol: Reduce intake of foods high in saturated fats, trans fatty acids, and cholesterol. Engage in Regular Physical Activity: Aim for at least 30 minutes of brisk walking or other moderate activity at least 5 days a week.  Lifestyle modifications for prediabetes were discussed, including adopting a heart-healthy diet and increasing physical activity. The patient was encouraged to decrease her intake of high-sugar foods and beverages. She verbalized understanding and is aware of the plan of care.

## 2023-02-16 NOTE — Assessment & Plan Note (Signed)
The patient is following up at [facility] in [place] for chronic hepatitis and elevated liver enzymes.  She takes ursodiol 300 mg BID and is tolerating the medication well, with no reported side effects. Recent lab results were stable, showing a decrease in alkaline phosphatase levels, and both AST and ALT were within desired limits.  I encouraged the patient to continue her treatment regimen and to follow up with [facility] in [place] as scheduled.

## 2023-02-16 NOTE — Assessment & Plan Note (Signed)
The patient is currently under the care of neurology and is taking Keppra 2000 mg twice daily, Topamax 50 mg twice daily, and clobazam 10 mg daily. There have been no recent seizure episodes reported. I encouraged her to continue following up with neurology as scheduled to monitor her treatment and progress.

## 2023-02-16 NOTE — Assessment & Plan Note (Signed)
 The patient was encouraged to make lifestyle changes, including avoiding simple carbohydrates such as cakes, sweet desserts, ice cream, soda (diet or regular), sweet tea, candies, chips, cookies, store-bought juices, excessive alcohol (more than 1-2 drinks per day), lemonade, artificial sweeteners, donuts, coffee creamers, and sugar-free products. Additionally, reducing the consumption of greasy, fatty foods and increasing physical activity were recommended. The patient verbalized understanding and is aware of the plan of care.

## 2023-02-18 LAB — CBC WITH DIFFERENTIAL/PLATELET

## 2023-02-19 LAB — CMP14+EGFR
ALT: 20 [IU]/L (ref 0–32)
AST: 21 [IU]/L (ref 0–40)
Albumin: 4.4 g/dL (ref 3.9–4.9)
Alkaline Phosphatase: 155 [IU]/L — ABNORMAL HIGH (ref 44–121)
BUN/Creatinine Ratio: 17 (ref 9–23)
BUN: 16 mg/dL (ref 6–20)
Bilirubin Total: <0.2 mg/dL (ref 0.0–1.2)
Calcium: 9.6 mg/dL (ref 8.7–10.2)
Chloride: 109 mmol/L — ABNORMAL HIGH (ref 96–106)
Creatinine, Ser: 0.92 mg/dL (ref 0.57–1.00)
Globulin, Total: 2.8 g/dL (ref 1.5–4.5)
Glucose: 88 mg/dL (ref 70–99)
Potassium: 4.4 mmol/L (ref 3.5–5.2)
Sodium: 142 mmol/L (ref 134–144)
Total Protein: 7.2 g/dL (ref 6.0–8.5)
eGFR: 82 mL/min/{1.73_m2} (ref >59)

## 2023-02-19 LAB — LIPID PANEL
Chol/HDL Ratio: 3 {ratio} (ref 0.0–4.4)
Cholesterol, Total: 203 mg/dL — ABNORMAL HIGH (ref 100–199)
HDL: 67 mg/dL (ref >39)
LDL Chol Calc (NIH): 117 mg/dL — ABNORMAL HIGH (ref 0–99)
Triglycerides: 109 mg/dL (ref 0–149)
VLDL Cholesterol Cal: 19 mg/dL (ref 5–40)

## 2023-02-19 LAB — TSH+FREE T4
Free T4: 0.94 ng/dL (ref 0.82–1.77)
TSH: 1 u[IU]/mL (ref 0.450–4.500)

## 2023-02-19 LAB — HEMOGLOBIN A1C
Est. average glucose Bld gHb Est-mCnc: 111 mg/dL
Hgb A1c MFr Bld: 5.5 % (ref 4.8–5.6)

## 2023-02-19 LAB — VITAMIN D 25 HYDROXY (VIT D DEFICIENCY, FRACTURES): Vit D, 25-Hydroxy: 51 ng/mL (ref 30.0–100.0)

## 2023-02-19 LAB — CBC WITH DIFFERENTIAL/PLATELET

## 2023-02-22 ENCOUNTER — Encounter: Payer: Self-pay | Admitting: Family Medicine

## 2023-02-23 ENCOUNTER — Ambulatory Visit (INDEPENDENT_AMBULATORY_CARE_PROVIDER_SITE_OTHER): Payer: Medicare Other | Admitting: Neurology

## 2023-02-23 ENCOUNTER — Encounter: Payer: Self-pay | Admitting: Neurology

## 2023-02-23 VITALS — BP 118/74 | HR 63 | Ht 64.0 in | Wt 171.5 lb

## 2023-02-23 DIAGNOSIS — G40019 Localization-related (focal) (partial) idiopathic epilepsy and epileptic syndromes with seizures of localized onset, intractable, without status epilepticus: Secondary | ICD-10-CM

## 2023-02-23 DIAGNOSIS — Z9689 Presence of other specified functional implants: Secondary | ICD-10-CM | POA: Diagnosis not present

## 2023-02-23 DIAGNOSIS — Z8673 Personal history of transient ischemic attack (TIA), and cerebral infarction without residual deficits: Secondary | ICD-10-CM | POA: Diagnosis not present

## 2023-02-23 NOTE — Patient Instructions (Signed)
 Decrease topiramate  to 50 mg nightly for a month then discontinue medication  Continue with Keppra  2000 mg twice daily  Continue with Clobazam  10 mg nightly  Return in a year or sooner if worse

## 2023-02-23 NOTE — Progress Notes (Signed)
 Patient: NEYRA HAJDU Date of Birth: 1985/03/07  Reason for Visit: Follow up for seizures History from: Patient, mother Primary Neurologist: Dr. Samara Crest   ASSESSMENT AND PLAN 38 y.o. year old female   1.  Cerebrovascular disease, right hemiparesis 2.  Intractable focal epilepsy 3.  S/P VNS placement   -Continue with higher dose Keppra  2000 mg twice a day,  Onfi  10 mg at bedtime and decrease Topamax  50 mg nightly for one month, then stop the medication.  Last seizure was 09/21/21 -EEG was normal in May 2023 -She is sp VNS placement, VNS not interrogated today -Follow-up in 1 year, at that time, if no seizures, we will consider decreasing Clobazam .     No orders of the defined types were placed in this encounter.   HISTORY OF PRESENT ILLNESS: Update 05/07/21 SS: Jil Mosses here today for follow-up. Seeing GI, had liver biopsy, was unremarkable. Had LFT 04/23/21 Alk phos 354, AST 47, ALT 69. Here today to discuss medications, on Keppra  750 mg twice daily, Lamictal  200/300 mg daily. No seizures since Dec 2021. Liver biopsy was unremarkable. GI suggesting come off Lamictal ? She feels fine, denies any problem or concerns. In past couldn't tolerate Vimpat . Was on Carbamazepine  and Lamictal  long term for seizures, d/c carbamazepine  2021 due to elevated alk phos long term.  Trend recap: -September 2021 alkaline phosphatase 320, AST 27, ALT 39, weaned off carbamazepine , continue Lamictal  300 mg twice daily, Vimpat  was added -Seizure December 2021 on Vimpat  100 mg AM/150 MG PM and Lamictal  300 mg twice daily, alkaline phosphatase was 253, ALT 48, AST 35, had side effect of Vimpat , Keppra  was added 750 mg twice daily -I saw her in March 2022, alkaline phosphatase was 305, AST 43, ALT 55 on Keppra  750 mg twice daily, Lamictal  300 mg twice daily, Lamictal  level was 21.4 -Saw Dr. Tilda Fogo in September 2022, alkaline phosphatase was 361, AST 42, ALT 46, on Keppra  750 mg twice daily, Lamictal  300 mg twice  daily, was referred to GI, Lamictal  level was reduced 200/300 mg daily since Lamictal  level was 29.4  July 04, 2021 SS: EEG was normal 05/14/21, last office visit 05/07/21 discontinuing Lamictal , increase Keppra  1500 mg twice a day. On 06/28/21 were out of town, in the morning, sitting on bed, told mom her stomach hurt, she had seizure 5 minutes later, was tonic clonic, she fell in between the bed and nightstand, bruised her back and arm. Was making gurgling noises. Had about 4 minutes of shaking, called EMS, was post-ictal, agitated. Wasn't transported. Has been 20 years since this kind of seizure. No incontinence, she did bite her tongue.   October 16 2021: AC:  Patient presents today for follow-up, she is accompanied by her mother.  They reported the seizures have not been well controlled June 16 she has a generalized tonic-clonic seizure.  Onfi  was added.  She had another seizure on September 9 again described as generalized tonic seizure.  At that time we added Topamax .  Currently she is on Keppra  2000 mg twice daily, Onfi  10 mg at night and Topamax  100 mg twice daily.  She does report side effect of slow thought processing, difficulty with concentrating difficulty with basic math problems. They have not discussed VNS therapy previously.   December 24 2021:  Patient presents today for follow-up, she is accompanied by her mother.  She is status post VNS placement.  Tolerated the procedure very well, she did follow-up with Dr. Nat Badger and was told that everything  is normal.  Overall she is doing well.  Denies any seizures since last visit on October 4.  She is compliant with the medications and no side effects.   January 20 2022:  Patient presents today for, she is accompanied by her parents. She is doing well in terms of seizures.  No seizures since VNS placement. She is also tolerating the VNS very well.  Stated that she self swipe her VNS at least 3 times a day.  Denies any hoarseness or any other  adverse reactions.  Overall she is doing well.  She is also compliant with her antiseizure medications.  Currently no major concerns   February 17, 2022 Patient presents today for follow-up, she is accompanied by parents.  She is doing well, no seizure or seizure-like activity.  She is compliant with her Keppra , Topamax , and Onfi .  She continues to swipe her VNS at least 3 times a day.  Again no seizures and no additional side effects.  Overall doing well  March 10 2022:  Patient presents today for follow-up, last visit was in February 5.  Today she is accompanied by her mother.  Sadly she lost her father last week.  She denies any seizure or seizure-like activity.  She is compliant with her medication.  She tolerates the VNS very well, continue to swipe it at least 3 times daily.  Denies any side effects.  March 25 2022:  Patient presents today for follow-up, she is accompanied by her mother.  Last visit was on February 26.  Since then no seizure or seizure-like activity.  She is tolerating the VNS very well.  She continues to swipe it at least 3 times daily.  No other complaints, denies any side effect from the VNS of side effect from the antiseizure medication.  April 08 2022:  Patient presents today for follow-up, she is accompanied by mother. Last visit was in March 12.  At that time, VNS settings were changed and patient tolerated procedure well.  She denies any seizures since last visit.   Overall she is doing very well and tolerating the VNS well, she swipes it a least 3 times per day   April 22 2022:  Patient presented for follow-up, she is accompanied by her mother.  Last visit was on March 26 at that time we increased the VNS setting and she tolerated procedure very well.  She denies any seizure or seizure-like activity.  She continues to swipe her VNS at least 3 times a day.  Overall she is doing very well, no other complaints.  May 06 2022:  Patient presents for follow-up, she is  accompanied by her mother.  Last visit was on April 9.  Since then she has not had any seizure or seizure-like like activity.  She is tolerating the VNS settings very well, no complaints.  August 19 2022:  Patient presents today for follow-up, she is accompanied by her mother.  Last visit was in April and since then she has not had any seizure or seizure-like activity.  She is tolerating the medications very well denies any side effect.  On occasion, her mother has reported word finding difficulties like patient knows what words to say but it is difficult for the words to come out.  Again no other complaints.  February 23 2023 Patient presents today for follow-up, she is accompanied by her mother.  Last visit was in August.  At that time we have decreased her topiramate  to 50 mg twice daily.  Since then she has not had a seizure or seizure-like activity.  She continues to swipe with VNS daily.  Currently she is doing well, no complaints, and no concerns. She continues to go to her daily program.    REVIEW OF SYSTEMS: Out of a complete 14 system review of symptoms, the patient complains only of the following symptoms, and all other reviewed systems are negative.  See HPI  ALLERGIES: Allergies  Allergen Reactions   Sulfa Antibiotics Hives    HOME MEDICATIONS: Outpatient Medications Prior to Visit  Medication Sig Dispense Refill   acetaminophen  (TYLENOL ) 500 MG tablet Take 1,000 mg by mouth every 6 (six) hours as needed for moderate pain or headache.     cloBAZam  (ONFI ) 10 MG tablet TAKE ONE TABLET (10MG  TOTAL) BY MOUTH ATBEDTIME 30 tablet 5   ferrous sulfate (SLOW IRON) 160 (50 Fe) MG TBCR SR tablet Take 1 tablet by mouth daily.     levETIRAcetam  (KEPPRA ) 1000 MG tablet TAKE TWO TABLETS (2000MG  TOTAL) BY MOUTHTWO TIMES DAILY 120 tablet 11   Multiple Vitamin (MULTIVITAMIN) tablet Take 1 tablet by mouth daily.     ursodiol (ACTIGALL) 300 MG capsule Take 300 mg by mouth 2 (two) times daily.      topiramate  (TOPAMAX ) 100 MG tablet Take 0.5 tablets (50 mg total) by mouth 2 (two) times daily. 60 tablet 11   No facility-administered medications prior to visit.    PAST MEDICAL HISTORY: Past Medical History:  Diagnosis Date   Developmental delay    mental age of 26, per mother   Elevated LFTs    Gait abnormality 04/07/2018   History of stroke    age 67   Seizures (HCC)    last seizure 09/2021    PAST SURGICAL HISTORY: Past Surgical History:  Procedure Laterality Date   BRAIN SURGERY     finger biopsy Right    MINOR EXCISION OF ORAL LESION N/A 05/01/2014   Procedure: EXCISION LIP MASS;  Surgeon: Reynold Caves, MD;  Location:  [facility];  Service: ENT;  Laterality: N/A;   VAGUS NERVE STIMULATOR INSERTION Left 12/10/2021   Procedure: VAGAL NERVE STIMULATOR IMPLANT;  Surgeon: Augusto Blonder, MD;  Location: [facility] OR;  Service: Neurosurgery;  Laterality: Left;  3C    FAMILY HISTORY: Family History  Problem Relation Age of Onset   Colon polyps Mother    Asthma Maternal Grandmother    Diabetes Maternal Grandmother    Heart disease Maternal Grandfather    Lung cancer Paternal Grandmother    Colon cancer Maternal Aunt     SOCIAL HISTORY: Social History   Socioeconomic History   Marital status: Single    Spouse name: Not on file   Number of children: 0   Years of education: 12   Highest education level: Not on file  Occupational History   Not on file  Tobacco Use   Smoking status: Never   Smokeless tobacco: Never  Vaping Use   Vaping status: Never Used  Substance and Sexual Activity   Alcohol use: No   Drug use: No   Sexual activity: Never    Birth control/protection: None  Other Topics Concern   Not on file  Social History Narrative   Patient lives with mother Loreda Rodriguez.   Patient has no children.    Patient as a high school education.   Patient is working.    Patient is single   Patient is left handed.   Patient does not drink caffeine.  Social  Drivers of Corporate investment banker Strain: Low Risk  (05/17/2020)   Overall Financial Resource Strain (CARDIA)    Difficulty of Paying Living Expenses: Not hard at all  Food Insecurity: Low Risk  (02/10/2022)   [facility]   Hunger Vital Sign    Worried About Running Out of Food in the Last Year: Never true    Ran Out of Food in the Last Year: Never true  Transportation Needs: No Transportation Needs (02/10/2022)   [facility]   Transportation    In the past 12 months, has lack of reliable transportation kept you from medical appointments, meetings, work or from getting things needed for daily living? : No  Physical Activity: Insufficiently Active (05/17/2020)   Exercise Vital Sign    Days of Exercise per Week: 2 days    Minutes of Exercise per Session: 20 min  Stress: No Stress Concern Present (05/17/2020)   [employer] of Occupational Health - Occupational Stress Questionnaire    Feeling of Stress : Not at all  Social Connections: Moderately Integrated (05/17/2020)   Social Connection and Isolation Panel [NHANES]    Frequency of Communication with Friends and Family: More than three times a week    Frequency of Social Gatherings with Friends and Family: Twice a week    Attends Religious Services: More than 4 times per year    Active Member of [employer] or Organizations: Yes    Attends Banker Meetings: More than 4 times per year    Marital Status: Never married  Intimate Partner Violence: Not At Risk (05/17/2020)   Humiliation, Afraid, Rape, and Kick questionnaire    Fear of Current or Ex-Partner: No    Emotionally Abused: No    Physically Abused: No    Sexually Abused: No    PHYSICAL EXAM  Vitals:   02/23/23 1424  BP: 118/74  Pulse: 63  Weight: 171 lb 8 oz (77.8 kg)  Height: 5\' 4"  (1.626 m)     Body mass index is 29.44 kg/m.  Generalized: Well developed, in no acute distress  Neurological  examination  Mentation: Alert oriented to time, place, history is mostly provided by her mother. Follows all commands speech and language fluent. Mild psychomotor retardation.  Cranial nerve II-XII: Extraocular movements were full, visual field were full on confrontational test. Facial sensation and strength were normal. Head turning and shoulder shrug  were normal and symmetric.No oral injury noted. Motor: Good strength all extremities, slight weakness to right upper extremity, flexion of the right fingers/arm Sensory: Sensory testing is intact to soft touch on all 4 extremities. No evidence of extinction is noted.  Coordination: Cerebellar testing reveals good finger-nose-finger Gait and station: Gait is slightly wide-based, slight limp on the right Reflexes: Deep tendon reflexes are symmetric but slightly increased on the right   DIAGNOSTIC DATA (LABS, IMAGING, TESTING) - I reviewed patient records, labs, notes, testing and imaging myself where available.  Lab Results  Component Value Date   WBC CANCELED 02/17/2023   HGB 11.9 10/15/2022   HCT 37.0 10/15/2022   MCV 86 10/15/2022   PLT 360 10/15/2022      Component Value Date/Time   NA 142 02/17/2023 0812   K 4.4 02/17/2023 0812   CL 109 (H) 02/17/2023 0812   CO2 CANCELED 02/17/2023 0812   GLUCOSE 88 02/17/2023 0812   GLUCOSE 92 12/04/2021 1028   BUN 16 02/17/2023 0812   CREATININE  0.92 02/17/2023 0812   CALCIUM 9.6 02/17/2023 0812   PROT 7.2 02/17/2023 0812   ALBUMIN 4.4 02/17/2023 0812   AST 21 02/17/2023 0812   ALT 20 02/17/2023 0812   ALKPHOS 155 (H) 02/17/2023 0812   BILITOT <0.2 02/17/2023 0812   GFRNONAA >60 12/04/2021 1028   GFRAA 116 10/11/2019 1516   Lab Results  Component Value Date   CHOL 203 (H) 02/17/2023   HDL 67 02/17/2023   LDLCALC 117 (H) 02/17/2023   TRIG 109 02/17/2023   CHOLHDL 3.0 02/17/2023   Lab Results  Component Value Date   HGBA1C 5.5 02/17/2023   No results found for: "VITAMINB12" Lab  Results  Component Value Date   TSH 1.000 02/17/2023      Cassandra Cleveland, MD 02/23/2023, 2:49 PM [facility] [address] [place], [place] [address] [phone]

## 2023-05-08 ENCOUNTER — Telehealth: Payer: Self-pay | Admitting: Neurology

## 2023-05-08 NOTE — Telephone Encounter (Signed)
 Pt's mother has called to report that for the last couple of days pt has complained on pain in top of neck where VNS is, complains of throat hurting where tube runs, please call pt's mother.

## 2023-05-11 NOTE — Telephone Encounter (Signed)
 Call to mother who reports that since about Wednesday last week patient has been complaining at times of soreness about where the wiring and generator are. Mother denies any swelling, redness, draining, infection, fever. She does report that patient has complained of some ear aches too but says adding a cotton swab helps. She denies any trauma to the generator. She states that patient does lift small water bottles for exercise and says she could have pulled or twisted the wrong way. The pain only comes hen turn turning her head. Patient has not had any seizures. Advised I would send to Dr. Samara Crest to review.  Called meghan M with VNS. Reviewed above info and advise will follow up once hearing back form Dr. Samara Crest

## 2023-05-11 NOTE — Telephone Encounter (Signed)
 Will continue to monitor, please call us  if it is getting worse.

## 2023-05-11 NOTE — Telephone Encounter (Signed)
 Daughter complains of where the device has been implanted hurts. Want to know if this is normal.

## 2023-05-19 ENCOUNTER — Other Ambulatory Visit (HOSPITAL_COMMUNITY)
Admission: RE | Admit: 2023-05-19 | Discharge: 2023-05-19 | Disposition: A | Discharge status: Home / Self Care | Source: Ambulatory Visit | Attending: Adult Health | Admitting: Adult Health

## 2023-05-19 ENCOUNTER — Ambulatory Visit: Admitting: Adult Health

## 2023-05-19 ENCOUNTER — Encounter: Payer: Self-pay | Admitting: Adult Health

## 2023-05-19 VITALS — BP 100/70 | HR 64 | Ht 64.0 in | Wt 167.0 lb

## 2023-05-19 DIAGNOSIS — Z01419 Encounter for gynecological examination (general) (routine) without abnormal findings: Secondary | ICD-10-CM | POA: Diagnosis not present

## 2023-05-19 DIAGNOSIS — Z1331 Encounter for screening for depression: Secondary | ICD-10-CM | POA: Diagnosis not present

## 2023-05-19 DIAGNOSIS — Z1151 Encounter for screening for human papillomavirus (HPV): Secondary | ICD-10-CM | POA: Diagnosis not present

## 2023-05-19 DIAGNOSIS — Z0000 Encounter for general adult medical examination without abnormal findings: Secondary | ICD-10-CM | POA: Diagnosis not present

## 2023-05-19 NOTE — Progress Notes (Signed)
 Patient ID: Vanessa Zuniga, female   DOB: [birth year], 38 y.o.   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 She had VNS placed in 2023. And is doing well. Her mom is with her.  PCP is Gloria Zarwolo NP Neurologist is Dr Samara Crest at [facility].  Current Medications, Allergies, Past Medical History, Past Surgical History, Family History and Social History were reviewed in [employer] record.     Review of Systems: Patient denies any headaches, hearing loss, fatigue, blurred vision, shortness of breath, chest pain, abdominal pain, problems with bowel movements, urination, or intercourse(has never had sex). No joint pain or mood swings.  Periods are irregular, skipped February, heavy March, light in April.    Physical Exam:BP 100/70 (BP Location: Left Arm, Patient Position: Sitting, Cuff Size: Normal)   Pulse 64   Ht 5\' 4"  (1.626 m)   Wt 167 lb (75.8 kg)   LMP 04/21/2023 (Approximate)   BMI 28.67 kg/m   General:  Well developed, well nourished, no acute distress Skin:  Warm and dry Neck:  Midline trachea, normal thyroid, good ROM, no lymphadenopathy Lungs; Clear to auscultation bilaterally Breast:  No dominant palpable mass, retraction, or nipple discharge Cardiovascular: Regular rate and rhythm Abdomen:  Soft, non tender, no hepatosplenomegaly Pelvic:  External genitalia is normal in appearance, no lesions.  The vagina is normal in appearance, used slim Pederson speculum. Urethra has no lesions or masses. The cervix is nulliparous, pap with HR HPV genotyping performed.  Uterus is felt to be normal size, shape, and contour.  No adnexal masses or tenderness noted.Bladder is non tender, no masses felt. Extremities/musculoskeletal:  No swelling or varicosities noted, no clubbing or cyanosis Psych:  No mood  changes, alert and cooperative,seems happy AA is 0 Fall risk is low    05/19/2023    8:35 AM 10/14/2022    1:06 PM 05/17/2020    3:09 PM  Depression screen PHQ 2/9  Decreased Interest 0 0 0  Down, Depressed, Hopeless 0 0 0  PHQ - 2 Score 0 0 0  Altered sleeping 0 0 0  Tired, decreased energy 0 0 0  Change in appetite 0 0 0  Feeling bad or failure about yourself  0 0 0  Trouble concentrating 0 0 0  Moving slowly or fidgety/restless 0 0 0  Suicidal thoughts 0 0 0  PHQ-9 Score 0 0 0  Difficult doing work/chores  Not difficult at all        05/19/2023    8:36 AM 10/14/2022    1:06 PM 05/17/2020    3:09 PM  GAD 7 : Generalized Anxiety Score  Nervous, Anxious, on Edge 0 0 0  Control/stop worrying 0 0 0  Worry too much - different things 0 0 0  Trouble relaxing 0 0 0  Restless 0 0 0  Easily annoyed or irritable 0 0 0  Afraid - awful might happen 0 0 0  Total GAD 7 Score 0 0 0  Anxiety Difficulty  Not difficult at all       Upstream - 05/19/23 0840       Pregnancy Intention Screening   Does the patient want to become pregnant in the next year? No    Does the patient's partner want to become pregnant in the next  year? No    Would the patient like to discuss contraceptive options today? No      Contraception Wrap Up   Current Method Abstinence    End Method Abstinence    Contraception Counseling Provided No            Examination chaperoned by Vanessa Aschoff LPN   Impression and plan: 1. Encounter for gynecological examination with Papanicolaou smear of cervix (Primary) Pap sent Pap in 3 years if normal Physical with PCP Labs with PCP and neurologist  - Cytology - PAP( [place])

## 2023-05-20 LAB — CYTOLOGY - PAP
Comment: NEGATIVE
Diagnosis: NEGATIVE
High risk HPV: NEGATIVE

## 2023-06-10 ENCOUNTER — Other Ambulatory Visit: Payer: Self-pay | Admitting: Neurology

## 2023-07-16 ENCOUNTER — Ambulatory Visit (INDEPENDENT_AMBULATORY_CARE_PROVIDER_SITE_OTHER): Payer: Medicare Other | Admitting: Family Medicine

## 2023-07-16 ENCOUNTER — Encounter: Payer: Self-pay | Admitting: Family Medicine

## 2023-07-16 VITALS — BP 123/74 | HR 71 | Resp 16 | Ht 64.0 in | Wt 169.0 lb

## 2023-07-16 DIAGNOSIS — J3089 Other allergic rhinitis: Secondary | ICD-10-CM

## 2023-07-16 DIAGNOSIS — T7840XA Allergy, unspecified, initial encounter: Secondary | ICD-10-CM | POA: Insufficient documentation

## 2023-07-16 NOTE — Assessment & Plan Note (Signed)
 Allergies: -Encouraged to take a non-drowsy antihistamine such as cetirizine (Zyrtec), loratadine (Claritin), or fexofenadine (Allegra) once daily as a first-line option. -For more severe symptoms, consider adding a nasal corticosteroid spray (e.g., Flonase or Nasacort) to help reduce inflammation and drainage. -Encouraged to avoid known allergens when possible, use saline nasal rinses, and stay well-hydrated to support symptom relief.SABRA

## 2023-07-16 NOTE — Progress Notes (Signed)
 Established Patient Office Visit  Subjective:  Patient ID: Vanessa Zuniga, female    DOB: January 15, 1985  Age: 38 y.o. MRN: [id number]  CC:  Chief Complaint  Patient presents with   Follow-up    Has some questions about what to take when allergies flare up and cause a lot of nasal drainage     HPI Vanessa Zuniga is a 38 y.o. female with past medical history of partial epilepsy with impairment of consciousness history of seizure and stroke, prediabetes presents for f/u of  chronic medical conditions. For the details of today's visit, please refer to the assessment and plan.     Past Medical History:  Diagnosis Date   Developmental delay    mental age of 32, per mother   Elevated LFTs    Gait abnormality 04/07/2018   History of stroke    age 48   Seizures (HCC)    last seizure 09/2021    Past Surgical History:  Procedure Laterality Date   BRAIN SURGERY     finger biopsy Right    MINOR EXCISION OF ORAL LESION N/A 05/01/2014   Procedure: EXCISION LIP MASS;  Surgeon: Daniel Moccasin, MD;  Location: [facility];  Service: ENT;  Laterality: N/A;   VAGUS NERVE STIMULATOR INSERTION Left 12/10/2021   Procedure: VAGAL NERVE STIMULATOR IMPLANT;  Surgeon: Lanis Pupa, MD;  Location: [facility];  Service: Neurosurgery;  Laterality: Left;  3C    Family History  Problem Relation Age of Onset   Colon polyps Mother    Asthma Maternal Grandmother    Diabetes Maternal Grandmother    Heart disease Maternal Grandfather    Lung cancer Paternal Grandmother    Colon cancer Maternal Aunt     Social History   Socioeconomic History   Marital status: Single    Spouse name: Not on file   Number of children: 0   Years of education: 12   Highest education level: Not on file  Occupational History   Not on file  Tobacco Use   Smoking status: Never   Smokeless tobacco: Never  Vaping Use   Vaping status: Never Used  Substance and Sexual Activity   Alcohol use: No   Drug use: No    Sexual activity: Never    Birth control/protection: None  Other Topics Concern   Not on file  Social History Narrative   Patient lives with mother Myrick.   Patient has no children.    Patient as a high school education.   Patient is working.    Patient is single   Patient is left handed.   Patient does not drink caffeine.   Social Drivers of Corporate investment banker Strain: Low Risk  (05/19/2023)   Overall Financial Resource Strain (CARDIA)    Difficulty of Paying Living Expenses: Not hard at all  Food Insecurity: No Food Insecurity (05/19/2023)   Hunger Vital Sign    Worried About Running Out of Food in the Last Year: Never true    Ran Out of Food in the Last Year: Never true  Transportation Needs: No Transportation Needs (05/19/2023)   PRAPARE - Administrator, Civil Service (Medical): No    Lack of Transportation (Non-Medical): No  Physical Activity: Sufficiently Active (05/19/2023)   Exercise Vital Sign    Days of Exercise per Week: 5 days    Minutes of Exercise per Session: 30 min  Stress: No Stress Concern Present (05/19/2023)   [employer]  of Occupational Health - Occupational Stress Questionnaire    Feeling of Stress : Not at all  Social Connections: Moderately Integrated (05/19/2023)   Social Connection and Isolation Panel    Frequency of Communication with Friends and Family: Once a week    Frequency of Social Gatherings with Friends and Family: More than three times a week    Attends Religious Services: More than 4 times per year    Active Member of [employer] or Organizations: Yes    Attends Banker Meetings: More than 4 times per year    Marital Status: Never married  Intimate Partner Violence: Not At Risk (05/19/2023)   Humiliation, Afraid, Rape, and Kick questionnaire    Fear of Current or Ex-Partner: No    Emotionally Abused: No    Physically Abused: No    Sexually Abused: No    Outpatient Medications Prior to Visit  Medication Sig  Dispense Refill   acetaminophen  (TYLENOL ) 500 MG tablet Take 1,000 mg by mouth every 6 (six) hours as needed for moderate pain or headache.     cloBAZam  (ONFI ) 10 MG tablet TAKE ONE TABLET (10MG  TOTAL) BY MOUTH ATBEDTIME 30 tablet 5   ferrous sulfate (SLOW IRON) 160 (50 Fe) MG TBCR SR tablet Take 1 tablet by mouth daily.     levETIRAcetam  (KEPPRA ) 1000 MG tablet TAKE TWO TABLETS (2000MG  TOTAL) BY MOUTHTWO TIMES DAILY 120 tablet 11   Multiple Vitamin (MULTIVITAMIN) tablet Take 1 tablet by mouth daily.     ursodiol (ACTIGALL) 300 MG capsule Take 300 mg by mouth 2 (two) times daily.     topiramate  (TOPAMAX ) 100 MG tablet Take 50 mg by mouth at bedtime.     No facility-administered medications prior to visit.    Allergies  Allergen Reactions   Sulfa Antibiotics Hives    ROS Review of Systems  Constitutional:  Negative for chills and fever.  Eyes:  Negative for visual disturbance.  Respiratory:  Negative for chest tightness and shortness of breath.   Neurological:  Negative for dizziness and headaches.      Objective:    Physical Exam HENT:     Head: Normocephalic.     Mouth/Throat:     Mouth: Mucous membranes are moist.  Cardiovascular:     Rate and Rhythm: Normal rate.     Heart sounds: Normal heart sounds.  Pulmonary:     Effort: Pulmonary effort is normal.     Breath sounds: Normal breath sounds.  Neurological:     Mental Status: She is alert.     BP 123/74   Pulse 71   Resp 16   Ht 5' 4 (1.626 m)   Wt 169 lb (76.7 kg)   SpO2 98%   BMI 29.01 kg/m  Wt Readings from Last 3 Encounters:  07/16/23 169 lb (76.7 kg)  05/19/23 167 lb (75.8 kg)  02/23/23 171 lb 8 oz (77.8 kg)    Lab Results  Component Value Date   TSH 1.000 02/17/2023   Lab Results  Component Value Date   WBC CANCELED 02/17/2023   HGB 11.9 10/15/2022   HCT 37.0 10/15/2022   MCV 86 10/15/2022   PLT 360 10/15/2022   Lab Results  Component Value Date   NA 142 02/17/2023   K 4.4 02/17/2023    CO2 CANCELED 02/17/2023   GLUCOSE 88 02/17/2023   BUN 16 02/17/2023   CREATININE 0.92 02/17/2023   BILITOT <0.2 02/17/2023   ALKPHOS 155 (H) 02/17/2023   AST  21 02/17/2023   ALT 20 02/17/2023   PROT 7.2 02/17/2023   ALBUMIN 4.4 02/17/2023   CALCIUM 9.6 02/17/2023   ANIONGAP 11 12/04/2021   EGFR 82 02/17/2023   GFR 80.20 11/14/2020   Lab Results  Component Value Date   CHOL 203 (H) 02/17/2023   Lab Results  Component Value Date   HDL 67 02/17/2023   Lab Results  Component Value Date   LDLCALC 117 (H) 02/17/2023   Lab Results  Component Value Date   TRIG 109 02/17/2023   Lab Results  Component Value Date   CHOLHDL 3.0 02/17/2023   Lab Results  Component Value Date   HGBA1C 5.5 02/17/2023      Assessment & Plan:  Allergy, initial encounter Assessment & Plan: Allergies: -Encouraged to take a non-drowsy antihistamine such as cetirizine (Zyrtec), loratadine (Claritin), or fexofenadine (Allegra) once daily as a first-line option. -For more severe symptoms, consider adding a nasal corticosteroid spray (e.g., Flonase or Nasacort) to help reduce inflammation and drainage. -Encouraged to avoid known allergens when possible, use saline nasal rinses, and stay well-hydrated to support symptom relief..    Note: This chart has been completed using Engineer, civil (consulting) software, and while attempts have been made to ensure accuracy, certain words and phrases may not be transcribed as intended.    Follow-up: Return in about 6 months (around 01/16/2024).   Haniyyah Sakuma, FNP

## 2023-07-16 NOTE — Patient Instructions (Addendum)
 I appreciate the opportunity to provide care to you today!    Follow up:  6 months  Labs: next visit                                   HAPPY Birthdayyyyyyyyyyyyyy!   Allergies: -You may take a non-drowsy antihistamine such as cetirizine (Zyrtec), loratadine (Claritin), or fexofenadine (Allegra) once daily as a first-line option. -For more severe symptoms, consider adding a nasal corticosteroid spray (e.g., Flonase or Nasacort) to help reduce inflammation and drainage. -Avoid known allergens when possible, use saline nasal rinses, and stay well-hydrated to support symptom relief..    Please follow up if your symptoms worsen or fail to improve.    Please continue to a heart-healthy diet and increase your physical activities. Try to exercise for at least five days a week.    It was a pleasure to see you and I look forward to continuing to work together on your health and well-being. Please do not hesitate to call the office if you need care or have questions about your care.  In case of emergency, please visit the Emergency Department for urgent care, or contact our clinic at [phone] to schedule an appointment. We're here to help you!   Have a wonderful day and week. With Gratitude, Jamea Robicheaux MSN, FNP-BC

## 2023-07-21 ENCOUNTER — Other Ambulatory Visit: Payer: Self-pay | Admitting: Nurse Practitioner

## 2023-07-21 DIAGNOSIS — K739 Chronic hepatitis, unspecified: Secondary | ICD-10-CM

## 2023-07-21 DIAGNOSIS — D376 Neoplasm of uncertain behavior of liver, gallbladder and bile ducts: Secondary | ICD-10-CM

## 2023-07-21 DIAGNOSIS — R748 Abnormal levels of other serum enzymes: Secondary | ICD-10-CM

## 2023-08-03 ENCOUNTER — Ambulatory Visit
Admission: RE | Admit: 2023-08-03 | Discharge: 2023-08-03 | Disposition: A | Discharge status: Home / Self Care | Source: Ambulatory Visit | Attending: Nurse Practitioner | Admitting: Nurse Practitioner

## 2023-08-03 DIAGNOSIS — D376 Neoplasm of uncertain behavior of liver, gallbladder and bile ducts: Secondary | ICD-10-CM

## 2023-08-03 DIAGNOSIS — R748 Abnormal levels of other serum enzymes: Secondary | ICD-10-CM

## 2023-08-03 DIAGNOSIS — K739 Chronic hepatitis, unspecified: Secondary | ICD-10-CM

## 2023-08-10 ENCOUNTER — Ambulatory Visit
Admission: RE | Admit: 2023-08-10 | Discharge: 2023-08-10 | Disposition: A | Discharge status: Home / Self Care | Source: Ambulatory Visit | Attending: Nurse Practitioner

## 2023-08-10 MED ORDER — IOPAMIDOL (ISOVUE-300) INJECTION 61%: 100.0000 mL | Freq: Once | INTRAVENOUS | Status: DC | PRN

## 2023-08-10 NOTE — Progress Notes (Signed)
 Pt has been scheduled multiple times for outpatient CT scans w/ contrast and had to be rescheduled at the hospital. Pt has extremely small veins and needs an ultrasound guided IV in the hospital setting.

## 2023-08-11 ENCOUNTER — Other Ambulatory Visit: Payer: Self-pay | Admitting: Nurse Practitioner

## 2023-08-11 DIAGNOSIS — K739 Chronic hepatitis, unspecified: Secondary | ICD-10-CM

## 2023-08-11 DIAGNOSIS — R748 Abnormal levels of other serum enzymes: Secondary | ICD-10-CM

## 2023-08-11 DIAGNOSIS — D376 Neoplasm of uncertain behavior of liver, gallbladder and bile ducts: Secondary | ICD-10-CM

## 2023-08-12 ENCOUNTER — Other Ambulatory Visit: Payer: Self-pay | Admitting: Neurology

## 2023-08-12 NOTE — Telephone Encounter (Signed)
 Last seen 02/23/23, next appt 02/23/24 Dispenses   Dispensed Days Supply Quantity Provider Pharmacy  CLOBAZAM  10 MG TABLET 07/13/2023 30 30 tablet Camara, Amadou, MD [facility] - ...  CLOBAZAM  10 MG TAB 06/15/2023 30 30 each Camara, Amadou, MD [facility] - ...  CLOBAZAM      TAB 10MG  05/19/2023 30 30 each Gregg Lek, MD [facility] - ...  CLOBAZAM      TAB 10MG  04/20/2023 30 30 each Gregg Lek, MD [facility] - ...  CLOBAZAM      TAB 10MG  03/17/2023 30 30 each Gregg Lek, MD [facility] - ...  CLOBAZAM      TAB 10MG  02/16/2023 30 30 each Gregg Lek, MD [facility] - ...  CLOBAZAM      TAB 10MG  01/16/2023 30 30 each Gregg Lek, MD [facility] - ...  CLOBAZAM      TAB 10MG  12/17/2022 30 30 each Gregg Lek, MD [facility] - ...  CLOBAZAM      TAB 10MG  11/17/2022 30 30 each Gregg Lek, MD [facility] - ...  CLOBAZAM      TAB 10MG  10/20/2022 30 30 each Gregg Lek, MD [facility] - ...  CLOBAZAM      TAB 10MG  09/22/2022 30 30 each Gregg Lek, MD [facility] - ...  CLOBAZAM      TAB 10MG  08/25/2022 30 30 each Gregg Lek, MD  [facility] - [facility]

## 2023-08-28 ENCOUNTER — Ambulatory Visit (HOSPITAL_COMMUNITY)

## 2023-09-09 ENCOUNTER — Ambulatory Visit (HOSPITAL_COMMUNITY)
Admission: RE | Admit: 2023-09-09 | Discharge: 2023-09-09 | Disposition: A | Discharge status: Home / Self Care | Source: Ambulatory Visit | Attending: Nurse Practitioner | Admitting: Nurse Practitioner

## 2023-09-09 DIAGNOSIS — K739 Chronic hepatitis, unspecified: Secondary | ICD-10-CM | POA: Diagnosis present

## 2023-09-09 DIAGNOSIS — D376 Neoplasm of uncertain behavior of liver, gallbladder and bile ducts: Secondary | ICD-10-CM | POA: Insufficient documentation

## 2023-09-09 DIAGNOSIS — R748 Abnormal levels of other serum enzymes: Secondary | ICD-10-CM | POA: Diagnosis present

## 2023-09-09 MED ORDER — IOHEXOL 300 MG/ML  SOLN
100.0000 mL | Freq: Once | INTRAMUSCULAR | Status: AC | PRN
  Administered 2023-09-09: 100 mL via INTRAVENOUS

## 2023-09-17 ENCOUNTER — Other Ambulatory Visit: Payer: Self-pay

## 2023-09-17 ENCOUNTER — Emergency Department (HOSPITAL_COMMUNITY)
Admission: EM | Admit: 2023-09-17 | Discharge: 2023-09-17 | Disposition: A | Discharge status: Home / Self Care | Attending: Emergency Medicine | Admitting: Emergency Medicine

## 2023-09-17 DIAGNOSIS — F419 Anxiety disorder, unspecified: Secondary | ICD-10-CM | POA: Diagnosis not present

## 2023-09-17 DIAGNOSIS — R569 Unspecified convulsions: Secondary | ICD-10-CM | POA: Diagnosis present

## 2023-09-17 DIAGNOSIS — N390 Urinary tract infection, site not specified: Secondary | ICD-10-CM | POA: Diagnosis not present

## 2023-09-17 LAB — URINALYSIS, ROUTINE W REFLEX MICROSCOPIC
Bacteria, UA: NONE SEEN
Bilirubin Urine: NEGATIVE
Glucose, UA: NEGATIVE mg/dL
Ketones, ur: NEGATIVE mg/dL
Nitrite: NEGATIVE
Protein, ur: 30 mg/dL — AB
Specific Gravity, Urine: 1.018 (ref 1.005–1.030)
pH: 5 (ref 5.0–8.0)

## 2023-09-17 LAB — COMPREHENSIVE METABOLIC PANEL WITH GFR
ALT: 19 U/L (ref 0–44)
AST: 30 U/L (ref 15–41)
Albumin: 3.8 g/dL (ref 3.5–5.0)
Alkaline Phosphatase: 117 U/L (ref 38–126)
Anion gap: 14 (ref 5–15)
BUN: 11 mg/dL (ref 6–20)
CO2: 21 mmol/L — ABNORMAL LOW (ref 22–32)
Calcium: 9.3 mg/dL (ref 8.9–10.3)
Chloride: 101 mmol/L (ref 98–111)
Creatinine, Ser: 0.9 mg/dL (ref 0.44–1.00)
GFR, Estimated: >60 mL/min (ref >60)
Glucose, Bld: 90 mg/dL (ref 70–99)
Potassium: 3.9 mmol/L (ref 3.5–5.1)
Sodium: 136 mmol/L (ref 135–145)
Total Bilirubin: 0.4 mg/dL (ref 0.0–1.2)
Total Protein: 7.7 g/dL (ref 6.5–8.1)

## 2023-09-17 LAB — CBC WITH DIFFERENTIAL/PLATELET
Abs Immature Granulocytes: 0.02 K/uL (ref 0.00–0.07)
Basophils Absolute: 0 K/uL (ref 0.0–0.1)
Basophils Relative: 1 %
Eosinophils Absolute: 0.1 K/uL (ref 0.0–0.5)
Eosinophils Relative: 1 %
HCT: 37.5 % (ref 36.0–46.0)
Hemoglobin: 12.1 g/dL (ref 12.0–15.0)
Immature Granulocytes: 0 %
Lymphocytes Relative: 22 %
Lymphs Abs: 1.7 K/uL (ref 0.7–4.0)
MCH: 28 pg (ref 26.0–34.0)
MCHC: 32.3 g/dL (ref 30.0–36.0)
MCV: 86.8 fL (ref 80.0–100.0)
Monocytes Absolute: 0.5 K/uL (ref 0.1–1.0)
Monocytes Relative: 7 %
Neutro Abs: 5.5 K/uL (ref 1.7–7.7)
Neutrophils Relative %: 69 %
Platelets: 168 K/uL (ref 150–400)
RBC: 4.32 MIL/uL (ref 3.87–5.11)
RDW: 12.3 % (ref 11.5–15.5)
WBC: 7.9 K/uL (ref 4.0–10.5)
nRBC: 0 % (ref 0.0–0.2)

## 2023-09-17 LAB — MAGNESIUM: Magnesium: 2 mg/dL (ref 1.7–2.4)

## 2023-09-17 LAB — ETHANOL: Alcohol, Ethyl (B): <15 mg/dL (ref <15)

## 2023-09-17 LAB — PREGNANCY, URINE: Preg Test, Ur: NEGATIVE

## 2023-09-17 MED ORDER — LEVETIRACETAM (KEPPRA) 500 MG/5 ML ADULT IV PUSH
1000.0000 mg | Freq: Once | INTRAVENOUS | Status: AC
  Administered 2023-09-17: 1000 mg via INTRAVENOUS
  Filled 2023-09-17: qty 10

## 2023-09-17 MED ORDER — LORAZEPAM 2 MG/ML IJ SOLN: 4.0000 mg | INTRAMUSCULAR | Status: DC | PRN

## 2023-09-17 MED ORDER — CEPHALEXIN 500 MG PO CAPS: 500.0000 mg | ORAL_CAPSULE | Freq: Two times a day (BID) | ORAL | 0 refills | Status: AC

## 2023-09-17 MED ORDER — SODIUM CHLORIDE 0.9 % IV SOLN
1.0000 g | Freq: Once | INTRAVENOUS | Status: AC
  Administered 2023-09-17: 1 g via INTRAVENOUS
  Filled 2023-09-17: qty 10

## 2023-09-17 MED ORDER — SODIUM CHLORIDE 0.9 % IV SOLN: INTRAVENOUS | Status: DC

## 2023-09-17 MED ORDER — SODIUM CHLORIDE 0.9 % IV BOLUS
1000.0000 mL | Freq: Once | INTRAVENOUS | Status: AC
  Administered 2023-09-17: 1000 mL via INTRAVENOUS

## 2023-09-17 NOTE — Discharge Instructions (Signed)
 As discussed, your evaluation today has been largely reassuring.  But, it is important that you monitor your condition carefully, and do not hesitate to return to the ED if you develop new, or concerning changes in your condition. ? ?Otherwise, please follow-up with your physician for appropriate ongoing care. ? ?

## 2023-09-17 NOTE — ED Notes (Signed)
 Seizure pads applied to bed rails

## 2023-09-17 NOTE — ED Provider Notes (Signed)
 [place] EMERGENCY DEPARTMENT AT [facility] Provider Note   CSN: [id number] Arrival date & time: 09/17/23  9073     Patient presents with: Seizures   Vanessa Zuniga is a 38 y.o. female.   HPI Patient presents via EMS after witnessed seizure. Patient has a history of seizures, takes Keppra  and some other medication. She has not had a seizure in 2 years reportedly.  Today she had approximately 2 minutes of tonic, clonic seizure-like activity, without fall, trauma, but with postictal phase.  Patient reportedly recovered and route, and was talkative with EMS providers prior to ED arrival. Patient notes anxiousness about being in the hospital, otherwise denies pain.    Prior to Admission medications   Medication Sig Start Date End Date Taking? Authorizing Provider  acetaminophen  (TYLENOL ) 500 MG tablet Take 1,000 mg by mouth every 6 (six) hours as needed for moderate pain or headache.   Yes [provider]  cephALEXin  (KEFLEX ) 500 MG capsule Take 1 capsule (500 mg total) by mouth 2 (two) times daily for 5 days. 09/17/23 09/22/23 Yes Garrick Charleston, MD  cloBAZam  (ONFI ) 10 MG tablet TAKE ONE TABLET (10MG  TOTAL) BY MOUTH ATBEDTIME 08/12/23  Yes Camara, Amadou, MD  levETIRAcetam  (KEPPRA ) 1000 MG tablet TAKE TWO TABLETS (2000MG  TOTAL) BY MOUTHTWO TIMES DAILY 06/11/23  Yes Camara, Amadou, MD  Multiple Vitamin (MULTIVITAMIN) tablet Take 1 tablet by mouth daily.   Yes [provider]  ursodiol (ACTIGALL) 300 MG capsule Take 300 mg by mouth 2 (two) times daily.   Yes [provider]  ferrous sulfate (SLOW IRON) 160 (50 Fe) MG TBCR SR tablet Take 1 tablet by mouth daily. Patient not taking: Reported on 09/17/2023    [provider]    Allergies: Sulfa antibiotics    Review of Systems  Updated Vital Signs BP 106/70   Pulse 93   Temp 97.8 F (36.6 C) (Oral)   Resp 14   LMP 09/09/2023 (Exact Date)   SpO2 100%   Physical Exam Vitals and nursing  note reviewed.  Constitutional:      General: She is not in acute distress.    Appearance: She is well-developed.  HENT:     Head: Normocephalic and atraumatic.  Eyes:     Conjunctiva/sclera: Conjunctivae normal.  Cardiovascular:     Rate and Rhythm: Normal rate and regular rhythm.  Pulmonary:     Effort: Pulmonary effort is normal. No respiratory distress.     Breath sounds: Normal breath sounds. No stridor.  Abdominal:     General: There is no distension.  Skin:    General: Skin is warm and dry.  Neurological:     Mental Status: She is alert.     Cranial Nerves: No cranial nerve deficit.  Psychiatric:        Mood and Affect: Mood normal.     (all labs ordered are listed, but only abnormal results are displayed) Labs Reviewed  COMPREHENSIVE METABOLIC PANEL WITH GFR - Abnormal; Notable for the following components:      Result Value   CO2 21 (*)    All other components within normal limits  URINALYSIS, ROUTINE W REFLEX MICROSCOPIC - Abnormal; Notable for the following components:   APPearance HAZY (*)    Hgb urine dipstick SMALL (*)    Protein, ur 30 (*)    Leukocytes,Ua MODERATE (*)    All other components within normal limits  CBC WITH DIFFERENTIAL/PLATELET  MAGNESIUM  ETHANOL  PREGNANCY, URINE  CBG MONITORING, ED    EKG: None  Radiology: No results found.   Procedures   Medications Ordered in the ED  sodium chloride  0.9 % bolus 1,000 mL (0 mLs Intravenous Stopped 09/17/23 1256)    And  0.9 %  sodium chloride  infusion ( Intravenous New Bag/Given 09/17/23 1256)  LORazepam  (ATIVAN ) injection 4 mg (has no administration in time range)  levETIRAcetam  (KEPPRA ) undiluted injection 1,000 mg (1,000 mg Intravenous Given 09/17/23 1042)  cefTRIAXone  (ROCEPHIN ) 1 g in sodium chloride  0.9 % 100 mL IVPB (0 g Intravenous Stopped 09/17/23 1256)                                    Medical Decision Making Adult female with seizures, multiple meds, presents after witnessed  seizure.  Concern for breakthrough seizure versus occult infection, electrolyte abnormalities, dehydration or other precipitant. Seizure precaution started, Keppra  loading dose provided, case discussed with nursing, EMS on arrival.   Amount and/or Complexity of Data Reviewed Independent Historian: parent External Data Reviewed: notes. Labs: ordered. Decision-making details documented in ED Course.  Risk Prescription drug management. Decision regarding hospitalization. Diagnosis or treatment significantly limited by social determinants of health.  Patient coming by her mother.  We discussed patient's history, seizures, implanted vagus nerve stimulator.  Mother corroborates patient has not had a seizure in some time.  Update:, Patient with evidence for urinary tract infection. 12:56 PM Patient in no distress, is tolerating antibiotics, Keppra , fluids, monitoring for hours without additional seizure activity or decompensation. With otherwise reassuring findings, patient will follow-up with her neurologist. I discussed this with the patient's mother at length.      Final diagnoses:  Seizure (HCC)  Lower urinary tract infectious disease    ED Discharge Orders          Ordered    cephALEXin  (KEFLEX ) 500 MG capsule  2 times daily        09/17/23 1256               Garrick Charleston, MD 09/17/23 1256

## 2023-09-17 NOTE — ED Triage Notes (Signed)
 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 Pt is starting to come around and does know where she is at. Does c/o abd pain 5/10 but denies N/V/D. EDP at bedside

## 2023-09-20 ENCOUNTER — Encounter: Payer: Self-pay | Admitting: Neurology

## 2023-10-14 ENCOUNTER — Other Ambulatory Visit: Payer: Self-pay

## 2023-10-14 ENCOUNTER — Emergency Department (HOSPITAL_COMMUNITY)
Admission: EM | Admit: 2023-10-14 | Discharge: 2023-10-14 | Disposition: A | Discharge status: Home / Self Care | Attending: Emergency Medicine | Admitting: Emergency Medicine

## 2023-10-14 ENCOUNTER — Encounter (HOSPITAL_COMMUNITY): Payer: Self-pay

## 2023-10-14 DIAGNOSIS — Z8673 Personal history of transient ischemic attack (TIA), and cerebral infarction without residual deficits: Secondary | ICD-10-CM | POA: Insufficient documentation

## 2023-10-14 DIAGNOSIS — R569 Unspecified convulsions: Secondary | ICD-10-CM | POA: Insufficient documentation

## 2023-10-14 LAB — CBC WITH DIFFERENTIAL/PLATELET
Abs Immature Granulocytes: 0.04 K/uL (ref 0.00–0.07)
Basophils Absolute: 0.1 K/uL (ref 0.0–0.1)
Basophils Relative: 1 %
Eosinophils Absolute: 0.1 K/uL (ref 0.0–0.5)
Eosinophils Relative: 1 %
HCT: 37.1 % (ref 36.0–46.0)
Hemoglobin: 12 g/dL (ref 12.0–15.0)
Immature Granulocytes: 0 %
Lymphocytes Relative: 23 %
Lymphs Abs: 2 K/uL (ref 0.7–4.0)
MCH: 27.7 pg (ref 26.0–34.0)
MCHC: 32.3 g/dL (ref 30.0–36.0)
MCV: 85.7 fL (ref 80.0–100.0)
Monocytes Absolute: 0.6 K/uL (ref 0.1–1.0)
Monocytes Relative: 7 %
Neutro Abs: 6.2 K/uL (ref 1.7–7.7)
Neutrophils Relative %: 68 %
Platelets: 309 K/uL (ref 150–400)
RBC: 4.33 MIL/uL (ref 3.87–5.11)
RDW: 12.2 % (ref 11.5–15.5)
WBC: 9 K/uL (ref 4.0–10.5)
nRBC: 0 % (ref 0.0–0.2)

## 2023-10-14 LAB — URINALYSIS, ROUTINE W REFLEX MICROSCOPIC
Bacteria, UA: NONE SEEN
Bilirubin Urine: NEGATIVE
Glucose, UA: NEGATIVE mg/dL
Ketones, ur: NEGATIVE mg/dL
Nitrite: NEGATIVE
Protein, ur: NEGATIVE mg/dL
Specific Gravity, Urine: 1.011 (ref 1.005–1.030)
pH: 5 (ref 5.0–8.0)

## 2023-10-14 LAB — COMPREHENSIVE METABOLIC PANEL WITH GFR
ALT: 21 U/L (ref 0–44)
AST: 25 U/L (ref 15–41)
Albumin: 4.3 g/dL (ref 3.5–5.0)
Alkaline Phosphatase: 133 U/L — ABNORMAL HIGH (ref 38–126)
Anion gap: 12 (ref 5–15)
BUN: 16 mg/dL (ref 6–20)
CO2: 24 mmol/L (ref 22–32)
Calcium: 9.6 mg/dL (ref 8.9–10.3)
Chloride: 100 mmol/L (ref 98–111)
Creatinine, Ser: 0.84 mg/dL (ref 0.44–1.00)
GFR, Estimated: >60 mL/min (ref >60)
Glucose, Bld: 95 mg/dL (ref 70–99)
Potassium: 4.1 mmol/L (ref 3.5–5.1)
Sodium: 135 mmol/L (ref 135–145)
Total Bilirubin: <0.2 mg/dL (ref 0.0–1.2)
Total Protein: 7.5 g/dL (ref 6.5–8.1)

## 2023-10-14 LAB — CBG MONITORING, ED: Glucose-Capillary: 83 mg/dL (ref 70–99)

## 2023-10-14 LAB — MAGNESIUM: Magnesium: 2.2 mg/dL (ref 1.7–2.4)

## 2023-10-14 LAB — HCG, SERUM, QUALITATIVE: Preg, Serum: NEGATIVE

## 2023-10-14 NOTE — Discharge Instructions (Signed)
 Increase your Onfi  dose to 5 mg in the morning and 10 mg at night.

## 2023-10-14 NOTE — ED Provider Notes (Signed)
 [place] EMERGENCY DEPARTMENT AT [facility] Provider Note   CSN: [id number] Arrival date & time: 10/14/23  1436     Patient presents with: Seizures and Abdominal Pain   Vanessa Zuniga is a 38 y.o. female.   Pt is a 38 yo female with pmhx significant for seizures, hx CVA (age 21 with right sided weakness), developmental delay (mental age of 1).  Pt has been compliant with her seizure meds as mom gives them to her.  She was seen in the ED on 9/4 for a seizure.  She did have a UTI, so it was thought the seizure was provoked.  No med changes recommended by her neurologist (Dr. Gregg).  Pt was at her adult daycare today and had 2 seizures.  Pt takes onfi  10 mg daily, keppra  2000 mg bid, and has a vagal nerve stimulator that was placed in October of 2023.  Pt had not had a seizure since then until the 4th and had been weaned off topamax  (she was on 100 mg bid) starting in Feb of 2025.  Pt is back to her normal mental status now.        Prior to Admission medications   Medication Sig Start Date End Date Taking? Authorizing Provider  acetaminophen  (TYLENOL ) 500 MG tablet Take 1,000 mg by mouth every 6 (six) hours as needed for moderate pain or headache.    [provider]  cloBAZam  (ONFI ) 10 MG tablet TAKE ONE TABLET (10MG  TOTAL) BY MOUTH ATBEDTIME 08/12/23   Gregg Lek, MD  ferrous sulfate (SLOW IRON) 160 (50 Fe) MG TBCR SR tablet Take 1 tablet by mouth daily. Patient not taking: Reported on 09/17/2023    [provider]  levETIRAcetam  (KEPPRA ) 1000 MG tablet TAKE TWO TABLETS (2000MG  TOTAL) BY MOUTHTWO TIMES DAILY 06/11/23   Camara, Amadou, MD  Multiple Vitamin (MULTIVITAMIN) tablet Take 1 tablet by mouth daily.    [provider]  ursodiol (ACTIGALL) 300 MG capsule Take 300 mg by mouth 2 (two) times daily.    [provider]    Allergies: Sulfa antibiotics    Review of Systems  Neurological:  Positive for seizures.  All other systems  reviewed and are negative.   Updated Vital Signs BP 120/80   Pulse 78   Temp 98.1 F (36.7 C) (Oral)   Resp 17   SpO2 100%   Physical Exam Vitals and nursing note reviewed.  Constitutional:      Appearance: She is well-developed. She is obese.  HENT:     Head: Normocephalic and atraumatic.     Mouth/Throat:     Mouth: Mucous membranes are moist.     Pharynx: Oropharynx is clear.  Eyes:     Extraocular Movements: Extraocular movements intact.     Pupils: Pupils are equal, round, and reactive to light.  Cardiovascular:     Rate and Rhythm: Normal rate and regular rhythm.     Heart sounds: Normal heart sounds.  Pulmonary:     Effort: Pulmonary effort is normal.     Breath sounds: Normal breath sounds.  Abdominal:     General: Abdomen is flat. Bowel sounds are normal.     Palpations: Abdomen is soft.  Skin:    General: Skin is warm.     Capillary Refill: Capillary refill takes less than 2 seconds.  Neurological:     Mental Status: She is alert. Mental status is at baseline.  Psychiatric:        Mood  and Affect: Mood normal.     (all labs ordered are listed, but only abnormal results are displayed) Labs Reviewed  COMPREHENSIVE METABOLIC PANEL WITH GFR - Abnormal; Notable for the following components:      Result Value   Alkaline Phosphatase 133 (*)    All other components within normal limits  URINALYSIS, ROUTINE W REFLEX MICROSCOPIC - Abnormal; Notable for the following components:   Color, Urine STRAW (*)    Hgb urine dipstick MODERATE (*)    Leukocytes,Ua SMALL (*)    All other components within normal limits  CBC WITH DIFFERENTIAL/PLATELET  HCG, SERUM, QUALITATIVE  MAGNESIUM  LEVETIRACETAM  LEVEL  CBG MONITORING, ED    EKG: None  Radiology: No results found.   Procedures   Medications Ordered in the ED - No data to display                                  Medical Decision Making Amount and/or Complexity of Data Reviewed Labs:  ordered.   This patient presents to the ED for concern of seizure, this involves an extensive number of treatment options, and is a complaint that carries with it a high risk of complications and morbidity.  The differential diagnosis includes breakthrough seizure, med noncompliance   Co morbidities that complicate the patient evaluation  seizures, hx CVA (age 75 with right sided weakness), developmental delay (mental age of 27).   Additional history obtained:  Additional history obtained from epic chart review External records from outside source obtained and reviewed including EMS report/family   Lab Tests:  I Ordered, and personally interpreted labs.  The pertinent results include:  cbc nl, cmp nl, mg nl, preg neg, ua neg   Medicines ordered and prescription drug management:   I have reviewed the patients home medicines and have made adjustments as needed   Consultations Obtained:  I requested consultation with the neurologist (Dr. Voncile),  and discussed lab and imaging findings as well as pertinent plan - he recommends increasing Onfi  to 5 mg in the morning and 10 mg at night.  He also asks the pt contact Dr. Gregg tomorrow to see if he agrees with this plan or wants something different.   Problem List / ED Course:  Seizure:  Pt is 100% back to her baseline.  Seizure is likely breakthrough as pt's been taking meds and has no infection.  I will have pt increase Onfi  dose and f/u with Dr. Gregg.   Reevaluation:  After the interventions noted above, I reevaluated the patient and found that they have :improved   Social Determinants of Health:  Lives at home   Dispostion:  After consideration of the diagnostic results and the patients response to treatment, I feel that the patent would benefit from discharge with outpatient f/u.       Final diagnoses:  Seizure [facility])    ED Discharge Orders     None          Dean Clarity, MD 10/14/23 1850

## 2023-10-14 NOTE — ED Triage Notes (Signed)
 Patient BIB [facility] from an adult daycare. come in for complaint of Seizures. HX of seizures. Having abdominal pain. Has been taking her medications. CBG 96. EMS BP 130/97, Pulse 127, 100% on room air.

## 2023-10-15 ENCOUNTER — Other Ambulatory Visit: Payer: Self-pay | Admitting: Neurology

## 2023-10-15 LAB — LEVETIRACETAM LEVEL: Levetiracetam Lvl: 59.4 ug/mL — ABNORMAL HIGH (ref 10.0–40.0)

## 2023-10-15 MED ORDER — CLOBAZAM 10 MG PO TABS: ORAL_TABLET | ORAL | 5 refills | Status: AC

## 2023-10-21 ENCOUNTER — Telehealth: Payer: Self-pay

## 2023-10-21 NOTE — Telephone Encounter (Signed)
 [facility]ABRA

## 2023-11-24 ENCOUNTER — Ambulatory Visit (INDEPENDENT_AMBULATORY_CARE_PROVIDER_SITE_OTHER)

## 2023-11-24 VITALS — BP 102/70 | HR 75 | Resp 12 | Ht 64.0 in | Wt 173.1 lb

## 2023-11-24 DIAGNOSIS — Z0000 Encounter for general adult medical examination without abnormal findings: Secondary | ICD-10-CM

## 2023-11-24 NOTE — Patient Instructions (Signed)
 Vanessa Zuniga,  Thank you for taking the time for your Medicare Wellness Visit. I appreciate your continued commitment to your health goals. Please review the care plan we discussed, and feel free to reach out if I can assist you further.  Please note that Annual Wellness Visits do not include a physical exam. Some assessments may be limited, especially if the visit was conducted virtually. If needed, we may recommend an in-person follow-up with your provider.  Ongoing Care Seeing your primary care provider every 3 to 6 months helps us  monitor your health and provide consistent, personalized care.   Referrals If a referral was made during today's visit and you haven't received any updates within two weeks, please contact the referred provider directly to check on the status.  Recommended Screenings:  Health Maintenance  Topic Date Due   Medicare Annual Wellness Visit  Never done   Pneumococcal Vaccine (1 of 2 - PCV) Never done   HPV Vaccine (1 - 3-dose SCDM series) Never done   DTaP/Tdap/Td vaccine (2 - Td or Tdap) 10/15/2022   Pap with HPV screening  05/18/2028   Flu Shot  Completed   Hepatitis B Vaccine  Completed   COVID-19 Vaccine  Completed   Hepatitis C Screening  Completed   HIV Screening  Completed   Meningitis B Vaccine  Aged Out       10/14/2023    2:59 PM  Advanced Directives  Does Patient Have a Medical Advance Directive? No  Does patient want to make changes to medical advance directive? No - Patient declined  Copy of Healthcare Power of Attorney in Chart? No - copy requested  Would patient like information on creating a medical advance directive? No - Patient declined    Vision: Annual vision screenings are recommended for early detection of glaucoma, cataracts, and diabetic retinopathy. These exams can also reveal signs of chronic conditions such as diabetes and high blood pressure.  Dental: Annual dental screenings help detect early signs of oral cancer, gum  disease, and other conditions linked to overall health, including heart disease and diabetes.  Please see the attached documents for additional preventive care recommendations.

## 2023-11-24 NOTE — Progress Notes (Signed)
 Chief Complaint  Patient presents with   Medicare Wellness     Subjective:   Vanessa Zuniga is a 38 y.o. female who presents for a Medicare Annual Wellness Visit.  Allergies (verified) Sulfa antibiotics   History: Past Medical History:  Diagnosis Date   Developmental delay    mental age of 68, per mother   Elevated LFTs    Gait abnormality 04/07/2018   History of stroke    age 36   Seizures (HCC)    last seizure 09/2021   Past Surgical History:  Procedure Laterality Date   BRAIN SURGERY     finger biopsy Right    MINOR EXCISION OF ORAL LESION N/A 05/01/2014   Procedure: EXCISION LIP MASS;  Surgeon: Daniel Moccasin, MD;  Location: [facility];  Service: ENT;  Laterality: N/A;   VAGUS NERVE STIMULATOR INSERTION Left 12/10/2021   Procedure: VAGAL NERVE STIMULATOR IMPLANT;  Surgeon: Lanis Pupa, MD;  Location: [facility] OR;  Service: Neurosurgery;  Laterality: Left;  3C   Family History  Problem Relation Age of Onset   Colon polyps Mother    Asthma Maternal Grandmother    Diabetes Maternal Grandmother    Heart disease Maternal Grandfather    Lung cancer Paternal Grandmother    Colon cancer Maternal Aunt    Social History   Occupational History   Not on file  Tobacco Use   Smoking status: Never   Smokeless tobacco: Never  Vaping Use   Vaping status: Never Used  Substance and Sexual Activity   Alcohol use: No   Drug use: No   Sexual activity: Never    Birth control/protection: None   Tobacco Counseling Counseling given: Yes  SDOH Screenings   Food Insecurity: No Food Insecurity (11/24/2023)  Housing: Low Risk  (11/24/2023)  Transportation Needs: No Transportation Needs (11/24/2023)  Utilities: Not At Risk (11/24/2023)  Alcohol Screen: Low Risk  (05/19/2023)  Depression (PHQ2-9): Low Risk  (11/24/2023)  Financial Resource Strain: Low Risk  (05/19/2023)  Physical Activity: Sufficiently Active (11/24/2023)  Social Connections: Moderately Integrated  (11/24/2023)  Stress: No Stress Concern Present (11/24/2023)  Tobacco Use: Low Risk  (11/24/2023)  Health Literacy: Adequate Health Literacy (11/24/2023)   Depression Screen    11/24/2023    4:15 PM 07/16/2023    1:49 PM 05/19/2023    8:35 AM 10/14/2022    1:06 PM 05/17/2020    3:09 PM 12/22/2017    4:07 PM 02/07/2016    4:26 PM  PHQ 2/9 Scores  PHQ - 2 Score 0 0 0 0 0 0 1  PHQ- 9 Score   0  0  0        Data saved with a previous flowsheet row definition     Goals Addressed               This Visit's Progress     I'd like to finish the 1000 piece puzzle I'm working on. (pt-stated)         Visit info / Clinical Intake: Medicare Wellness Visit Type:: Initial Annual Wellness Visit Persons participating in visit:: patient & caregiver Medicare Wellness Visit Mode:: In-person (required for WTM) Information given by:: patient; family (mother, Vanessa Zuniga) Interpreter Needed?: No Pre-visit prep was completed: yes AWV questionnaire completed by patient prior to visit?: no Living arrangements:: with family/others Patient's Overall Health Status Rating: very good Typical amount of pain: none Does pain affect daily life?: no Are you currently prescribed opioids?: no  Dietary  Habits and Nutritional Risks How many meals a day?: 3 Eats fruit and vegetables daily?: yes Most meals are obtained by: preparing own meals; having others provide food In the last 2 weeks, have you had any of the following?: none Diabetic:: no  Functional Status Activities of Daily Living (to include ambulation/medication): Independent Ambulation: Independent Medication Administration: Needs assistance (comment) (mother makes sure she is taking meds correctly) Is this a change from baseline?: Pre-admission baseline Home Management: Needs assistance (comment) (mother helps) Manage your own finances?: (!) no Primary transportation is: family/friends Concerns about vision?: no *vision screening is  required for WTM* Concerns about hearing?: no  Fall Screening Falls in the past year?: 1 (when she has a seizure. last seizure Oct 2025 as of todays visit.) Number of falls in past year: 1 Was there an injury with Fall?: 0 Fall Risk Category Calculator: 2 Patient Fall Risk Level: Moderate Fall Risk  Fall Risk Patient at Risk for Falls Due to: Other (Comment) (seizures) Fall risk Follow up: Falls evaluation completed; Education provided; Falls prevention discussed  Home and Transportation Safety: All rugs have non-skid backing?: yes All stairs or steps have railings?: yes Grab bars in the bathtub or shower?: yes Have non-skid surface in bathtub or shower?: yes Good home lighting?: yes Regular seat belt use?: yes Hospital stays in the last year:: no  Cognitive Assessment Difficulty concentrating, remembering, or making decisions? : no Will 6CIT or Mini Cog be Completed: no 6CIT or Mini Cog Declined: patient alert, oriented, able to answer questions appropriately and recall recent events  Advance Directives (For Healthcare) Does Patient Have a Medical Advance Directive?: No Does patient want to make changes to medical advance directive?: No - Patient declined Type of Advance Directive: Healthcare Power of Attorney Copy of Healthcare Power of Attorney in Chart?: No - copy requested Would patient like information on creating a medical advance directive?: No - Patient declined  Reviewed/Updated  Reviewed/Updated: Reviewed All (Medical, Surgical, Family, Medications, Allergies, Care Teams, Patient Goals)        Objective:    Today's Vitals   11/24/23 1526  BP: 102/70  Pulse: 75  Resp: 12  SpO2: 100%  Weight: 173 lb 1.9 oz (78.5 kg)  Height: 5' 4 (1.626 m)   Body mass index is 29.72 kg/m.  Current Medications (verified) Outpatient Encounter Medications as of 11/24/2023  Medication Sig   acetaminophen  (TYLENOL ) 500 MG tablet Take 1,000 mg by mouth every 6 (six) hours  as needed for moderate pain or headache.   cloBAZam  (ONFI ) 10 MG tablet Take 0.5 tablets (5 mg total) by mouth every morning AND 1 tablet (10 mg total) at bedtime.   ferrous sulfate (SLOW IRON) 160 (50 Fe) MG TBCR SR tablet Take 1 tablet by mouth daily.   levETIRAcetam  (KEPPRA ) 1000 MG tablet TAKE TWO TABLETS (2000MG  TOTAL) BY MOUTHTWO TIMES DAILY   Multiple Vitamin (MULTIVITAMIN) tablet Take 1 tablet by mouth daily.   ursodiol (ACTIGALL) 300 MG capsule Take 300 mg by mouth 2 (two) times daily.   No facility-administered encounter medications on file as of 11/24/2023.   Hearing/Vision screen Hearing Screening - Comments:: Patient denies any hearing difficulties.   Vision Screening - Comments:: Patient is up to date on yearly eye exams, however, patient doesn't have a regular eye care provider. Patient provided with a list of eye doctors. Immunizations and Health Maintenance Health Maintenance  Topic Date Due   Pneumococcal Vaccine (1 of 2 - PCV) Never done   HPV  VACCINES (1 - 3-dose SCDM series) Never done   DTaP/Tdap/Td (2 - Td or Tdap) 10/15/2022   Medicare Annual Wellness (AWV)  11/23/2024   Cervical Cancer Screening (HPV/Pap Cotest)  05/18/2028   Influenza Vaccine  Completed   Hepatitis B Vaccines 19-59 Average Risk  Completed   COVID-19 Vaccine  Completed   Hepatitis C Screening  Completed   HIV Screening  Completed   Meningococcal B Vaccine  Aged Out        Assessment/Plan:  This is a routine wellness examination for Vanessa Zuniga.  Patient Care Team: Bacchus, Meade PEDLAR, FNP as PCP - General (Family Medicine) Gregg Lek, MD as Consulting Physician (Neurology) Hays Morrison, CRNP as Nurse Practitioner (Transplant Hepatology) Signa Delon LABOR, NP as Nurse Practitioner (Obstetrics and Gynecology) Lanis Pupa, MD as Consulting Physician (Neurosurgery)  I have personally reviewed and noted the following in the patient's chart:   Medical and social history Use of  alcohol, tobacco or illicit drugs  Current medications and supplements including opioid prescriptions. Functional ability and status Nutritional status Physical activity Advanced directives List of other physicians Hospitalizations, surgeries, and ER visits in previous 12 months Vitals Screenings to include cognitive, depression, and falls Referrals and appointments  No orders of the defined types were placed in this encounter.  In addition, I have reviewed and discussed with patient certain preventive protocols, quality metrics, and best practice recommendations. A written personalized care plan for preventive services as well as general preventive health recommendations were provided to patient.   Alexya Mcdaris, CMA   11/24/2023   Return on November 30, 2024 at 3:10 pm for your yearly Medicare Wellness Visit in office.  After Visit Summary: (In Person-Printed) AVS printed and given to the patient  Nurse Notes: n/a

## 2023-11-26 ENCOUNTER — Other Ambulatory Visit (HOSPITAL_BASED_OUTPATIENT_CLINIC_OR_DEPARTMENT_OTHER): Payer: Self-pay

## 2023-11-26 MED ORDER — BOOSTRIX 5-2.5-18.5 LF-MCG/0.5 IM SUSY
0.5000 mL | PREFILLED_SYRINGE | INTRAMUSCULAR | 0 refills | Status: AC
  Filled 2023-11-26: qty 0.5, 1d supply, fill #0

## 2023-12-03 ENCOUNTER — Telehealth: Payer: Self-pay | Admitting: Neurology

## 2023-12-03 NOTE — Telephone Encounter (Signed)
 Pt's mother reports EMS just left her home as a result of pt just having another seizure.  Mother reports pt seems to have a seizure once a month now, please call to discuss.

## 2023-12-04 ENCOUNTER — Other Ambulatory Visit: Payer: Self-pay | Admitting: Neurology

## 2023-12-04 MED ORDER — TOPIRAMATE 50 MG PO TABS
50.0000 mg | ORAL_TABLET | Freq: Two times a day (BID) | ORAL | 6 refills | Status: AC
Start: 2023-12-04 — End: ?

## 2023-12-04 NOTE — Telephone Encounter (Signed)
 Called pt's mother back and let her know that Dr. Gregg sent in Topiramate  50mg  2x daily in the pt's pharmacy to tyr and control pt's seizures being that pt responded well with the Topiramate  50mg .

## 2023-12-04 NOTE — Telephone Encounter (Signed)
 Please call and advise family that I will restart Vanessa Zuniga on Topiramate , 50 mg twice daily. Her seizures were well controlled while on Topiramate .

## 2023-12-04 NOTE — Telephone Encounter (Signed)
 Pt last saw Dr. Gregg 02/23/23. Next f/u: 02/23/24.   I called mother (on [place]) at [phone].  No further seizure episodes after EMS left yesterday. Did not go to ED. She was confused post seizure. She rested and ok afterwards. Denies any missed doses of seizure meds. Confirmed she is taking Keppra  2000mg  po BID and Clobazam  5mg  in the am and 10mg  po q pm. No missed swipes of VNS. Does three times daily. If feeling off, she swipes. No infection/illness currently.  Concerned about monthly seizure events. Aware I will send to Dr. Gregg and we will reach back out with recommendation.

## 2024-01-18 ENCOUNTER — Telehealth: Payer: Medicare (Managed Care) | Admitting: Family Medicine

## 2024-01-18 DIAGNOSIS — Z8669 Personal history of other diseases of the nervous system and sense organs: Secondary | ICD-10-CM

## 2024-01-18 DIAGNOSIS — Z87898 Personal history of other specified conditions: Secondary | ICD-10-CM

## 2024-01-18 NOTE — Progress Notes (Signed)
" ° °  Virtual Visit via Video Note  I connected with Vanessa Zuniga on 01/18/2024 at  1:40 PM EST by a video enabled telemedicine application and verified that I am speaking with the correct person using two identifiers.  Patient Location: Home Provider Location: Home Office  I discussed the limitations, risks, security, and privacy concerns of performing an evaluation and management service by video and the availability of in person appointments. I also discussed with the patient that there may be a patient responsible charge related to this service. The patient expressed understanding and agreed to proceed.  Subjective: PCP: Edman Meade PEDLAR, FNP  Chief Complaint  Patient presents with   Medical Management of Chronic Issues    Six month follow up    HPI The patient presents today for management of chronic conditions. No complaints or concerns were voiced at this time.   ROS: Per HPI Current Medications[1]  Observations/Objective: There were no vitals filed for this visit. Physical Exam Patient is well-developed, well-nourished in no acute distress.  Resting comfortably at home.  Head is normocephalic, atraumatic.  No labored breathing.  Speech is clear and coherent with logical content.  Patient is alert and oriented at baseline.   Assessment and Plan: History of seizures   Encouraged to continue treatment regimen as is  Will assess labs at her next visit Follow Up Instructions: No follow-ups on file.   I discussed the assessment and treatment plan with the patient. The patient was provided an opportunity to ask questions, and all were answered. The patient agreed with the plan and demonstrated an understanding of the instructions.   The patient was advised to call back or seek an in-person evaluation if the symptoms worsen or if the condition fails to improve as anticipated.  The above assessment and management plan was discussed with the patient. The patient verbalized  understanding of and has agreed to the management plan.   Vanessa Zuniga  Z Bacchus, FNP     [1]  Current Outpatient Medications:    acetaminophen  (TYLENOL ) 500 MG tablet, Take 1,000 mg by mouth every 6 (six) hours as needed for moderate pain or headache., Disp: , Rfl:    cloBAZam  (ONFI ) 10 MG tablet, Take 0.5 tablets (5 mg total) by mouth every morning AND 1 tablet (10 mg total) at bedtime., Disp: 45 tablet, Rfl: 5   ferrous sulfate (SLOW IRON) 160 (50 Fe) MG TBCR SR tablet, Take 1 tablet by mouth daily., Disp: , Rfl:    levETIRAcetam  (KEPPRA ) 1000 MG tablet, TAKE TWO TABLETS (2000MG  TOTAL) BY MOUTHTWO TIMES DAILY, Disp: 120 tablet, Rfl: 11   Multiple Vitamin (MULTIVITAMIN) tablet, Take 1 tablet by mouth daily., Disp: , Rfl:    Tdap (BOOSTRIX ) 5-2.5-18.5 LF-MCG/0.5 injection, Inject 0.5 mLs into the muscle., Disp: 0.5 mL, Rfl: 0   topiramate  (TOPAMAX ) 50 MG tablet, Take 1 tablet (50 mg total) by mouth 2 (two) times daily., Disp: 60 tablet, Rfl: 6   ursodiol (ACTIGALL) 300 MG capsule, Take 300 mg by mouth 2 (two) times daily., Disp: , Rfl:   "

## 2024-02-23 ENCOUNTER — Ambulatory Visit: Payer: Medicare Other | Admitting: Neurology

## 2024-11-30 ENCOUNTER — Ambulatory Visit
